# Patient Record
Sex: Female | Born: 1970
Health system: Southern US, Community
[De-identification: ages and names within clinical notes are randomized; demographics above are authoritative.]

## PROBLEM LIST (undated history)

## (undated) DIAGNOSIS — K8020 Calculus of gallbladder without cholecystitis without obstruction: Secondary | ICD-10-CM

## (undated) DIAGNOSIS — F419 Anxiety disorder, unspecified: Secondary | ICD-10-CM

## (undated) DIAGNOSIS — E785 Hyperlipidemia, unspecified: Secondary | ICD-10-CM

## (undated) DIAGNOSIS — F32A Depression, unspecified: Secondary | ICD-10-CM

## (undated) DIAGNOSIS — M1990 Unspecified osteoarthritis, unspecified site: Secondary | ICD-10-CM

## (undated) DIAGNOSIS — D649 Anemia, unspecified: Secondary | ICD-10-CM

## (undated) DIAGNOSIS — K219 Gastro-esophageal reflux disease without esophagitis: Secondary | ICD-10-CM

## (undated) DIAGNOSIS — I10 Essential (primary) hypertension: Secondary | ICD-10-CM

## (undated) DIAGNOSIS — T7840XA Allergy, unspecified, initial encounter: Secondary | ICD-10-CM

## (undated) DIAGNOSIS — R569 Unspecified convulsions: Secondary | ICD-10-CM

## (undated) HISTORY — DX: Unspecified osteoarthritis, unspecified site: M19.90

## (undated) HISTORY — DX: Gastro-esophageal reflux disease without esophagitis: K21.9

## (undated) HISTORY — DX: Hyperlipidemia, unspecified: E78.5

## (undated) HISTORY — DX: Anxiety disorder, unspecified: F41.9

## (undated) HISTORY — PX: WISDOM TOOTH EXTRACTION: SHX21

## (undated) HISTORY — PX: CHOLECYSTECTOMY: SHX55

## (undated) HISTORY — PX: TUBAL LIGATION: SHX77

## (undated) HISTORY — DX: Allergy, unspecified, initial encounter: T78.40XA

## (undated) HISTORY — DX: Depression, unspecified: F32.A

## (undated) HISTORY — DX: Unspecified convulsions: R56.9

---

## 1996-04-02 DIAGNOSIS — K8020 Calculus of gallbladder without cholecystitis without obstruction: Secondary | ICD-10-CM

## 1996-04-02 HISTORY — DX: Calculus of gallbladder without cholecystitis without obstruction: K80.20

## 2001-07-11 ENCOUNTER — Emergency Department (HOSPITAL_COMMUNITY): Admission: EM | Admit: 2001-07-11 | Discharge: 2001-07-11 | Payer: Self-pay | Admitting: Emergency Medicine

## 2001-11-02 ENCOUNTER — Emergency Department (HOSPITAL_COMMUNITY): Admission: EM | Admit: 2001-11-02 | Discharge: 2001-11-02 | Payer: Self-pay | Admitting: Emergency Medicine

## 2002-06-06 ENCOUNTER — Emergency Department (HOSPITAL_COMMUNITY): Admission: EM | Admit: 2002-06-06 | Discharge: 2002-06-06 | Payer: Self-pay | Admitting: Emergency Medicine

## 2002-08-22 ENCOUNTER — Emergency Department (HOSPITAL_COMMUNITY): Admission: EM | Admit: 2002-08-22 | Discharge: 2002-08-22 | Payer: Self-pay | Admitting: Emergency Medicine

## 2002-08-22 ENCOUNTER — Encounter: Payer: Self-pay | Admitting: Emergency Medicine

## 2003-04-28 ENCOUNTER — Emergency Department (HOSPITAL_COMMUNITY): Admission: EM | Admit: 2003-04-28 | Discharge: 2003-04-28 | Payer: Self-pay | Admitting: Emergency Medicine

## 2005-02-02 ENCOUNTER — Emergency Department (HOSPITAL_COMMUNITY): Admission: EM | Admit: 2005-02-02 | Discharge: 2005-02-02 | Payer: Self-pay | Admitting: Emergency Medicine

## 2005-09-05 ENCOUNTER — Emergency Department (HOSPITAL_COMMUNITY): Admission: EM | Admit: 2005-09-05 | Discharge: 2005-09-05 | Payer: Self-pay | Admitting: Emergency Medicine

## 2006-02-06 ENCOUNTER — Emergency Department (HOSPITAL_COMMUNITY): Admission: EM | Admit: 2006-02-06 | Discharge: 2006-02-06 | Payer: Self-pay | Admitting: Emergency Medicine

## 2006-08-13 ENCOUNTER — Emergency Department (HOSPITAL_COMMUNITY): Admission: EM | Admit: 2006-08-13 | Discharge: 2006-08-13 | Payer: Self-pay | Admitting: Family Medicine

## 2006-12-23 ENCOUNTER — Emergency Department (HOSPITAL_COMMUNITY): Admission: EM | Admit: 2006-12-23 | Discharge: 2006-12-23 | Payer: Self-pay | Admitting: Emergency Medicine

## 2006-12-24 ENCOUNTER — Emergency Department (HOSPITAL_COMMUNITY): Admission: EM | Admit: 2006-12-24 | Discharge: 2006-12-25 | Payer: Self-pay | Admitting: Emergency Medicine

## 2009-07-19 ENCOUNTER — Emergency Department (HOSPITAL_COMMUNITY): Admission: EM | Admit: 2009-07-19 | Discharge: 2009-07-19 | Payer: Self-pay | Admitting: Family Medicine

## 2009-07-20 ENCOUNTER — Encounter: Admission: RE | Admit: 2009-07-20 | Discharge: 2009-07-20 | Payer: Self-pay | Admitting: Internal Medicine

## 2011-01-11 LAB — I-STAT 8, (EC8 V) (CONVERTED LAB)
Acid-base deficit: 1
BUN: 12
Bicarbonate: 23.8
Chloride: 106
Glucose, Bld: 104 — ABNORMAL HIGH
HCT: 32 — ABNORMAL LOW
Hemoglobin: 10.9 — ABNORMAL LOW
Operator id: 277751
Potassium: 3.6
Sodium: 140
TCO2: 25
pCO2, Ven: 38.5 — ABNORMAL LOW
pH, Ven: 7.399 — ABNORMAL HIGH

## 2011-01-11 LAB — DIFFERENTIAL
Basophils Absolute: 0
Basophils Relative: 0
Eosinophils Absolute: 0.1
Eosinophils Relative: 2
Lymphocytes Relative: 24
Lymphs Abs: 1.7
Monocytes Absolute: 0.4
Monocytes Relative: 6
Neutro Abs: 4.9
Neutrophils Relative %: 68

## 2011-01-11 LAB — CBC
HCT: 29.6 — ABNORMAL LOW
Hemoglobin: 9.6 — ABNORMAL LOW
MCHC: 32.4
MCV: 81
Platelets: 241
RBC: 3.65 — ABNORMAL LOW
RDW: 14.9 — ABNORMAL HIGH
WBC: 7.2

## 2011-01-11 LAB — POCT I-STAT CREATININE
Creatinine, Ser: 0.9
Operator id: 277751

## 2011-03-08 ENCOUNTER — Emergency Department (HOSPITAL_COMMUNITY)
Admission: EM | Admit: 2011-03-08 | Discharge: 2011-03-09 | Disposition: A | Discharge status: Home / Self Care | Payer: Self-pay | Attending: Emergency Medicine | Admitting: Emergency Medicine

## 2011-03-08 DIAGNOSIS — R110 Nausea: Secondary | ICD-10-CM | POA: Insufficient documentation

## 2011-03-08 DIAGNOSIS — R059 Cough, unspecified: Secondary | ICD-10-CM | POA: Insufficient documentation

## 2011-03-08 DIAGNOSIS — IMO0001 Reserved for inherently not codable concepts without codable children: Secondary | ICD-10-CM | POA: Insufficient documentation

## 2011-03-08 DIAGNOSIS — F17200 Nicotine dependence, unspecified, uncomplicated: Secondary | ICD-10-CM | POA: Insufficient documentation

## 2011-03-08 DIAGNOSIS — R05 Cough: Secondary | ICD-10-CM | POA: Insufficient documentation

## 2011-03-08 DIAGNOSIS — J3489 Other specified disorders of nose and nasal sinuses: Secondary | ICD-10-CM | POA: Insufficient documentation

## 2011-03-08 DIAGNOSIS — R509 Fever, unspecified: Secondary | ICD-10-CM | POA: Insufficient documentation

## 2011-03-08 DIAGNOSIS — B9789 Other viral agents as the cause of diseases classified elsewhere: Secondary | ICD-10-CM | POA: Insufficient documentation

## 2011-03-08 DIAGNOSIS — H9209 Otalgia, unspecified ear: Secondary | ICD-10-CM | POA: Insufficient documentation

## 2011-03-08 DIAGNOSIS — R51 Headache: Secondary | ICD-10-CM | POA: Insufficient documentation

## 2011-03-08 DIAGNOSIS — B349 Viral infection, unspecified: Secondary | ICD-10-CM

## 2011-03-09 ENCOUNTER — Emergency Department (HOSPITAL_COMMUNITY): Payer: Self-pay

## 2011-03-09 ENCOUNTER — Encounter: Payer: Self-pay | Admitting: Emergency Medicine

## 2011-03-09 LAB — RAPID STREP SCREEN (MED CTR MEBANE ONLY): Streptococcus, Group A Screen (Direct): NEGATIVE

## 2011-03-09 LAB — D-DIMER, QUANTITATIVE: D-Dimer, Quant: 0.27 ug/mL-FEU (ref 0.00–0.48)

## 2011-03-09 MED ORDER — ACETAMINOPHEN 325 MG PO TABS
650.0000 mg | ORAL_TABLET | Freq: Once | ORAL | Status: AC
  Administered 2011-03-09: 650 mg via ORAL
  Filled 2011-03-09: qty 2

## 2011-03-09 MED ORDER — KETOROLAC TROMETHAMINE 60 MG/2ML IM SOLN
60.0000 mg | Freq: Once | INTRAMUSCULAR | Status: AC
  Administered 2011-03-09: 60 mg via INTRAMUSCULAR
  Filled 2011-03-09: qty 2

## 2011-03-09 NOTE — ED Provider Notes (Signed)
History     CSN: [id number] Arrival date & time: 03/08/2011 11:57 PM   First MD Initiated Contact with Patient 03/09/11 [phone]      Chief Complaint  Patient presents with  . Headache    (Consider location/radiation/quality/duration/timing/severity/associated sxs/prior treatment) HPI Comments: Patient presents with one day of body aches, headache, fever to 102, chills, bilateral ear pain, cough and congestion. The headache is gradual in onset and similar to previous headaches.  She denies any weakness, numbness or tingling. She did not get a flu shot.   She's had some mild nausea but no vomiting no abdominal pain, chest pain, shortness of breath. She denies any other medical problems.  The history is provided by the patient.    History reviewed. No pertinent past medical history.  History reviewed. No pertinent past surgical history.  No family history on file.  History  Substance Use Topics  . Smoking status: Current Everyday Smoker  . Smokeless tobacco: Not on file  . Alcohol Use: Yes    OB History    Grav Para Term Preterm Abortions TAB SAB Ect Mult Living                  Review of Systems  Constitutional: Positive for fever, activity change, appetite change and fatigue.  HENT: Positive for congestion, sore throat and rhinorrhea. Negative for neck pain and neck stiffness.   Eyes: Negative for visual disturbance.  Respiratory: Positive for cough. Negative for chest tightness and shortness of breath.   Cardiovascular: Negative for chest pain.  Gastrointestinal: Positive for nausea. Negative for abdominal pain.  Genitourinary: Negative for dysuria and hematuria.  Musculoskeletal: Positive for myalgias and arthralgias.  Neurological: Positive for headaches. Negative for dizziness, seizures and weakness.    Allergies  Review of patient's allergies indicates no known allergies.  Home Medications  No current outpatient prescriptions on file.  BP 113/75  Pulse 83   Temp(Src) 97.6 F (36.4 C) (Oral)  Resp 16  SpO2 98%  LMP 02/15/2011  Physical Exam  Constitutional: She is oriented to person, place, and time. She appears well-developed and well-nourished. No distress.  HENT:  Head: Normocephalic.  Right Ear: External ear normal.  Left Ear: External ear normal.  Mouth/Throat: Oropharynx is clear and moist. No oropharyngeal exudate.  Eyes: Conjunctivae are normal. Pupils are equal, round, and reactive to light.  Neck: Normal range of motion. Neck supple.       No meningismus  Cardiovascular: Normal rate, regular rhythm and normal heart sounds.   Pulmonary/Chest: Effort normal and breath sounds normal. No respiratory distress.  Abdominal: Soft. There is no tenderness. There is no rebound and no guarding.  Musculoskeletal: Normal range of motion. She exhibits no edema and no tenderness.  Neurological: She is alert and oriented to person, place, and time. No cranial nerve deficit.  Skin: Skin is warm.    ED Course  Procedures (including critical care time)   Labs Reviewed  RAPID STREP SCREEN  D-DIMER, QUANTITATIVE   Dg Chest 2 View  03/09/2011  *RADIOLOGY REPORT*  Clinical Data: Cough, headache, body aches, and chest pain and burning.  CHEST - 2 VIEW  Comparison: Chest radiograph performed 02/02/2005  Findings: The lungs are well-aerated and clear.  There is no evidence of focal opacification, pleural effusion or pneumothorax.  The heart is normal in size; the mediastinal contour is within normal limits.  No acute osseous abnormalities are seen.  Clips are noted within the right upper quadrant, reflecting prior cholecystectomy.  IMPRESSION: No acute cardiopulmonary process seen.  Original Report Authenticated By: Tonia Ghent, M.D.     1. Viral syndrome       MDM  Cough, congestion, body aches and headaches.  Nontoxic on exam with normal neurological exam and supple neck. Likely viral syndrome. We'll check d-dimer given tachycardia.  On  reassessment headaches and bodyaches improved. Normal neurological exam. We'll treat as viral syndrome with supportive care.  Heart rate and temperature improved with antipyretics. d-dimer negative  Glynn Octave, MD 03/09/11 [phone]

## 2011-03-09 NOTE — ED Notes (Signed)
PT. REPORTS HEADACHE , BILATERAL EAR ACHE , BODY ACHES AND PAIN ONSET TODAY WITH CHILLS AND FEVER.

## 2011-08-06 ENCOUNTER — Encounter (HOSPITAL_COMMUNITY): Payer: Self-pay | Admitting: Emergency Medicine

## 2011-08-06 ENCOUNTER — Emergency Department (HOSPITAL_COMMUNITY)
Admission: EM | Admit: 2011-08-06 | Discharge: 2011-08-07 | Disposition: A | Discharge status: Home / Self Care | Payer: BC Managed Care – PPO | Source: Ambulatory Visit | Attending: Emergency Medicine | Admitting: Emergency Medicine

## 2011-08-06 DIAGNOSIS — M79609 Pain in unspecified limb: Secondary | ICD-10-CM | POA: Insufficient documentation

## 2011-08-06 DIAGNOSIS — M79605 Pain in left leg: Secondary | ICD-10-CM

## 2011-08-06 LAB — CBC
HCT: 30.9 % — ABNORMAL LOW (ref 36.0–46.0)
Hemoglobin: 9.3 g/dL — ABNORMAL LOW (ref 12.0–15.0)
MCH: 21.6 pg — ABNORMAL LOW (ref 26.0–34.0)
MCHC: 30.1 g/dL (ref 30.0–36.0)
MCV: 71.7 fL — ABNORMAL LOW (ref 78.0–100.0)
Platelets: 267 10*3/uL (ref 150–400)
RBC: 4.31 MIL/uL (ref 3.87–5.11)
RDW: 18.2 % — ABNORMAL HIGH (ref 11.5–15.5)
WBC: 8.4 10*3/uL (ref 4.0–10.5)

## 2011-08-06 LAB — BASIC METABOLIC PANEL
CO2: 26 mEq/L (ref 19–32)
Calcium: 9.3 mg/dL (ref 8.4–10.5)
Chloride: 103 mEq/L (ref 96–112)
Creatinine, Ser: 0.93 mg/dL (ref 0.50–1.10)
Glucose, Bld: 93 mg/dL (ref 70–99)

## 2011-08-06 LAB — PROTIME-INR
INR: 0.99 (ref 0.00–1.49)
Prothrombin Time: 13.3 seconds (ref 11.6–15.2)

## 2011-08-06 MED ORDER — OXYCODONE-ACETAMINOPHEN 5-325 MG PO TABS: 2.0000 | ORAL_TABLET | Freq: Four times a day (QID) | ORAL | Status: DC | PRN

## 2011-08-06 MED ORDER — KETOROLAC TROMETHAMINE 30 MG/ML IJ SOLN
30.0000 mg | Freq: Once | INTRAMUSCULAR | Status: AC
  Administered 2011-08-06: 30 mg via INTRAVENOUS
  Filled 2011-08-06: qty 1

## 2011-08-06 MED ORDER — ACETAMINOPHEN 325 MG PO TABS: 650.0000 mg | ORAL_TABLET | ORAL | Status: DC | PRN

## 2011-08-06 MED ORDER — ZOLPIDEM TARTRATE 5 MG PO TABS: 5.0000 mg | ORAL_TABLET | Freq: Every evening | ORAL | Status: DC | PRN

## 2011-08-06 MED ORDER — DEXAMETHASONE SODIUM PHOSPHATE 10 MG/ML IJ SOLN
10.0000 mg | Freq: Once | INTRAMUSCULAR | Status: AC
  Administered 2011-08-06: 10 mg via INTRAVENOUS
  Filled 2011-08-06: qty 1

## 2011-08-06 MED ORDER — ONDANSETRON HCL 4 MG/2ML IJ SOLN
4.0000 mg | Freq: Four times a day (QID) | INTRAMUSCULAR | Status: DC | PRN
  Administered 2011-08-06: 4 mg via INTRAVENOUS
  Filled 2011-08-06: qty 2

## 2011-08-06 MED ORDER — ENOXAPARIN SODIUM 100 MG/ML ~~LOC~~ SOLN
1.0000 mg/kg | SUBCUTANEOUS | Status: AC
  Administered 2011-08-07: 95 mg via SUBCUTANEOUS
  Filled 2011-08-06: qty 1

## 2011-08-06 MED ORDER — MORPHINE SULFATE 4 MG/ML IJ SOLN: 4.0000 mg | INTRAMUSCULAR | Status: DC | PRN

## 2011-08-06 NOTE — ED Provider Notes (Signed)
History     CSN: [id number]  Arrival date & time 08/06/11  2013   First MD Initiated Contact with Patient 08/06/11 2305      Chief Complaint  Patient presents with  . Leg Pain    (Consider location/radiation/quality/duration/timing/severity/associated sxs/prior treatment) HPI Comments: Pt presents with 2 weeks of LLE pain that started as her lower back but has become solely leg pain.  The pain is constant, moderate, worse with standing and not associated with red flags for pathological back pain.  She has no numbness in the feet, no retention, no fever, no ca and pain is relieved some when she is off of her feet.  No hx of back pain, no inciting event.  Patient is a 41 y.o. female presenting with leg pain. The history is provided by the patient and the spouse.  Leg Pain  Pertinent negatives include no numbness.    History reviewed. No pertinent past medical history.  History reviewed. No pertinent past surgical history.  No family history on file.  History  Substance Use Topics  . Smoking status: Current Everyday Smoker  . Smokeless tobacco: Not on file  . Alcohol Use: Yes    OB History    Grav Para Term Preterm Abortions TAB SAB Ect Mult Living                  Review of Systems  Constitutional: Negative for fever and chills.  HENT: Negative for sore throat and neck pain.   Eyes: Negative for visual disturbance.  Respiratory: Negative for cough and shortness of breath.   Cardiovascular: Positive for leg swelling. Negative for chest pain.  Gastrointestinal: Negative for nausea, vomiting, abdominal pain and diarrhea.  Genitourinary: Negative for dysuria and frequency.  Musculoskeletal: Positive for back pain.  Skin: Negative for rash.  Neurological: Negative for weakness, numbness and headaches.  Hematological: Negative for adenopathy.  Psychiatric/Behavioral: Negative for behavioral problems.    Allergies  Shellfish allergy and Shellfish-derived  products  Home Medications   Current Outpatient Rx  Name Route Sig Dispense Refill  . IBUPROFEN 800 MG PO TABS Oral Take 1 tablet (800 mg total) by mouth 3 (three) times daily. 21 tablet 0  . PREDNISONE 10 MG PO TABS Oral Take 2 tablets (20 mg total) by mouth daily. 15 tablet 0    BP 115/68  Pulse 78  Temp(Src) 98.2 F (36.8 C) (Oral)  Resp 16  Ht 5\' 7"  (1.702 m)  Wt 206 lb (93.441 kg)  BMI 32.26 kg/m2  SpO2 99%  LMP 07/30/2011  Physical Exam  Nursing note and vitals reviewed. Constitutional: She appears well-developed and well-nourished. No distress.  HENT:  Head: Normocephalic and atraumatic.  Mouth/Throat: Oropharynx is clear and moist. No oropharyngeal exudate.  Eyes: Conjunctivae and EOM are normal. Pupils are equal, round, and reactive to light. Right eye exhibits no discharge. Left eye exhibits no discharge. No scleral icterus.  Neck: Normal range of motion. Neck supple. No JVD present. No thyromegaly present.  Cardiovascular: Normal rate, regular rhythm, normal heart sounds and intact distal pulses.  Exam reveals no gallop and no friction rub.   No murmur heard. Pulmonary/Chest: Effort normal and breath sounds normal. No respiratory distress. She has no wheezes. She has no rales.  Abdominal: Soft. Bowel sounds are normal. She exhibits no distension and no mass. There is no tenderness.  Musculoskeletal: Normal range of motion. She exhibits no edema and no tenderness.       Pain with extension of  the left lower extremity, pain with flexion at the hip with the leg in extension, relief with flexion of the knee  Lymphadenopathy:    She has no cervical adenopathy.  Neurological: She is alert. Coordination normal.       Neurologic exam:  Speech clear, pupils equal round reactive to light, extraocular movements intact  Normal peripheral visual fields Cranial nerves III through XII normal including no facial droop Follows commands, moves all extremities x4, normal strength  to bilateral upper and lower extremities at all major muscle groups including grip Sensation normal to light touch and pinprick Coordination intact, no limb ataxia, finger-nose-finger normal Rapid alternating movements normal No pronator drift Gait normal   Skin: Skin is warm and dry. No rash noted. No erythema.  Psychiatric: She has a normal mood and affect. Her behavior is normal.    ED Course  Procedures (including critical care time)  Labs Reviewed  CBC - Abnormal; Notable for the following:    Hemoglobin 9.3 (*)    HCT 30.9 (*)    MCV 71.7 (*)    MCH 21.6 (*)    RDW 18.2 (*)    All other components within normal limits  BASIC METABOLIC PANEL - Abnormal; Notable for the following:    GFR calc non Af Amer 76 (*)    GFR calc Af Amer 89 (*)    All other components within normal limits  PROTIME-INR  APTT   No results found.   1. Leg pain, left       MDM  Pt has some lower extremity pain but no signs of neurological deficit and no pathological red flags.  She has been given pain medication.  She has unilateral sx and no real back pain at  This time and no buttock pain, some concern exists for DVT given the 2 weeks of progressive pain with subjective swellign of the thigh (I don't see any and there is no edema or asymetry on my exam).  Doppler in the AM, CDU protocol initiated.   The pt was given opiate type medications while in the emergency dept.  The patient was instructed on the possible side effects and potential allergic reactions associated with said medications and agreed to their use.  I also instructed the patient not to perform certain activities after use of these medications such as driving a vehicle and performing child care.  They were instructed not to ingest alcohol or other medications that may cause excessive sleepiness, tranquilizers or CNS depressant medications.  They have expressed their understanding.  If the pt was given opiate medications for home by  prescription they were reminded of these precautions as well.  Change of shift - care signed out to Fayrene Helper PA-C and Dr. Maree Erie, MD 08/07/11 2337

## 2011-08-06 NOTE — ED Notes (Signed)
PT. REPORTS LEFT HIP , LEFT LEG , LOW BACK PAIN FOR 2 WEEKS , DENIES INJURY OR FALL , AMBULATORY.

## 2011-08-06 NOTE — ED Notes (Signed)
Message left at vascular lab for testing in a.m.

## 2011-08-07 DIAGNOSIS — M79609 Pain in unspecified limb: Secondary | ICD-10-CM

## 2011-08-07 DIAGNOSIS — M7989 Other specified soft tissue disorders: Secondary | ICD-10-CM

## 2011-08-07 MED ORDER — PREDNISONE 10 MG PO TABS: 20.0000 mg | ORAL_TABLET | Freq: Every day | ORAL | Status: DC

## 2011-08-07 MED ORDER — IBUPROFEN 800 MG PO TABS: 800.0000 mg | ORAL_TABLET | Freq: Three times a day (TID) | ORAL | Status: AC

## 2011-08-07 NOTE — Discharge Instructions (Signed)

## 2011-08-07 NOTE — ED Notes (Signed)
Pt. Alert and oriented, NAD noted 

## 2011-08-07 NOTE — ED Notes (Signed)
Patient transported to Venous Doppler.

## 2011-08-07 NOTE — ED Provider Notes (Signed)
Assumed patient care at 35:73 AM.  41 year old female presents with 2 weeks of left lower extremity pain. There were some concern exists for DVT.  DVT protocol initiated in the CDU. Patient currently awaits venous Doppler study this a.m.  9:46 AM Patient Information       Patient Name  Sex  DOB  SSN    Erika Larson, Erika Larson  Female  1970-06-16  [id number]             Progress Notes signed by Glendale Chard at 08/07/11 4098     Author:  Glendale Chard  Service:  (none)  Author Type:  Sonographer   Filed:  08/07/11 0824  Note Time:  08/07/11 0823          *PRELIMINARY RESULTS*  Vascular Ultrasound  Left lower extremity venous duplex has been completed. Preliminary findings: Left= No evidence of DVT or baker's cyst.  [name], RDMS  08/07/2011, 8:23 AM   Doppler study negative for DVT.  VSS.  Pt able to ambulate.  No signs of infection.  Plan to d/c with steroid taper course, and with NSAIDs.  Pt voice understanding.  Referral given.           Fayrene Helper, PA-C 08/07/11 [phone]

## 2011-08-07 NOTE — Progress Notes (Signed)
*  PRELIMINARY RESULTS* Vascular Ultrasound Left lower extremity venous duplex has been completed.  Preliminary findings: Left= No evidence of DVT or baker's cyst.  [name], RDMS 08/07/2011, 8:23 AM

## 2011-08-08 NOTE — Progress Notes (Signed)
Observation review is complete for 5/6 visit.

## 2012-10-13 ENCOUNTER — Other Ambulatory Visit: Payer: Self-pay | Admitting: *Deleted

## 2012-10-13 DIAGNOSIS — N6310 Unspecified lump in the right breast, unspecified quadrant: Secondary | ICD-10-CM

## 2012-10-14 ENCOUNTER — Ambulatory Visit (HOSPITAL_COMMUNITY): Payer: BC Managed Care – PPO

## 2012-10-15 ENCOUNTER — Telehealth (HOSPITAL_COMMUNITY): Payer: Self-pay | Admitting: *Deleted

## 2012-10-15 NOTE — Telephone Encounter (Signed)
Telephoned patient at home # and left message to return call to [facility] 

## 2012-10-22 ENCOUNTER — Other Ambulatory Visit: Payer: BC Managed Care – PPO

## 2012-12-19 ENCOUNTER — Encounter (HOSPITAL_COMMUNITY): Payer: Self-pay | Admitting: Emergency Medicine

## 2012-12-19 ENCOUNTER — Emergency Department (INDEPENDENT_AMBULATORY_CARE_PROVIDER_SITE_OTHER): Payer: Self-pay

## 2012-12-19 ENCOUNTER — Emergency Department (INDEPENDENT_AMBULATORY_CARE_PROVIDER_SITE_OTHER)
Admission: EM | Admit: 2012-12-19 | Discharge: 2012-12-19 | Disposition: A | Discharge status: Home / Self Care | Payer: Self-pay | Source: Home / Self Care | Attending: Family Medicine | Admitting: Family Medicine

## 2012-12-19 DIAGNOSIS — M47812 Spondylosis without myelopathy or radiculopathy, cervical region: Secondary | ICD-10-CM

## 2012-12-19 MED ORDER — CYCLOBENZAPRINE HCL 5 MG PO TABS: 5.0000 mg | ORAL_TABLET | Freq: Three times a day (TID) | ORAL | Status: DC | PRN

## 2012-12-19 MED ORDER — MELOXICAM 7.5 MG PO TABS: 7.5000 mg | ORAL_TABLET | Freq: Two times a day (BID) | ORAL | Status: DC

## 2012-12-19 NOTE — ED Provider Notes (Signed)
CSN: [id number]     Arrival date & time 12/19/12  1419 History   None    Chief Complaint  Patient presents with  . Shoulder Pain   (Consider location/radiation/quality/duration/timing/severity/associated sxs/prior Treatment) Patient is a 42 y.o. female presenting with shoulder pain. The history is provided by the patient.  Shoulder Pain This is a new problem. The current episode started more than 2 days ago. The problem has been gradually improving. Pertinent negatives include no chest pain and no abdominal pain. 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    History reviewed. No pertinent past medical history. History reviewed. No pertinent past surgical history. History reviewed. No pertinent family history. History  Substance Use Topics  . Smoking status: Current Every Day Smoker  . Smokeless tobacco: Not on file  . Alcohol Use: Yes   OB History   Grav Para Term Preterm Abortions TAB SAB Ect Mult Living                 Review of Systems  Constitutional: Negative.   HENT: Positive for neck pain.   Cardiovascular: Negative for chest pain.  Gastrointestinal: Negative for abdominal pain.  Musculoskeletal: Negative for back pain, joint swelling and gait problem.  Neurological: Negative for numbness.    Allergies  Shellfish allergy and Shellfish-derived products  Home Medications   Current Outpatient Rx  Name  Route  Sig  Dispense  Refill  . cyclobenzaprine (FLEXERIL) 5 MG tablet   Oral   Take 1 tablet (5 mg total) by mouth 3 (three) times daily as needed for muscle spasms.   30 tablet   0   . meloxicam (MOBIC) 7.5 MG tablet   Oral   Take 1 tablet (7.5 mg total) by mouth 2 (two) times daily after a meal.   60 tablet   0   . predniSONE (DELTASONE) 10 MG tablet   Oral   Take 2 tablets (20 mg total) by mouth daily.   15 tablet   0    BP 136/81  Pulse 73  Temp(Src) 97.9 F (36.6 C) (Oral)  Resp 20  SpO2 100%  LMP 11/21/2012 Physical Exam  Nursing  note and vitals reviewed. Constitutional: She is oriented to person, place, and time. She appears well-developed and well-nourished.  Neck: Muscular tenderness present. No spinous process tenderness present. No rigidity. Decreased range of motion present. No erythema present.    Neurological: She is alert and oriented to person, place, and time.  Skin: Skin is warm and dry.    ED Course  Procedures (including critical care time) Labs Review Labs Reviewed - No data to display Imaging Review Dg Cervical Spine Complete  12/19/2012   CLINICAL DATA:  Right shoulder pain  EXAM: CERVICAL SPINE  4+ VIEWS  COMPARISON:  None.  FINDINGS: Normal alignment of the cervical spine. The vertebral body heights are well preserved. Mild disc space narrowing and ventral endplate spurring is noted at C4-5 and C5-6. There is no fracture or subluxation identified. No radio-opaque foreign body or soft tissue calcifications.  IMPRESSION: 1. No acute findings.  2. Cervical spondylosis noted.   Electronically Signed   By: Signa Kell M.D.   On: 12/19/2012 16:28    MDM  X-rays reviewed and report per radiologist.     Linna Hoff, MD 12/19/12 1655

## 2012-12-19 NOTE — ED Notes (Signed)
C/o right shoulder pain since Wednesday.  Denies an injury.

## 2013-01-18 ENCOUNTER — Encounter (HOSPITAL_COMMUNITY): Payer: Self-pay | Admitting: Emergency Medicine

## 2013-01-18 ENCOUNTER — Emergency Department (INDEPENDENT_AMBULATORY_CARE_PROVIDER_SITE_OTHER)
Admission: EM | Admit: 2013-01-18 | Discharge: 2013-01-18 | Disposition: A | Discharge status: Home / Self Care | Payer: No Typology Code available for payment source | Source: Home / Self Care | Attending: Emergency Medicine | Admitting: Emergency Medicine

## 2013-01-18 DIAGNOSIS — K047 Periapical abscess without sinus: Secondary | ICD-10-CM

## 2013-01-18 MED ORDER — HYDROCODONE-ACETAMINOPHEN 5-325 MG PO TABS
2.0000 | ORAL_TABLET | Freq: Once | ORAL | Status: AC
  Administered 2013-01-18: 2 via ORAL

## 2013-01-18 MED ORDER — HYDROCODONE-ACETAMINOPHEN 5-325 MG PO TABS
ORAL_TABLET | ORAL | Status: AC
  Filled 2013-01-18: qty 2

## 2013-01-18 MED ORDER — PENICILLIN V POTASSIUM 500 MG PO TABS: 500.0000 mg | ORAL_TABLET | Freq: Three times a day (TID) | ORAL | Status: DC

## 2013-01-18 MED ORDER — METRONIDAZOLE 500 MG PO TABS: 500.0000 mg | ORAL_TABLET | Freq: Three times a day (TID) | ORAL | Status: DC

## 2013-01-18 MED ORDER — CEFTRIAXONE SODIUM 1 G IJ SOLR
INTRAMUSCULAR | Status: AC
  Filled 2013-01-18: qty 10

## 2013-01-18 MED ORDER — OXYCODONE-ACETAMINOPHEN 5-325 MG PO TABS: ORAL_TABLET | ORAL | Status: DC

## 2013-01-18 MED ORDER — LIDOCAINE HCL (PF) 1 % IJ SOLN
INTRAMUSCULAR | Status: AC
  Filled 2013-01-18: qty 5

## 2013-01-18 MED ORDER — CEFTRIAXONE SODIUM 1 G IJ SOLR
1.0000 g | Freq: Once | INTRAMUSCULAR | Status: AC
  Administered 2013-01-18: 1 g via INTRAMUSCULAR

## 2013-01-18 NOTE — ED Provider Notes (Signed)
Chief Complaint:  No chief complaint on file.   History of Present Illness:   Erika Larson is a 42 year old female who has had a two-day history of pain in her right, lower, first molar and swelling of her jaw externally. She has had some purulent drainage. She denies any difficulty swallowing or breathing. No swelling of the throat or the tongue. It hurts to chew on that side. The molar has been cracked and chipped in the past. It's been a while since she's been to see a dentist for her routine dental care. She denies any fever, chills, headache, ear, or eye pain. She has no neck pain or stiffness. She denies any chest pain or shortness of breath.  Review of Systems:  Other than noted above, the patient denies any of the following symptoms: Systemic:  No fever, chills,  Or sweats. ENT:  No headache, ear ache, sore throat, nasal congestion, facial pain, or swelling. Lymphatic:  No adenopathy. Lungs:  No coughing, wheezing or shortness of breath.  PMFSH:  Past medical history, family history, social history, meds, and allergies were reviewed. She is taking some meloxicam for right upper back pain and also blood pressure pill.  Physical Exam:   Vital signs:  LMP 11/21/2012 General:  Alert, oriented, in no distress. ENT:  TMs and canals normal.  Nasal mucosa normal. Mouth exam:  There is marketed swelling externally. She's not had any difficulty breathing. The right, lower, second molar is extremely decayed. There is swelling of the gingiva no collection of pus. The jaw is tender to palpation but not fluctuant. There is no swelling of the floor the mouth or throat. Airway is widely patent. She has other dental decay and a number of missing teeth. Neck:  No swelling or adenopathy. Lungs:  Breath sounds clear and equal bilaterally.  No wheezes, rales or rhonchi. Heart:  Regular rhythm.  No gallops or murmers. Skin:  Clear, warm and dry.   Course in Urgent Care Center:   Given Rocephin 1 g IM  and Norco 5/325 2 pills by mouth for pain.  Assessment:  The encounter diagnosis was Dental abscess.  There is no evidence of swelling of the floor the mouth at this point. She was cautioned to be on the lookout for any swelling of the tongue or the throat or difficulty breathing. She should get in to see a dentist as soon as possible.  Plan:   1.  Meds:  The following meds were prescribed:   New Prescriptions   METRONIDAZOLE (FLAGYL) 500 MG TABLET    Take 1 tablet (500 mg total) by mouth 3 (three) times daily.   OXYCODONE-ACETAMINOPHEN (PERCOCET) 5-325 MG PER TABLET    1 to 2 tablets every 6 hours as needed for pain.   PENICILLIN V POTASSIUM (VEETID) 500 MG TABLET    Take 1 tablet (500 mg total) by mouth 3 (three) times daily.    2.  Patient Education/Counseling:  The patient was given appropriate handouts, self care instructions, and instructed in symptomatic relief. Suggested sleeping with head of bed elevated and hot salt water mouthwash.   3.  Follow up:  The patient was told to follow up if no better in 3 to 4 days, if becoming worse in any way, and given some red flag symptoms such as difficulty swallowing or breathing which would prompt immediate return.  Follow up with a dentist as soon as posssible.     Reuben Likes, MD 01/18/13 1328

## 2013-01-18 NOTE — ED Notes (Signed)
Assessment per Dr. Keller. 

## 2013-01-19 ENCOUNTER — Emergency Department (INDEPENDENT_AMBULATORY_CARE_PROVIDER_SITE_OTHER)
Admission: EM | Admit: 2013-01-19 | Discharge: 2013-01-19 | Disposition: A | Discharge status: Home / Self Care | Payer: Self-pay | Source: Home / Self Care | Attending: Family Medicine | Admitting: Family Medicine

## 2013-01-19 ENCOUNTER — Encounter (HOSPITAL_COMMUNITY): Payer: Self-pay | Admitting: Emergency Medicine

## 2013-01-19 DIAGNOSIS — K089 Disorder of teeth and supporting structures, unspecified: Secondary | ICD-10-CM

## 2013-01-19 DIAGNOSIS — K0889 Other specified disorders of teeth and supporting structures: Secondary | ICD-10-CM

## 2013-01-19 MED ORDER — CEFTRIAXONE SODIUM 1 G IJ SOLR
1.0000 g | Freq: Once | INTRAMUSCULAR | Status: AC
  Administered 2013-01-19: 1 g via INTRAMUSCULAR

## 2013-01-19 MED ORDER — LIDOCAINE HCL (PF) 1 % IJ SOLN
INTRAMUSCULAR | Status: AC
  Filled 2013-01-19: qty 5

## 2013-01-19 MED ORDER — CEFTRIAXONE SODIUM 1 G IJ SOLR
INTRAMUSCULAR | Status: AC
  Filled 2013-01-19: qty 10

## 2013-01-19 NOTE — ED Notes (Signed)
Call from spouse earlier today to inquire if she should be rechecked , since was having a lot of swelling in face. Presents w significant swelling lower jaw, lower face

## 2013-01-19 NOTE — ED Provider Notes (Signed)
CSN: [id number]     Arrival date & time 01/19/13  1604 History   First MD Initiated Contact with Patient 01/19/13 1629     Chief Complaint  Patient presents with  . Oral Swelling   (Consider location/radiation/quality/duration/timing/severity/associated sxs/prior Treatment) Patient is a 42 y.o. female presenting with tooth pain. The history is provided by the patient. No language interpreter was used.  Dental Pain Location:  Lower Quality:  No pain Severity:  No pain Onset quality:  Sudden Timing:  Constant Progression:  Worsening Chronicity:  New Relieved by:  Nothing Worsened by:  Nothing tried Pt complains   History reviewed. No pertinent past medical history. History reviewed. No pertinent past surgical history. History reviewed. No pertinent family history. History  Substance Use Topics  . Smoking status: Current Every Day Smoker  . Smokeless tobacco: Not on file  . Alcohol Use: Yes   OB History   Grav Para Term Preterm Abortions TAB SAB Ect Mult Living                 Review of Systems  All other systems reviewed and are negative.    Allergies  Shellfish allergy and Shellfish-derived products  Home Medications   Current Outpatient Rx  Name  Route  Sig  Dispense  Refill  . cyclobenzaprine (FLEXERIL) 5 MG tablet   Oral   Take 1 tablet (5 mg total) by mouth 3 (three) times daily as needed for muscle spasms.   30 tablet   0   . meloxicam (MOBIC) 7.5 MG tablet   Oral   Take 1 tablet (7.5 mg total) by mouth 2 (two) times daily after a meal.   60 tablet   0   . metroNIDAZOLE (FLAGYL) 500 MG tablet   Oral   Take 1 tablet (500 mg total) by mouth 3 (three) times daily.   30 tablet   0   . oxyCODONE-acetaminophen (PERCOCET) 5-325 MG per tablet      1 to 2 tablets every 6 hours as needed for pain.   30 tablet   0   . penicillin v potassium (VEETID) 500 MG tablet   Oral   Take 1 tablet (500 mg total) by mouth 3 (three) times daily.   30 tablet    0   . predniSONE (DELTASONE) 10 MG tablet   Oral   Take 2 tablets (20 mg total) by mouth daily.   15 tablet   0    BP 138/95  Pulse 88  Temp(Src) 98 F (36.7 C) (Oral)  Resp 16  SpO2 100%  LMP 01/04/2013 Physical Exam  Nursing note and vitals reviewed. Constitutional: She is oriented to person, place, and time. She appears well-developed and well-nourished.  HENT:  Head: Normocephalic and atraumatic.  Mouth/Throat: Oropharynx is clear and moist.  Swollen gumline  No obvious abscess,  Swelling right face  Neurological: She is alert and oriented to person, place, and time. She has normal reflexes.  Skin: Skin is warm.  Psychiatric: She has a normal mood and affect.    ED Course  Procedures (including critical care time) Labs Review Labs Reviewed - No data to display Imaging Review No results found.  EKG Interpretation     Ventricular Rate:    PR Interval:    QRS Duration:   QT Interval:    QTC Calculation:   R Axis:     Text Interpretation:              MDM  1. Toothache    Pt given 2nd dosage of rocephin.   Pt advised to recheck here tomorrow if Dr. Mayford Knife can not see her.   Call Dr.  Mayford Knife in the am to be seen.    Lonia Skinner [place], PA-C 01/19/13 1746

## 2013-01-21 NOTE — ED Provider Notes (Signed)
Medical screening examination/treatment/procedure(s) were performed by resident physician or non-physician practitioner and as supervising physician I was immediately available for consultation/collaboration.   Trinh Sanjose DOUGLAS MD.   Jaquawn Saffran D Jeanine Caven, MD 01/21/13 2100 

## 2013-06-22 ENCOUNTER — Ambulatory Visit: Payer: Self-pay | Admitting: Gynecology

## 2013-07-10 ENCOUNTER — Ambulatory Visit: Payer: Self-pay | Admitting: Gynecology

## 2013-12-25 ENCOUNTER — Other Ambulatory Visit: Payer: Self-pay | Admitting: Physician Assistant

## 2013-12-25 ENCOUNTER — Other Ambulatory Visit (HOSPITAL_COMMUNITY)
Admission: RE | Admit: 2013-12-25 | Discharge: 2013-12-25 | Disposition: A | Discharge status: Home / Self Care | Payer: No Typology Code available for payment source | Source: Ambulatory Visit | Attending: Family Medicine | Admitting: Family Medicine

## 2013-12-25 DIAGNOSIS — Z124 Encounter for screening for malignant neoplasm of cervix: Secondary | ICD-10-CM | POA: Diagnosis not present

## 2013-12-28 LAB — CYTOLOGY - PAP

## 2014-03-02 ENCOUNTER — Encounter (HOSPITAL_COMMUNITY): Payer: Self-pay | Admitting: Emergency Medicine

## 2014-03-02 ENCOUNTER — Emergency Department (HOSPITAL_COMMUNITY): Payer: No Typology Code available for payment source

## 2014-03-02 ENCOUNTER — Emergency Department (HOSPITAL_COMMUNITY)
Admission: EM | Admit: 2014-03-02 | Discharge: 2014-03-03 | Disposition: A | Discharge status: Home / Self Care | Payer: No Typology Code available for payment source | Attending: Emergency Medicine | Admitting: Emergency Medicine

## 2014-03-02 DIAGNOSIS — Y9289 Other specified places as the place of occurrence of the external cause: Secondary | ICD-10-CM | POA: Diagnosis not present

## 2014-03-02 DIAGNOSIS — Y9389 Activity, other specified: Secondary | ICD-10-CM | POA: Insufficient documentation

## 2014-03-02 DIAGNOSIS — Y990 Civilian activity done for income or pay: Secondary | ICD-10-CM | POA: Insufficient documentation

## 2014-03-02 DIAGNOSIS — I10 Essential (primary) hypertension: Secondary | ICD-10-CM | POA: Insufficient documentation

## 2014-03-02 DIAGNOSIS — W228XXA Striking against or struck by other objects, initial encounter: Secondary | ICD-10-CM | POA: Insufficient documentation

## 2014-03-02 DIAGNOSIS — R52 Pain, unspecified: Secondary | ICD-10-CM

## 2014-03-02 DIAGNOSIS — S9032XA Contusion of left foot, initial encounter: Secondary | ICD-10-CM | POA: Insufficient documentation

## 2014-03-02 DIAGNOSIS — Z792 Long term (current) use of antibiotics: Secondary | ICD-10-CM | POA: Diagnosis not present

## 2014-03-02 DIAGNOSIS — Z720 Tobacco use: Secondary | ICD-10-CM | POA: Insufficient documentation

## 2014-03-02 DIAGNOSIS — Z791 Long term (current) use of non-steroidal anti-inflammatories (NSAID): Secondary | ICD-10-CM | POA: Diagnosis not present

## 2014-03-02 DIAGNOSIS — Z7952 Long term (current) use of systemic steroids: Secondary | ICD-10-CM | POA: Insufficient documentation

## 2014-03-02 DIAGNOSIS — S99922A Unspecified injury of left foot, initial encounter: Secondary | ICD-10-CM | POA: Diagnosis present

## 2014-03-02 HISTORY — DX: Essential (primary) hypertension: I10

## 2014-03-02 MED ORDER — HYDROCODONE-ACETAMINOPHEN 5-325 MG PO TABS
1.0000 | ORAL_TABLET | Freq: Once | ORAL | Status: AC
  Administered 2014-03-03: 1 via ORAL
  Filled 2014-03-02: qty 1

## 2014-03-02 NOTE — ED Notes (Signed)
Pt. injured her left foot at work [facility] ) yesterday , accidentally hit with a foot cart , no fall / ambulatory , pain worse when bearing weight on affected foot.

## 2014-03-02 NOTE — ED Provider Notes (Signed)
CSN: [id number]     Arrival date & time 03/02/14  2038 History  This chart was scribed for non-physician practitioner, Baron Sane, PA-C working with Carmin Muskrat, MD by Frederich Balding, ED scribe. This patient was seen in room TR11C/TR11C and the patient's care was started at 9:51 PM.   Chief Complaint  Patient presents with  . Foot Injury   The history is provided by the patient. No language interpreter was used.    HPI Comments: Erika Larson is a 43 y.o. female who presents to the Emergency Department complaining of left foot injury that occurred yesterday. Pt states she accidentally hit her foot with a food cart at work. Reports sudden onset pain that is worse with bearing weight. States she has mild swelling in her big toe. She has not done anything for her symptoms. Denies past injury or surgery to her foot.   Past Medical History  Diagnosis Date  . Hypertension    History reviewed. No pertinent past surgical history. No family history on file. History  Substance Use Topics  . Smoking status: Current Every Day Smoker  . Smokeless tobacco: Not on file  . Alcohol Use: Yes   OB History    No data available     Review of Systems  Musculoskeletal: Positive for joint swelling and arthralgias.  All other systems reviewed and are negative.  Allergies  Shellfish allergy and Shellfish-derived products  Home Medications   Prior to Admission medications   Medication Sig Start Date End Date Taking? Authorizing Provider  cyclobenzaprine (FLEXERIL) 5 MG tablet Take 1 tablet (5 mg total) by mouth 3 (three) times daily as needed for muscle spasms. 12/19/12   Billy Fischer, MD  HYDROcodone-acetaminophen (NORCO/VICODIN) 5-325 MG per tablet Take 1-2 tablets by mouth every 6 (six) hours as needed for severe pain. 03/03/14   Joury Allcorn L Jeremiah Tarpley, PA-C  ibuprofen (ADVIL,MOTRIN) 800 MG tablet Take 1 tablet (800 mg total) by mouth 3 (three) times daily. 03/03/14   Tianne Plott L  Malyna Budney, PA-C  meloxicam (MOBIC) 7.5 MG tablet Take 1 tablet (7.5 mg total) by mouth 2 (two) times daily after a meal. 12/19/12   Billy Fischer, MD  metroNIDAZOLE (FLAGYL) 500 MG tablet Take 1 tablet (500 mg total) by mouth 3 (three) times daily. 01/18/13   Harden Mo, MD  oxyCODONE-acetaminophen (PERCOCET) 5-325 MG per tablet 1 to 2 tablets every 6 hours as needed for pain. 01/18/13   Harden Mo, MD  penicillin v potassium (VEETID) 500 MG tablet Take 1 tablet (500 mg total) by mouth 3 (three) times daily. 01/18/13   Harden Mo, MD  predniSONE (DELTASONE) 10 MG tablet Take 2 tablets (20 mg total) by mouth daily. 08/07/11   Domenic Moras, PA-C   BP 151/94 mmHg  Pulse 65  Temp(Src) 99 F (37.2 C) (Oral)  Resp 20  Ht 5\' 6"  (1.676 m)  Wt 196 lb (88.905 kg)  BMI 31.65 kg/m2  SpO2 100%  LMP 02/09/2014   Physical Exam  Constitutional: She is oriented to person, place, and time. She appears well-developed and well-nourished. No distress.  HENT:  Head: Normocephalic and atraumatic.  Right Ear: External ear normal.  Left Ear: External ear normal.  Nose: Nose normal.  Mouth/Throat: Oropharynx is clear and moist.  Eyes: Conjunctivae are normal.  Neck: Normal range of motion. Neck supple.  Cardiovascular: Normal rate, regular rhythm, normal heart sounds and intact distal pulses.   Pulmonary/Chest: Effort normal and breath sounds normal.  No respiratory distress.  Abdominal: Soft.  Musculoskeletal: Normal range of motion.       Left ankle: Normal.       Left foot: There is tenderness and swelling. There is normal range of motion and no bony tenderness.  Neurological: She is alert and oriented to person, place, and time.  Skin: Skin is warm and dry. She is not diaphoretic.  Psychiatric: She has a normal mood and affect.  Nursing note and vitals reviewed.   ED Course  Procedures (including critical care time)  DIAGNOSTIC STUDIES: Oxygen Saturation is 99% on RA, normal by my  interpretation.    COORDINATION OF CARE: 9:52 PM-Discussed treatment plan which includes foot xray, ice and pain medication with pt at bedside and pt agreed to plan.   Labs Review Labs Reviewed - No data to display  Imaging Review No results found.   EKG Interpretation None      MDM   Final diagnoses:  Foot contusion, left, initial encounter    Filed Vitals:   03/03/14 0117  BP: 151/94  Pulse: 65  Temp: 99 F (37.2 C)  Resp: 20   Afebrile, NAD, non-toxic appearing, AAOx4.  Neurovascularly intact. Normal sensation. No evidence of compartment syndrome. Imaging shows no fracture. Directed pt to ice injury, take acetaminophen or ibuprofen for pain, and to elevate and rest the injury when possible. Ace wrap and crutches provided. Provided back to work note for Sunday. Return precautions discussed. Patient is agreeable to plan.  Patient is stable at time of discharge    I personally performed the services described in this documentation, which was scribed in my presence. The recorded information has been reviewed and is accurate.  Harlow Mares, PA-C 03/03/14 0135  Carmin Muskrat, MD 03/03/14 [phone]

## 2014-03-03 MED ORDER — IBUPROFEN 800 MG PO TABS: 800.0000 mg | ORAL_TABLET | Freq: Three times a day (TID) | ORAL | Status: DC

## 2014-03-03 MED ORDER — HYDROCODONE-ACETAMINOPHEN 5-325 MG PO TABS
1.0000 | ORAL_TABLET | Freq: Four times a day (QID) | ORAL | Status: DC | PRN
Start: 2014-03-03 — End: 2014-08-06

## 2014-03-03 NOTE — ED Notes (Signed)
Pt discharged by Zandra Abts

## 2014-03-03 NOTE — Discharge Instructions (Signed)
Please follow up with your primary care physician in 1-2 days. If you do not have one please call the [facility] number listed above. Your x-ray was negative for any broken bones, your pain is likely due to a contusion or sprain. Please take pain medication and/or muscle relaxants as prescribed and as needed for pain. Please do not drive on narcotic pain medication or on muscle relaxants. Please read all discharge instructions and return precautions.   Foot Contusion A foot contusion is a deep bruise to the foot. Contusions are the result of an injury that caused bleeding under the skin. The contusion may turn blue, purple, or yellow. Minor injuries will give you a painless contusion, but more severe contusions may stay painful and swollen for a few weeks. CAUSES  A foot contusion comes from a direct blow to that area, such as a heavy object falling on the foot. SYMPTOMS   Swelling of the foot.  Discoloration of the foot.  Tenderness or soreness of the foot. DIAGNOSIS  You will have a physical exam and will be asked about your history. You may need an X-ray of your foot to look for a broken bone (fracture).  TREATMENT  An elastic wrap may be recommended to support your foot. Resting, elevating, and applying cold compresses to your foot are often the best treatments for a foot contusion. Over-the-counter medicines may also be recommended for pain control. HOME CARE INSTRUCTIONS   Put ice on the injured area.  Put ice in a plastic bag.  Place a towel between your skin and the bag.  Leave the ice on for 15-20 minutes, 03-04 times a day.  Only take over-the-counter or prescription medicines for pain, discomfort, or fever as directed by your caregiver.  If told, use an elastic wrap as directed. This can help reduce swelling. You may remove the wrap for sleeping, showering, and bathing. If your toes become numb, cold, or blue, take the wrap off and reapply it more  loosely.  Elevate your foot with pillows to reduce swelling.  Try to avoid standing or walking while the foot is painful. Do not resume use until instructed by your caregiver. Then, begin use gradually. If pain develops, decrease use. Gradually increase activities that do not cause discomfort until you have normal use of your foot.  See your caregiver as directed. It is very important to keep all follow-up appointments in order to avoid any lasting problems with your foot, including long-term (chronic) pain. SEEK IMMEDIATE MEDICAL CARE IF:   You have increased redness, swelling, or pain in your foot.  Your swelling or pain is not relieved with medicines.  You have loss of feeling in your foot or are unable to move your toes.  Your foot turns cold or blue.  You have pain when you move your toes.  Your foot becomes warm to the touch.  Your contusion does not improve in 2 days. MAKE SURE YOU:   Understand these instructions.  Will watch your condition.  Will get help right away if you are not doing well or get worse. Document Released: 01/08/2006 Document Revised: 09/18/2011 Document Reviewed: 02/20/2011 [facility] Patient Information 2015 [place], [place]. This information is not intended to replace advice given to you by your health care provider. Make sure you discuss any questions you have with your health care provider.  Elastic Bandage and RICE Elastic bandages come in different shapes and sizes. They perform different functions. Your caregiver will help you  to decide what is best for your protection, recovery, or rehabilitation following an injury. The following are some general tips to help you use an elastic bandage.  Use the bandage as directed by the maker of the bandage you are using.  Do not wrap it too tight. This may cut off the circulation of the arm or leg below the bandage.  If part of your body beyond the bandage becomes blue, numb, or swollen, it is too tight.  Loosen the bandage as needed to prevent these problems.  See your caregiver or trainer if the bandage seems to be making your problems worse rather than better. Bandages may be a reminder to you that you have an injury. However, they provide very little support. The few pounds of support they provide are minor considering the pressure it takes to injure a joint or tear ligaments. Therefore, the joint will not be able to handle all of the wear and tear it could before the injury. The routine care of many injuries includes Rest, Ice, Compression, and Elevation (RICE).  Rest is required to allow your body to heal. Generally, routine activities can be resumed when comfortable. Injured tendons and bones take about 6 weeks to heal.  Icing the injury helps keep the swelling down and reduces pain. Do not apply ice directly to the skin. Put ice in a plastic bag. Place a towel between the skin and the bag. This will prevent frostbite to the skin. Apply ice bags to the injured area for 15-20 minutes, every 2 hours while awake. Do this for the first 24 to 48 hours, then as directed by your caregiver.  Compression helps keep swelling down, gives support, and helps with discomfort. If an elastic bandage has been applied today, it should be removed and reapplied every 3 to 4 hours. It should not be applied tightly, but firmly enough to keep swelling down. Watch fingers or toes for swelling, bluish discoloration, coldness, numbness, or increased pain. If any of these problems occur, remove the bandage and reapply it more loosely. If these problems persist, contact your caregiver.  Elevation helps reduce swelling and decreases pain. The injured area (arms, hands, legs, or feet) should be placed near to or above the heart (center of the chest) if able. Persistent pain and inability to use the injured area for more than 2 to 3 days are warning signs. You should see a caregiver for a follow-up visit as soon as possible.  Initially, a minor broken bone (hairline fracture) may not be seen on X-rays. It may take 7 to 10 days to finally show up. Continued pain and swelling show that further evaluation and/or X-rays are needed. Make a follow-up visit with your caregiver. A specialist in reading X-rays (radiologist) will read your X-rays again. Finding out the results of your test Not all test results are available during your visit. If your test results are not back during the visit, make an appointment with your caregiver to find out the results. Do not assume everything is normal if you have not heard from your caregiver or the medical facility. It is important for you to follow up on all of your test results. Document Released: 09/08/2001 Document Revised: 06/11/2011 Document Reviewed: 07/21/2007 [facility] Patient Information 2015 [place], [place]. This information is not intended to replace advice given to you by your health care provider. Make sure you discuss any questions you have with your health care provider.

## 2014-03-03 NOTE — Progress Notes (Signed)
Orthopedic Tech Progress Note Patient Details:  Erika Larson 1970/12/18 [id number]  Ortho Devices Type of Ortho Device: Crutches Ortho Device/Splint Interventions: Application   Katheren Shams 03/03/2014, 12:48 AM

## 2014-07-07 ENCOUNTER — Emergency Department (HOSPITAL_COMMUNITY): Payer: No Typology Code available for payment source

## 2014-07-07 ENCOUNTER — Encounter (HOSPITAL_COMMUNITY): Payer: Self-pay | Admitting: *Deleted

## 2014-07-07 ENCOUNTER — Other Ambulatory Visit: Payer: Self-pay

## 2014-07-07 ENCOUNTER — Emergency Department (HOSPITAL_COMMUNITY)
Admission: EM | Admit: 2014-07-07 | Discharge: 2014-07-08 | Disposition: A | Discharge status: Home / Self Care | Payer: No Typology Code available for payment source | Attending: Emergency Medicine | Admitting: Emergency Medicine

## 2014-07-07 DIAGNOSIS — Z791 Long term (current) use of non-steroidal anti-inflammatories (NSAID): Secondary | ICD-10-CM | POA: Insufficient documentation

## 2014-07-07 DIAGNOSIS — M62838 Other muscle spasm: Secondary | ICD-10-CM | POA: Insufficient documentation

## 2014-07-07 DIAGNOSIS — R079 Chest pain, unspecified: Secondary | ICD-10-CM | POA: Diagnosis present

## 2014-07-07 DIAGNOSIS — R0789 Other chest pain: Secondary | ICD-10-CM | POA: Insufficient documentation

## 2014-07-07 DIAGNOSIS — R0602 Shortness of breath: Secondary | ICD-10-CM | POA: Insufficient documentation

## 2014-07-07 DIAGNOSIS — I10 Essential (primary) hypertension: Secondary | ICD-10-CM | POA: Insufficient documentation

## 2014-07-07 DIAGNOSIS — Z79899 Other long term (current) drug therapy: Secondary | ICD-10-CM | POA: Diagnosis not present

## 2014-07-07 DIAGNOSIS — Z720 Tobacco use: Secondary | ICD-10-CM | POA: Insufficient documentation

## 2014-07-07 DIAGNOSIS — Z7952 Long term (current) use of systemic steroids: Secondary | ICD-10-CM | POA: Diagnosis not present

## 2014-07-07 DIAGNOSIS — Z792 Long term (current) use of antibiotics: Secondary | ICD-10-CM | POA: Diagnosis not present

## 2014-07-07 DIAGNOSIS — M79601 Pain in right arm: Secondary | ICD-10-CM | POA: Diagnosis not present

## 2014-07-07 LAB — COMPREHENSIVE METABOLIC PANEL
ALBUMIN: 3.8 g/dL (ref 3.5–5.2)
ALK PHOS: 74 U/L (ref 39–117)
ALT: 12 U/L (ref 0–35)
ANION GAP: 6 (ref 5–15)
AST: 18 U/L (ref 0–37)
BILIRUBIN TOTAL: 0.4 mg/dL (ref 0.3–1.2)
BUN: 11 mg/dL (ref 6–23)
CHLORIDE: 106 mmol/L (ref 96–112)
CO2: 25 mmol/L (ref 19–32)
Calcium: 8.8 mg/dL (ref 8.4–10.5)
Creatinine, Ser: 0.9 mg/dL (ref 0.50–1.10)
GFR calc Af Amer: >90 mL/min (ref >90)
GFR calc non Af Amer: 78 mL/min — ABNORMAL LOW (ref >90)
GLUCOSE: 116 mg/dL — AB (ref 70–99)
POTASSIUM: 3.2 mmol/L — AB (ref 3.5–5.1)
SODIUM: 137 mmol/L (ref 135–145)
Total Protein: 7.6 g/dL (ref 6.0–8.3)

## 2014-07-07 LAB — D-DIMER, QUANTITATIVE (NOT AT ARMC): D DIMER QUANT: 0.36 ug{FEU}/mL (ref 0.00–0.48)

## 2014-07-07 LAB — TROPONIN I

## 2014-07-07 MED ORDER — DIAZEPAM 5 MG PO TABS
5.0000 mg | ORAL_TABLET | Freq: Once | ORAL | Status: AC
  Administered 2014-07-07: 5 mg via ORAL
  Filled 2014-07-07: qty 1

## 2014-07-07 MED ORDER — HYDROCODONE-ACETAMINOPHEN 5-325 MG PO TABS
2.0000 | ORAL_TABLET | Freq: Once | ORAL | Status: AC
  Administered 2014-07-07: 2 via ORAL
  Filled 2014-07-07: qty 2

## 2014-07-07 NOTE — ED Provider Notes (Signed)
CSN: [id number]     Arrival date & time 07/07/14  2128 History   First MD Initiated Contact with Patient 07/07/14 2159     Chief Complaint  Patient presents with  . Numbness    (Consider location/radiation/quality/duration/timing/severity/associated sxs/prior Treatment) HPI Comments: Patient is a 44 year old female who presents to the emergency department for further evaluation of right upper extremity paresthesias with R sided chest pain x 6 days. Patient reports that her chest pain follows and is associated with onset of her R arm paresthesias. She states that she feels as though her R harm and hand are swelling shortly after her symptoms start. She reports them to be intermittent and without modifying factors. Patient has tried [place] for symptoms with little effect. She states that she mentioned these symptoms to her PCP 5 days ago who only changed her BP medications. She reports repetitive movements at work with a fair amount of heavy lifting; mostly on Wednesdays. She works in [employer] and has been on break for the past week. Patient endorses some associated shortness of breath. No associated fever, syncope, hemoptysis, neck trauma/injury, numbness, nausea, vomiting, or extremity weakness. No hx of DM, HLD, DVT/PE. No recent surgeries or hospitalizations.  The history is provided by the patient and the spouse. No language interpreter was used.    Past Medical History  Diagnosis Date  . Hypertension    History reviewed. No pertinent past surgical history. No family history on file. History  Substance Use Topics  . Smoking status: Current Every Day Smoker  . Smokeless tobacco: Not on file  . Alcohol Use: Yes   OB History    No data available      Review of Systems  Constitutional: Negative for fever.  Respiratory: Positive for shortness of breath.   Cardiovascular: Positive for chest pain.  Gastrointestinal: Negative for nausea and vomiting.  Musculoskeletal: Positive for  myalgias. Negative for neck pain and neck stiffness.  Neurological: Negative for syncope and numbness.       +RUE paresthesias  All other systems reviewed and are negative.   Allergies  Shellfish allergy and Shellfish-derived products  Home Medications   Prior to Admission medications   Medication Sig Start Date End Date Taking? Authorizing Provider  cyclobenzaprine (FLEXERIL) 5 MG tablet Take 1 tablet (5 mg total) by mouth 3 (three) times daily as needed for muscle spasms. 12/19/12   Billy Fischer, MD  HYDROcodone-acetaminophen (NORCO/VICODIN) 5-325 MG per tablet Take 1-2 tablets by mouth every 6 (six) hours as needed for severe pain. 03/03/14   Jennifer Piepenbrink, PA-C  ibuprofen (ADVIL,MOTRIN) 800 MG tablet Take 1 tablet (800 mg total) by mouth 3 (three) times daily. 03/03/14   Jennifer Piepenbrink, PA-C  meloxicam (MOBIC) 7.5 MG tablet Take 1 tablet (7.5 mg total) by mouth 2 (two) times daily after a meal. 12/19/12   Billy Fischer, MD  metroNIDAZOLE (FLAGYL) 500 MG tablet Take 1 tablet (500 mg total) by mouth 3 (three) times daily. 01/18/13   Harden Mo, MD  oxyCODONE-acetaminophen (PERCOCET) 5-325 MG per tablet 1 to 2 tablets every 6 hours as needed for pain. 01/18/13   Harden Mo, MD  penicillin v potassium (VEETID) 500 MG tablet Take 1 tablet (500 mg total) by mouth 3 (three) times daily. 01/18/13   Harden Mo, MD  predniSONE (DELTASONE) 10 MG tablet Take 2 tablets (20 mg total) by mouth daily. 08/07/11   Domenic Moras, PA-C   BP 137/96 mmHg  Pulse 90  Temp(Src) 97.8 F (36.6 C) (Oral)  Resp 16  Ht 5\' 6"  (1.676 m)  Wt 202 lb (91.627 kg)  BMI 32.62 kg/m2  SpO2 99%  LMP 07/07/2014   Physical Exam  Constitutional: She is oriented to person, place, and time. She appears well-developed and well-nourished. No distress.  Nontoxic/nonseptic appearing  HENT:  Head: Normocephalic and atraumatic.  Eyes: Conjunctivae and EOM are normal. No scleral icterus.  Neck: Normal range  of motion.  No bony deformities, step offs, or crepitus to the cervical midline. No JVD.  Cardiovascular: Normal rate, regular rhythm and normal heart sounds.   Pulmonary/Chest: Effort normal and breath sounds normal. No respiratory distress. She has no wheezes. She has no rales.  Respirations even and unlabored. Lungs CTAB.  Musculoskeletal: Normal range of motion. She exhibits tenderness.  TTP to R thoracic paraspinal muscles. No TTP to the thoracic midline. No bony deformities, step offs or crepitus. TTP also appreciated to the R forearm. No RUE or pitting edema noted.  Neurological: She is alert and oriented to person, place, and time. She exhibits normal muscle tone. Coordination normal.  Sensation to light touch intact in all extremities; patient reports subjective decreased sensation in her R hand. Finger to thumb opposition intact.  Skin: Skin is warm and dry. No rash noted. She is not diaphoretic. No erythema. No pallor.  Psychiatric: She has a normal mood and affect. Her behavior is normal.  Nursing note and vitals reviewed.   ED Course  Procedures (including critical care time) Labs Review Labs Reviewed  CBC WITH DIFFERENTIAL/PLATELET - Abnormal; Notable for the following:    Hemoglobin 8.3 (*)    HCT 28.9 (*)    MCV 65.8 (*)    MCH 18.9 (*)    MCHC 28.7 (*)    RDW 20.3 (*)    All other components within normal limits  COMPREHENSIVE METABOLIC PANEL - Abnormal; Notable for the following:    Potassium 3.2 (*)    Glucose, Bld 116 (*)    GFR calc non Af Amer 78 (*)    All other components within normal limits  TROPONIN I  D-DIMER, QUANTITATIVE    Imaging Review Dg Chest 2 View  07/07/2014   CLINICAL DATA:  Right-sided chest pain and swelling. Numbness and tightness in the right hand.  EXAM: CHEST  2 VIEW  COMPARISON:  03/09/2011  FINDINGS: The heart size and mediastinal contours are within normal limits. Both lungs are clear. The visualized skeletal structures are  unremarkable.  IMPRESSION: No active cardiopulmonary disease.   Electronically Signed   By: Lucienne Capers M.D.   On: 07/07/2014 22:23     EKG Interpretation None      MDM   Final diagnoses:  Muscle spasm  Right arm pain  Chest wall pain    44 year old female presents to the emergency department for further evaluation of right upper extremity paresthesias and swelling with associated right-sided chest wall discomfort. Tenderness is reproducible on palpation. Patient has no visible swelling or pitting edema of her right upper extremity. Her lungs are clear bilaterally. She is neurovascularly intact. 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   Symptoms atypical for DVT. Patient has a negative d-dimer making this unlikely. Doubt infectious etiology and patient has no leukocytosis. She does have an anemia which appears fairly stable compared to 2012. Patient reports being told by her primary care doctor that she is anemic. No signs of acute  blood loss; no tachycardia, hypotension, shortness of breath or hypoxia, or elevated BUN. Doubt cardiac etiology. EKG not in [facility]; did not cross over, but was visualized by myself to be nonischemic. No STEMI. Troponin is negative and chest x-ray is also unremarkable. No evidence of acute cardiopulmonary process. Doubt dissection and mediastinal contours are WNL. No electrolyte abnormalities. CT cervical spine obtained for further evaluation of symptoms. This shows chronic changes to C1 with no significant encroachment upon the foramen magnum. There are degenerative changes throughout the spine, but no evidence of acute fracture or dislocation.  Suspect that symptoms are musculoskeletal in origin, secondary to occupational overuse. Will manage symptoms as outpatient with pain medication, NSAIDs, and muscle relaxer. Patient advised to follow-up with her primary care doctor within 1 week for a recheck  of symptoms. Work note given so that patient may rest her right upper extremity. Return precautions discussed and provided. Patient agreeable to plan with no unaddressed concerns. Patient discharged in good condition; VSS.   Filed Vitals:   07/07/14 2311 07/07/14 2330 07/08/14 0044 07/08/14 0045  BP:  140/90 130/83 130/83  Pulse:  74 73 72  Temp: 98 F (36.7 C)     TempSrc:      Resp:  19 18   Height:      Weight:      SpO2:  100% 100% 100%     [name], PA-C 07/08/14 0422  Quintella Reichert, MD 07/12/14 [phone]

## 2014-07-07 NOTE — ED Notes (Signed)
Patient is currently in xray

## 2014-07-07 NOTE — ED Notes (Signed)
Claiborne Billings, pa-c, at the bedside

## 2014-07-07 NOTE — ED Notes (Signed)
Patient hasn't arrived to room yet.

## 2014-07-07 NOTE — ED Notes (Signed)
The phas had rt arm and shoulder numbness with some chest pain since last Thursday.  She has seen her doctor for the same .  She was given high bp meds

## 2014-07-07 NOTE — ED Notes (Signed)
Claiborne Billings, PA-C, still at the bedside.

## 2014-07-08 LAB — CBC WITH DIFFERENTIAL/PLATELET
Basophils Absolute: 0 10*3/uL (ref 0.0–0.1)
Basophils Relative: 0 % (ref 0–1)
EOS PCT: 4 % (ref 0–5)
Eosinophils Absolute: 0.3 10*3/uL (ref 0.0–0.7)
HCT: 28.9 % — ABNORMAL LOW (ref 36.0–46.0)
Hemoglobin: 8.3 g/dL — ABNORMAL LOW (ref 12.0–15.0)
Lymphocytes Relative: 26 % (ref 12–46)
Lymphs Abs: 2.1 10*3/uL (ref 0.7–4.0)
MCH: 18.9 pg — AB (ref 26.0–34.0)
MCHC: 28.7 g/dL — ABNORMAL LOW (ref 30.0–36.0)
MCV: 65.8 fL — ABNORMAL LOW (ref 78.0–100.0)
Monocytes Absolute: 0.4 10*3/uL (ref 0.1–1.0)
Monocytes Relative: 5 % (ref 3–12)
NEUTROS PCT: 65 % (ref 43–77)
Neutro Abs: 5.2 10*3/uL (ref 1.7–7.7)
PLATELETS: 180 10*3/uL (ref 150–400)
RBC: 4.39 MIL/uL (ref 3.87–5.11)
RDW: 20.3 % — ABNORMAL HIGH (ref 11.5–15.5)
WBC: 8 10*3/uL (ref 4.0–10.5)

## 2014-07-08 MED ORDER — FERROUS SULFATE 325 (65 FE) MG PO TABS: 325.0000 mg | ORAL_TABLET | Freq: Every day | ORAL | Status: DC

## 2014-07-08 MED ORDER — POTASSIUM CHLORIDE CRYS ER 20 MEQ PO TBCR
40.0000 meq | EXTENDED_RELEASE_TABLET | Freq: Once | ORAL | Status: AC
  Administered 2014-07-08: 40 meq via ORAL
  Filled 2014-07-08: qty 2

## 2014-07-08 MED ORDER — DIAZEPAM 5 MG PO TABS: 5.0000 mg | ORAL_TABLET | Freq: Two times a day (BID) | ORAL | Status: DC

## 2014-07-08 MED ORDER — MELOXICAM 7.5 MG PO TABS: 15.0000 mg | ORAL_TABLET | Freq: Every day | ORAL | Status: DC

## 2014-07-08 MED ORDER — OXYCODONE-ACETAMINOPHEN 5-325 MG PO TABS: 1.0000 | ORAL_TABLET | Freq: Four times a day (QID) | ORAL | Status: DC | PRN

## 2014-07-08 NOTE — Discharge Instructions (Signed)
Recommend Mobitz and Valium as prescribed for symptoms. You may take Percocet as prescribed for severe pain. Alternate ice and heat to areas of injury 3-4 times per day. Follow-up with your primary care doctor in 1 week.  Muscle Cramps and Spasms Muscle cramps and spasms occur when a muscle or muscles tighten and you have no control over this tightening (involuntary muscle contraction). They are a common problem and can develop in any muscle. The most common place is in the calf muscles of the leg. Both muscle cramps and muscle spasms are involuntary muscle contractions, but they also have differences:   Muscle cramps are sporadic and painful. They may last a few seconds to a quarter of an hour. Muscle cramps are often more forceful and last longer than muscle spasms.  Muscle spasms may or may not be painful. They may also last just a few seconds or much longer. CAUSES  It is uncommon for cramps or spasms to be due to a serious underlying problem. In many cases, the cause of cramps or spasms is unknown. Some common causes are:   Overexertion.   Overuse from repetitive motions (doing the same thing over and over).   Remaining in a certain position for a long period of time.   Improper preparation, form, or technique while performing a sport or activity.   Dehydration.   Injury.   Side effects of some medicines.   Abnormally low levels of the salts and ions in your blood (electrolytes), especially potassium and calcium. This could happen if you are taking water pills (diuretics) or you are pregnant.  Some underlying medical problems can make it more likely to develop cramps or spasms. These include, but are not limited to:   Diabetes.   Parkinson disease.   Hormone disorders, such as thyroid problems.   Alcohol abuse.   Diseases specific to muscles, joints, and bones.   Blood vessel disease where not enough blood is getting to the muscles.  HOME CARE INSTRUCTIONS     Stay well hydrated. Drink enough water and fluids to keep your urine clear or pale yellow.  It may be helpful to massage, stretch, and relax the affected muscle.  For tight or tense muscles, use a warm towel, heating pad, or hot shower water directed to the affected area.  If you are sore or have pain after a cramp or spasm, applying ice to the affected area may relieve discomfort.  Put ice in a plastic bag.  Place a towel between your skin and the bag.  Leave the ice on for 15-20 minutes, 03-04 times a day.  Medicines used to treat a known cause of cramps or spasms may help reduce their frequency or severity. Only take over-the-counter or prescription medicines as directed by your caregiver. SEEK MEDICAL CARE IF:  Your cramps or spasms get more severe, more frequent, or do not improve over time.  MAKE SURE YOU:   Understand these instructions.  Will watch your condition.  Will get help right away if you are not doing well or get worse. Document Released: 09/08/2001 Document Revised: 07/14/2012 Document Reviewed: 03/05/2012 [facility] Patient Information 2015 [place], [place]. This information is not intended to replace advice given to you by your health care provider. Make sure you discuss any questions you have with your health care provider. Chest Wall Pain Chest wall pain is pain in or around the bones and muscles of your chest. It may take up to 6 weeks to get better. It may  take longer if you must stay physically active in your work and activities.  CAUSES  Chest wall pain may happen on its own. However, it may be caused by:  A viral illness like the flu.  Injury.  Coughing.  Exercise.  Arthritis.  Fibromyalgia.  Shingles. HOME CARE INSTRUCTIONS   Avoid overtiring physical activity. Try not to strain or perform activities that cause pain. This includes any activities using your chest or your abdominal and side muscles, especially if heavy weights are used.  Put  ice on the sore area.  Put ice in a plastic bag.  Place a towel between your skin and the bag.  Leave the ice on for 15-20 minutes per hour while awake for the first 2 days.  Only take over-the-counter or prescription medicines for pain, discomfort, or fever as directed by your caregiver. SEEK IMMEDIATE MEDICAL CARE IF:   Your pain increases, or you are very uncomfortable.  You have a fever.  Your chest pain becomes worse.  You have new, unexplained symptoms.  You have nausea or vomiting.  You feel sweaty or lightheaded.  You have a cough with phlegm (sputum), or you cough up blood. MAKE SURE YOU:   Understand these instructions.  Will watch your condition.  Will get help right away if you are not doing well or get worse. Document Released: 03/19/2005 Document Revised: 06/11/2011 Document Reviewed: 11/13/2010 [facility] Patient Information 2015 [place], [place]. This information is not intended to replace advice given to you by your health care provider. Make sure you discuss any questions you have with your health care provider.

## 2014-07-08 NOTE — ED Notes (Signed)
Called [place], PA-C. Awaiting prescription for iron prior to discharge.

## 2014-07-16 ENCOUNTER — Other Ambulatory Visit: Payer: Self-pay | Admitting: Obstetrics & Gynecology

## 2014-08-06 ENCOUNTER — Inpatient Hospital Stay (HOSPITAL_COMMUNITY)
Admission: AD | Admit: 2014-08-06 | Discharge: 2014-08-06 | Disposition: A | Discharge status: Home / Self Care | Payer: No Typology Code available for payment source | Source: Ambulatory Visit | Attending: Obstetrics & Gynecology | Admitting: Obstetrics & Gynecology

## 2014-08-06 ENCOUNTER — Encounter (HOSPITAL_COMMUNITY): Payer: Self-pay | Admitting: *Deleted

## 2014-08-06 DIAGNOSIS — N921 Excessive and frequent menstruation with irregular cycle: Secondary | ICD-10-CM | POA: Diagnosis not present

## 2014-08-06 DIAGNOSIS — F17210 Nicotine dependence, cigarettes, uncomplicated: Secondary | ICD-10-CM | POA: Insufficient documentation

## 2014-08-06 DIAGNOSIS — I10 Essential (primary) hypertension: Secondary | ICD-10-CM | POA: Insufficient documentation

## 2014-08-06 DIAGNOSIS — N939 Abnormal uterine and vaginal bleeding, unspecified: Secondary | ICD-10-CM | POA: Diagnosis present

## 2014-08-06 LAB — CBC
HEMATOCRIT: 31.8 % — AB (ref 36.0–46.0)
HEMOGLOBIN: 10 g/dL — AB (ref 12.0–15.0)
MCH: 23.8 pg — ABNORMAL LOW (ref 26.0–34.0)
MCHC: 31.4 g/dL (ref 30.0–36.0)
MCV: 75.7 fL — AB (ref 78.0–100.0)
PLATELETS: 174 10*3/uL (ref 150–400)
RBC: 4.2 MIL/uL (ref 3.87–5.11)
WBC: 6.4 10*3/uL (ref 4.0–10.5)

## 2014-08-06 MED ORDER — MEGESTROL ACETATE 40 MG PO TABS: ORAL_TABLET | ORAL | Status: DC

## 2014-08-06 NOTE — MAU Provider Note (Signed)
Chief Complaint: Vaginal Bleeding   First Provider Initiated Contact with Patient 08/06/14 1658      SUBJECTIVE HPI: Erika Larson is a 44 y.o. G4P3 who presents to maternity admissions reporting onset of heavy vaginal bleeding in last 2 days.  She has hx of irregular heavy bleeding with hgb of 8.8 earlier this month in the office. She has plan for IUD with Dr Nelda Marseille to reduce bleeding but when her bleeding became heavy, soaking 1 pad/ hour for a few hours, she called the office and was told to come in for evaluation.  She denies abdominal pain, vaginal itching/burning, urinary symptoms, h/a, dizziness, n/v, or fever/chills.     Vaginal Bleeding The patient's primary symptoms include vaginal bleeding. This is a recurrent problem. The current episode started yesterday. The problem occurs constantly. The problem has been gradually worsening. The patient is experiencing no pain. She is not pregnant. Pertinent negatives include no abdominal pain, back pain, chills, constipation, diarrhea, dysuria, fever, frequency, headaches, nausea, urgency or vomiting. The vaginal bleeding is heavier than menses. She has been passing clots. She has not been passing tissue. Nothing aggravates the symptoms. She has tried NSAIDs for the symptoms. The treatment provided no relief. No, her partner does not have an STD. Her menstrual history has been irregular. Her past medical history is significant for menorrhagia and metrorrhagia. (Fibroid uterus)    Past Medical History  Diagnosis Date  . Hypertension    Past Surgical History  Procedure Laterality Date  . Tubal ligation     History   Social History  . Marital Status: Married    Spouse Name: N/A  . Number of Children: N/A  . Years of Education: N/A   Occupational History  . Not on file.   Social History Main Topics  . Smoking status: Current Every Day Smoker  . Smokeless tobacco: Not on file  . Alcohol Use: Yes  . Drug Use: No  . Sexual Activity:  Yes    Birth Control/ Protection: None   Other Topics Concern  . Not on file   Social History Narrative   No current facility-administered medications on file prior to encounter.   Current Outpatient Prescriptions on File Prior to Encounter  Medication Sig Dispense Refill  . meloxicam (MOBIC) 7.5 MG tablet Take 2 tablets (15 mg total) by mouth daily. 30 tablet 0  . oxyCODONE-acetaminophen (PERCOCET/ROXICET) 5-325 MG per tablet Take 1-2 tablets by mouth every 6 (six) hours as needed for severe pain. 20 tablet 0  . cyclobenzaprine (FLEXERIL) 5 MG tablet Take 1 tablet (5 mg total) by mouth 3 (three) times daily as needed for muscle spasms. (Patient not taking: Reported on 08/06/2014) 30 tablet 0  . diazepam (VALIUM) 5 MG tablet Take 1 tablet (5 mg total) by mouth 2 (two) times daily. (Patient not taking: Reported on 08/06/2014) 10 tablet 0  . ferrous sulfate 325 (65 FE) MG tablet Take 1 tablet (325 mg total) by mouth daily. (Patient not taking: Reported on 08/06/2014) 14 tablet 0  . HYDROcodone-acetaminophen (NORCO/VICODIN) 5-325 MG per tablet Take 1-2 tablets by mouth every 6 (six) hours as needed for severe pain. (Patient not taking: Reported on 08/06/2014) 10 tablet 0  . ibuprofen (ADVIL,MOTRIN) 800 MG tablet Take 1 tablet (800 mg total) by mouth 3 (three) times daily. (Patient not taking: Reported on 08/06/2014) 21 tablet 0  . metroNIDAZOLE (FLAGYL) 500 MG tablet Take 1 tablet (500 mg total) by mouth 3 (three) times daily. (Patient not taking:  Reported on 08/06/2014) 30 tablet 0  . penicillin v potassium (VEETID) 500 MG tablet Take 1 tablet (500 mg total) by mouth 3 (three) times daily. (Patient not taking: Reported on 08/06/2014) 30 tablet 0  . predniSONE (DELTASONE) 10 MG tablet Take 2 tablets (20 mg total) by mouth daily. (Patient not taking: Reported on 08/06/2014) 15 tablet 0  . [DISCONTINUED] Diphenhydramine-APAP, sleep, (GOODY PM PO) Take 1 packet by mouth every evening.     Allergies  Allergen  Reactions  . Shellfish Allergy Anaphylaxis and Swelling  . Shellfish-Derived Products Anaphylaxis and Swelling    Review of Systems  Constitutional: Negative for fever, chills and malaise/fatigue.  Eyes: Negative for blurred vision.  Respiratory: Negative for cough and shortness of breath.   Cardiovascular: Negative for chest pain.  Gastrointestinal: Negative for heartburn, nausea, vomiting, abdominal pain, diarrhea and constipation.  Genitourinary: Positive for vaginal bleeding and menorrhagia. Negative for dysuria, urgency and frequency.  Musculoskeletal: Negative.  Negative for back pain.  Neurological: Negative for dizziness and headaches.  Psychiatric/Behavioral: Negative for depression.    OBJECTIVE Blood pressure 128/85, pulse 90, resp. rate 20, height 5\' 5"  (1.651 m), weight 92.761 kg (204 lb 8 oz), last menstrual period 07/07/2014. GENERAL: Well-developed, well-nourished female in no acute distress.  EYES: normal sclera/conjunctiva; no lid-lag HENT: Atraumatic, normocephalic HEART: normal rate RESP: normal effort GI: Soft, non-tender MUSCULOSKELETAL: Normal ROM EXTREMITIES: Nontender, no edema NEURO/PSYCH: Alert and oriented, appropriate affect  GU: Cervix pink, visually closed, without lesion, moderate amount dark red bleeding, no clots noted, vaginal walls and external genitalia normal    LAB RESULTS Results for orders placed or performed during the hospital encounter of 08/06/14 (from the past 24 hour(s))  CBC     Status: Abnormal   Collection Time: 08/06/14  4:05 PM  Result Value Ref Range   WBC 6.4 4.0 - 10.5 K/uL   RBC 4.20 3.87 - 5.11 MIL/uL   Hemoglobin 10.0 (L) 12.0 - 15.0 g/dL   HCT 31.8 (L) 36.0 - 46.0 %   MCV 75.7 (L) 78.0 - 100.0 fL   MCH 23.8 (L) 26.0 - 34.0 pg   MCHC 31.4 30.0 - 36.0 g/dL   Platelets 174 150 - 400 K/uL   MAU MDM Ordered and reviewed results for CBC.  Pelvic exam done.  Discussed improved hgb result with pt, but need to slow  current heavy bleeding before hgb drops again. Rx for Megace to stop bleeding.  Pt to f/u with [facility] for further treatment.   ASSESSMENT 1. Menometrorrhagia     PLAN Consult Dr Nelda Marseille, reviewed history, assessment, labs. Discharge home with bleeding precautions Megace taper, 120 mg x 3 days, 80 mg x 3 days, then 40 mg daily F/U in office with Dr Aileen Fass to MAU as needed for emergencies    Medication List    STOP taking these medications        cyclobenzaprine 5 MG tablet  Commonly known as:  FLEXERIL     diazepam 5 MG tablet  Commonly known as:  VALIUM     ferrous sulfate 325 (65 FE) MG tablet     HYDROcodone-acetaminophen 5-325 MG per tablet  Commonly known as:  NORCO/VICODIN     ibuprofen 800 MG tablet  Commonly known as:  ADVIL,MOTRIN     metroNIDAZOLE 500 MG tablet  Commonly known as:  FLAGYL     penicillin v potassium 500 MG tablet  Commonly known as:  VEETID     predniSONE 10 MG  tablet  Commonly known as:  DELTASONE      TAKE these medications        Chlorpheniramine-PSE-Ibuprofen 2-30-200 MG Tabs  Take 1 tablet by mouth daily.     ferrous gluconate 324 MG tablet  Commonly known as:  FERGON  Take 324 mg by mouth 2 (two) times daily with a meal.     lisinopril-hydrochlorothiazide 10-12.5 MG per tablet  Commonly known as:  PRINZIDE,ZESTORETIC  Take 1 tablet by mouth daily.     megestrol 40 MG tablet  Commonly known as:  MEGACE  Take 3 tabs/day for 3 days, 2 tabs/day for 3 days, then 1 tab/day     meloxicam 7.5 MG tablet  Commonly known as:  MOBIC  Take 2 tablets (15 mg total) by mouth daily.     oxyCODONE-acetaminophen 5-325 MG per tablet  Commonly known as:  PERCOCET/ROXICET  Take 1-2 tablets by mouth every 6 (six) hours as needed for severe pain.           Follow-up Information    Schedule an appointment as soon as possible for a visit with Janyth Pupa, M, DO.   Specialty:  Obstetrics and Gynecology   Contact information:   [address] [place] [address] [place] Certified Nurse-Midwife 08/06/2014  5:11 PM

## 2014-08-06 NOTE — MAU Note (Signed)
Pt. Started bleeding yesterday, this morning she started having heavy bleeding and has been changing 1-2 pads every hour all day.

## 2014-08-06 NOTE — Discharge Instructions (Signed)
Menorrhagia  Menorrhagia is the medical term for when your menstrual periods are heavy or last longer than usual. With menorrhagia, every period you have may cause enough blood loss and cramping that you are unable to maintain your usual activities.  CAUSES   In some cases, the cause of heavy periods is unknown, but a number of conditions may cause menorrhagia. Common causes include:   A problem with the hormone-producing thyroid gland (hypothyroid).   Noncancerous growths in the uterus (polyps or fibroids).   An imbalance of the estrogen and progesterone hormones.   One of your ovaries not releasing an egg during one or more months.   Side effects of having an intrauterine device (IUD).   Side effects of some medicines, such as anti-inflammatory medicines or blood thinners.   A bleeding disorder that stops your blood from clotting normally.  SIGNS AND SYMPTOMS   During a normal period, bleeding lasts between 4 and 8 days. Signs that your periods are too heavy include:   You routinely have to change your pad or tampon every 1 or 2 hours because it is completely soaked.   You pass blood clots larger than 1 inch (2.5 cm) in size.   You have bleeding for more than 7 days.   You need to use pads and tampons at the same time because of heavy bleeding.   You need to wake up to change your pads or tampons during the night.   You have symptoms of anemia, such as tiredness, fatigue, or shortness of breath.  DIAGNOSIS   Your health care provider will perform a physical exam and ask you questions about your symptoms and menstrual history. Other tests may be ordered based on what the health care provider finds during the exam. These tests can include:   Blood tests. Blood tests are used to check if you are pregnant or have hormonal changes, a bleeding or thyroid disorder, low iron levels (anemia), or other problems.   Endometrial biopsy. Your health care provider takes a sample of tissue from the inside of your  uterus to be examined under a microscope.   Pelvic ultrasound. This test uses sound waves to make a picture of your uterus, ovaries, and vagina. The pictures can show if you have fibroids or other growths.   Hysteroscopy. For this test, your health care provider will use a small telescope to look inside your uterus.  Based on the results of your initial tests, your health care provider may recommend further testing.  TREATMENT   Treatment may not be needed. If it is needed, your health care provider may recommend treatment with one or more medicines first. If these do not reduce bleeding enough, a surgical treatment might be an option. The best treatment for you will depend on:    Whether you need to prevent pregnancy.   Your desire to have children in the future.   The cause and severity of your bleeding.   Your opinion and personal preference.   Medicines for menorrhagia may include:   Birth control methods that use hormones. These include the pill, skin patch, vaginal ring, shots that you get every 3 months, hormonal IUD, and implant. These treatments reduce bleeding during your menstrual period.   Medicines that thicken blood and slow bleeding.   Medicines that reduce swelling, such as ibuprofen.   Medicines that contain a synthetic hormone called progestin.    Medicines that make the ovaries stop working for a short time.     the lining of your uterus to reduce menstrual bleeding.  Operative hysteroscopy. This procedure uses a tiny tube with a light (hysteroscope) to view your uterine cavity and can help in the surgical removal of a polyp that may be causing heavy periods.  Endometrial ablation. Through various techniques, your health care  provider permanently destroys the entire lining of your uterus (endometrium). After endometrial ablation, most women have little or no menstrual flow. Endometrial ablation reduces your ability to become pregnant.  Endometrial resection. This surgical procedure uses an electrosurgical wire loop to remove the lining of the uterus. This procedure also reduces your ability to become pregnant.  Hysterectomy. Surgical removal of the uterus and cervix is a permanent procedure that stops menstrual periods. Pregnancy is not possible after a hysterectomy. This procedure requires anesthesia and hospitalization. HOME CARE INSTRUCTIONS   Only take over-the-counter or prescription medicines as directed by your health care provider. Take prescribed medicines exactly as directed. Do not change or switch medicines without consulting your health care provider.  Take any prescribed iron pills exactly as directed by your health care provider. Long-term heavy bleeding may result in low iron levels. Iron pills help replace the iron your body lost from heavy bleeding. Iron may cause constipation. If this becomes a problem, increase the bran, fruits, and roughage in your diet.  Do not take aspirin or medicines that contain aspirin 1 week before or during your menstrual period. Aspirin may make the bleeding worse.  If you need to change your sanitary pad or tampon more than once every 2 hours, stay in bed and rest as much as possible until the bleeding stops.  Eat well-balanced meals. Eat foods high in iron. Examples are leafy green vegetables, meat, liver, eggs, and whole grain breads and cereals. Do not try to lose weight until the abnormal bleeding has stopped and your blood iron level is back to normal. SEEK MEDICAL CARE IF:   You soak through a pad or tampon every 1 or 2 hours, and this happens every time you have a period.  You need to use pads and tampons at the same time because you are bleeding so much.  You  need to change your pad or tampon during the night.  You have a period that lasts for more than 8 days.  You pass clots bigger than 1 inch wide.  You have irregular periods that happen more or less often than once a month.  You feel dizzy or faint.  You feel very weak or tired.  You feel short of breath or feel your heart is beating too fast when you exercise.  You have nausea and vomiting or diarrhea while you are taking your medicine.  You have any problems that may be related to the medicine you are taking. SEEK IMMEDIATE MEDICAL CARE IF:   You soak through 4 or more pads or tampons in 2 hours.  You have any bleeding while you are pregnant. MAKE SURE YOU:   Understand these instructions.  Will watch your condition.  Will get help right away if you are not doing well or get worse. Document Released: 03/19/2005 Document Revised: 03/24/2013 Document Reviewed: 09/07/2012 [facility] Patient Information 2015 [place], [place]. This information is not intended to replace advice given to you by your health care provider. Make sure you discuss any questions you have with your health care provider. Metrorrhagia  Metrorrhagia is uterine bleeding at irregular intervals, especially between menstrual periods.  CAUSES   Dysfunctional uterine bleeding.  Uterine lining growing outside the uterus (endometriosis).  Embryo adhering to uterine wall (implantation).  Pregnancy growing in the fallopian tubes (ectopic pregnancy).  Miscarriage.  Menopause.  Cancer of the reproduction organs.  Certain drugs such as hormonal contraceptives.  Inherited bleeding disorders.  Trauma.  Uterine fibroids.  Sexually transmitted diseases (STDs).  Polycystic ovarian disease. DIAGNOSIS  A history will be taken.  A physical exam will be performed.  Other tests may include:  Blood tests.  A pregnancy test.  An ultrasound of the abdomen and pelvis.  A biopsy of the uterine  lining.  AMRI or CT scan of the abdomen and pelvis. TREATMENT Treatment will depend on the cause. HOME CARE INSTRUCTIONS   Take all medicines as directed by your caregiver. Do not change or switch medicines without talking to your caregiver.  Take all iron supplements exactly as directed by your caregiver. Iron supplements help to replace the iron your body loses from irregular bleeding.If you become constipated, increase the amount of fiber, fruits, and vegetables in your diet.  Do not take aspirin or medicines that contain aspirin for 1 week before your menstrual period or during your menstrual period. Aspirin may increase the bleeding.  Rest as much as possible if you change your sanitary pad or tampon more than once every 2 hours.  Eat well-balanced meals including foods high in iron, such as green leafy vegetables, red meat, liver, eggs, and whole-grain breads and cereals.  Do not try to lose weight until the abnormal bleeding is controlled and your blood iron level is back to normal. SEEK MEDICAL CARE IF:   You have nausea and vomiting, or you cannot keep foods down.  You feel dizzy or have diarrhea while taking medicine.  You have any problems that may be related to the medicine you are taking. SEEK IMMEDIATE MEDICAL CARE IF:   You have a fever.  You develop chills.  You become lightheaded or faint.  You need to change your sanitary pad or tampon more than once an hour.  Your bleeding becomesheavy.  You begin to pass clots or tissue. MAKE SURE YOU:   Understand these instructions.  Will watch your condition.  Will get help right away if you are not doing well or get worse. Document Released: 03/19/2005 Document Revised: 06/11/2011 Document Reviewed: 10/16/2010 [facility] Patient Information 2015 [place], [place]. This information is not intended to replace advice given to you by your health care provider. Make sure you discuss any questions you have with your  health care provider.

## 2014-08-25 ENCOUNTER — Inpatient Hospital Stay (HOSPITAL_COMMUNITY)
Admission: AD | Admit: 2014-08-25 | Discharge: 2014-08-26 | Discharge status: Against Medical Advice | Payer: No Typology Code available for payment source | Source: Ambulatory Visit | Attending: Obstetrics & Gynecology | Admitting: Obstetrics & Gynecology

## 2014-08-25 DIAGNOSIS — N921 Excessive and frequent menstruation with irregular cycle: Secondary | ICD-10-CM | POA: Insufficient documentation

## 2014-08-25 NOTE — MAU Note (Signed)
Pt reports she has been bleeding x 5 days off/on. States she has had abnormal bleeding and has been on meds for "a while". Had IUD placed on 05/10 to help with the bleeding also.

## 2014-08-26 ENCOUNTER — Encounter (HOSPITAL_COMMUNITY): Payer: Self-pay | Admitting: *Deleted

## 2014-08-26 DIAGNOSIS — N921 Excessive and frequent menstruation with irregular cycle: Secondary | ICD-10-CM | POA: Diagnosis not present

## 2014-08-26 LAB — CBC
HEMATOCRIT: 34.8 % — AB (ref 36.0–46.0)
Hemoglobin: 11.5 g/dL — ABNORMAL LOW (ref 12.0–15.0)
MCH: 26 pg (ref 26.0–34.0)
MCHC: 33 g/dL (ref 30.0–36.0)
MCV: 78.7 fL (ref 78.0–100.0)
Platelets: 247 10*3/uL (ref 150–400)
RBC: 4.42 MIL/uL (ref 3.87–5.11)
RDW: 24.5 % — ABNORMAL HIGH (ref 11.5–15.5)
WBC: 9.9 10*3/uL (ref 4.0–10.5)

## 2014-09-06 ENCOUNTER — Encounter (HOSPITAL_COMMUNITY): Payer: Self-pay

## 2014-09-06 ENCOUNTER — Inpatient Hospital Stay (HOSPITAL_COMMUNITY)
Admission: AD | Admit: 2014-09-06 | Discharge: 2014-09-07 | Disposition: A | Discharge status: Home / Self Care | Payer: No Typology Code available for payment source | Source: Ambulatory Visit | Attending: Family Medicine | Admitting: Family Medicine

## 2014-09-06 DIAGNOSIS — T8389XA Other specified complication of genitourinary prosthetic devices, implants and grafts, initial encounter: Secondary | ICD-10-CM | POA: Insufficient documentation

## 2014-09-06 DIAGNOSIS — I10 Essential (primary) hypertension: Secondary | ICD-10-CM | POA: Insufficient documentation

## 2014-09-06 DIAGNOSIS — N920 Excessive and frequent menstruation with regular cycle: Secondary | ICD-10-CM | POA: Diagnosis not present

## 2014-09-06 DIAGNOSIS — N939 Abnormal uterine and vaginal bleeding, unspecified: Secondary | ICD-10-CM | POA: Insufficient documentation

## 2014-09-06 DIAGNOSIS — F17210 Nicotine dependence, cigarettes, uncomplicated: Secondary | ICD-10-CM | POA: Insufficient documentation

## 2014-09-06 HISTORY — DX: Calculus of gallbladder without cholecystitis without obstruction: K80.20

## 2014-09-06 HISTORY — DX: Anemia, unspecified: D64.9

## 2014-09-06 NOTE — Discharge Instructions (Signed)
Levonorgestrel intrauterine device (IUD) What is this medicine? LEVONORGESTREL IUD (LEE voe nor jes trel) is a contraceptive (birth control) device. The device is placed inside the uterus by a healthcare professional. It is used to prevent pregnancy and can also be used to treat heavy bleeding that occurs during your period. Depending on the device, it can be used for 3 to 5 years. This medicine may be used for other purposes; ask your health care provider or pharmacist if you have questions. COMMON BRAND NAME(S): LILETTA, Mirena, Skyla What should I tell my health care provider before I take this medicine? They need to know if you have any of these conditions: -abnormal Pap smear -cancer of the breast, uterus, or cervix -diabetes -endometritis -genital or pelvic infection now or in the past -have more than one sexual partner or your partner has more than one partner -heart disease -history of an ectopic or tubal pregnancy -immune system problems -IUD in place -liver disease or tumor -problems with blood clots or take blood-thinners -use intravenous drugs -uterus of unusual shape -vaginal bleeding that has not been explained -an unusual or allergic reaction to levonorgestrel, other hormones, silicone, or polyethylene, medicines, foods, dyes, or preservatives -pregnant or trying to get pregnant -breast-feeding How should I use this medicine? This device is placed inside the uterus by a health care professional. Talk to your pediatrician regarding the use of this medicine in children. Special care may be needed. Overdosage: If you think you have taken too much of this medicine contact a poison control center or emergency room at once. NOTE: This medicine is only for you. Do not share this medicine with others. What if I miss a dose? This does not apply. What may interact with this medicine? Do not take this medicine with any of the following  medications: -amprenavir -bosentan -fosamprenavir This medicine may also interact with the following medications: -aprepitant -barbiturate medicines for inducing sleep or treating seizures -bexarotene -griseofulvin -medicines to treat seizures like carbamazepine, ethotoin, felbamate, oxcarbazepine, phenytoin, topiramate -modafinil -pioglitazone -rifabutin -rifampin -rifapentine -some medicines to treat HIV infection like atazanavir, indinavir, lopinavir, nelfinavir, tipranavir, ritonavir -St. John's wort -warfarin This list may not describe all possible interactions. Give your health care provider a list of all the medicines, herbs, non-prescription drugs, or dietary supplements you use. Also tell them if you smoke, drink alcohol, or use illegal drugs. Some items may interact with your medicine. What should I watch for while using this medicine? Visit your doctor or health care professional for regular check ups. See your doctor if you or your partner has sexual contact with others, becomes HIV positive, or gets a sexual transmitted disease. This product does not protect you against HIV infection (AIDS) or other sexually transmitted diseases. You can check the placement of the IUD yourself by reaching up to the top of your vagina with clean fingers to feel the threads. Do not pull on the threads. It is a good habit to check placement after each menstrual period. Call your doctor right away if you feel more of the IUD than just the threads or if you cannot feel the threads at all. The IUD may come out by itself. You may become pregnant if the device comes out. If you notice that the IUD has come out use a backup birth control method like condoms and call your health care provider. Using tampons will not change the position of the IUD and are okay to use during your period. What side effects may   I notice from receiving this medicine? Side effects that you should report to your doctor or  health care professional as soon as possible: -allergic reactions like skin rash, itching or hives, swelling of the face, lips, or tongue -fever, flu-like symptoms -genital sores -high blood pressure -no menstrual period for 6 weeks during use -pain, swelling, warmth in the leg -pelvic pain or tenderness -severe or sudden headache -signs of pregnancy -stomach cramping -sudden shortness of breath -trouble with balance, talking, or walking -unusual vaginal bleeding, discharge -yellowing of the eyes or skin Side effects that usually do not require medical attention (report to your doctor or health care professional if they continue or are bothersome): -acne -breast pain -change in sex drive or performance -changes in weight -cramping, dizziness, or faintness while the device is being inserted -headache -irregular menstrual bleeding within first 3 to 6 months of use -nausea This list may not describe all possible side effects. Call your doctor for medical advice about side effects. You may report side effects to FDA at 1-800-FDA-1088. Where should I keep my medicine? This does not apply. NOTE: This sheet is a summary. It may not cover all possible information. If you have questions about this medicine, talk to your doctor, pharmacist, or health care provider.  2015, Elsevier/Gold Standard. (2011-04-19 13:54:04)  

## 2014-09-06 NOTE — MAU Provider Note (Signed)
History     CSN: [id number]  Arrival date and time: 09/06/14 2214   First Provider Initiated Contact with Patient 09/06/14 2344      Chief Complaint  Patient presents with  . Vaginal Bleeding   HPI Comments: Erika Larson is a 44 y.o. G4P3 who presents today with presents today with vagnial bleeding. She had an IUD placed on 5/10 for heavy periods. She states that she has been bleeding since. She called her doctors office, but she could not pay the copay to return.   Vaginal Bleeding The patient's primary symptoms include vaginal bleeding. This is a new problem. The current episode started more than 1 month ago. The problem occurs constantly. The problem has been waxing and waning. The patient is experiencing no pain. Pertinent negatives include no abdominal pain, dysuria, fever, frequency, nausea, urgency or vomiting. The vaginal discharge was bloody. The vaginal bleeding is typical of menses. She has been passing clots (about the size of quarter ). Nothing aggravates the symptoms. She has tried nothing for the symptoms. She uses an IUD for contraception.    Past Medical History  Diagnosis Date  . Hypertension   . Anemia   . Gall bladder stones 1998    Past Surgical History  Procedure Laterality Date  . Tubal ligation    . Wisdom tooth extraction      No family history on file.  History  Substance Use Topics  . Smoking status: Current Every Day Smoker  . Smokeless tobacco: Not on file  . Alcohol Use: Yes    Allergies:  Allergies  Allergen Reactions  . Shellfish Allergy Anaphylaxis and Swelling  . Shellfish-Derived Products Anaphylaxis and Swelling    Prescriptions prior to admission  Medication Sig Dispense Refill Last Dose  . Chlorpheniramine-PSE-Ibuprofen 2-30-200 MG TABS Take 1 tablet by mouth daily.   08/06/2014 at Unknown time  . ferrous gluconate (FERGON) 324 MG tablet Take 324 mg by mouth 2 (two) times daily with a meal.   08/25/2014 at Unknown time  .  lisinopril-hydrochlorothiazide (PRINZIDE,ZESTORETIC) 10-12.5 MG per tablet Take 1 tablet by mouth daily.   08/25/2014 at Unknown time  . megestrol (MEGACE) 40 MG tablet Take 3 tabs/day for 3 days, 2 tabs/day for 3 days, then 1 tab/day 30 tablet 0 08/25/2014 at Unknown time  . meloxicam (MOBIC) 7.5 MG tablet Take 2 tablets (15 mg total) by mouth daily. 30 tablet 0 Past Month at Unknown time  . oxyCODONE-acetaminophen (PERCOCET/ROXICET) 5-325 MG per tablet Take 1-2 tablets by mouth every 6 (six) hours as needed for severe pain. 20 tablet 0 Past Month at Unknown time    Review of Systems  Constitutional: Negative for fever.  Gastrointestinal: Negative for nausea, vomiting and abdominal pain.  Genitourinary: Positive for vaginal bleeding. Negative for dysuria, urgency and frequency.   Physical Exam   Blood pressure 126/74, pulse 88, temperature 98.2 F (36.8 C), temperature source Oral, resp. rate 15, height 5\' 3"  (1.6 m), weight 89.812 kg (198 lb), last menstrual period 08/10/2014.  Physical Exam  Nursing note and vitals reviewed. Constitutional: She is oriented to person, place, and time. She appears well-developed and well-nourished. No distress.  Cardiovascular: Normal rate.   Respiratory: Effort normal.  GI: Soft. There is no tenderness. There is no rebound.  Genitourinary:   External: no lesion Vagina: small amount of blood seen  Cervix: pink, smooth, no CMT, IUD tail at the os  Uterus: NSSC Adnexa: NT   Neurological: She is alert and  oriented to person, place, and time.  Skin: Skin is warm and dry.  Psychiatric: She has a normal mood and affect.     MAU Course  Procedures  MDM   Assessment and Plan   1. Heavy menses due to IUD    DC home Comfort measures reviewed  Bleeding patterns with IUD discussed with the patient, and questions answered Bleeding precautions RX: none  Return to MAU as needed FU with OB as planned  Follow-up Information    Follow up with  [facility] [place].   Why:  If symptoms worsen   Contact information:   [address] [place] [phone]        Mathis Bud 09/06/2014, 11:44 PM

## 2014-09-06 NOTE — MAU Note (Signed)
Pt reports she has continued to have vaginal bleeding. Is changing a pad q 2 hours. States she has been bleeding since 05/10, the day after she had an IUD placed. Denies abd pain.

## 2014-09-07 DIAGNOSIS — N920 Excessive and frequent menstruation with regular cycle: Secondary | ICD-10-CM | POA: Diagnosis not present

## 2014-09-07 DIAGNOSIS — I10 Essential (primary) hypertension: Secondary | ICD-10-CM | POA: Diagnosis not present

## 2014-09-07 DIAGNOSIS — F17210 Nicotine dependence, cigarettes, uncomplicated: Secondary | ICD-10-CM | POA: Diagnosis not present

## 2014-09-07 DIAGNOSIS — T8389XA Other specified complication of genitourinary prosthetic devices, implants and grafts, initial encounter: Secondary | ICD-10-CM | POA: Diagnosis not present

## 2014-09-07 DIAGNOSIS — N939 Abnormal uterine and vaginal bleeding, unspecified: Secondary | ICD-10-CM | POA: Diagnosis present

## 2016-02-07 ENCOUNTER — Emergency Department (HOSPITAL_COMMUNITY)
Admission: EM | Admit: 2016-02-07 | Discharge: 2016-02-07 | Disposition: A | Discharge status: Home / Self Care | Payer: No Typology Code available for payment source | Attending: Emergency Medicine | Admitting: Emergency Medicine

## 2016-02-07 ENCOUNTER — Emergency Department (HOSPITAL_COMMUNITY): Payer: No Typology Code available for payment source

## 2016-02-07 ENCOUNTER — Encounter (HOSPITAL_COMMUNITY): Payer: Self-pay | Admitting: *Deleted

## 2016-02-07 DIAGNOSIS — S3992XA Unspecified injury of lower back, initial encounter: Secondary | ICD-10-CM | POA: Diagnosis present

## 2016-02-07 DIAGNOSIS — I10 Essential (primary) hypertension: Secondary | ICD-10-CM | POA: Diagnosis not present

## 2016-02-07 DIAGNOSIS — S39012A Strain of muscle, fascia and tendon of lower back, initial encounter: Secondary | ICD-10-CM

## 2016-02-07 DIAGNOSIS — X501XXA Overexertion from prolonged static or awkward postures, initial encounter: Secondary | ICD-10-CM | POA: Diagnosis not present

## 2016-02-07 DIAGNOSIS — Y990 Civilian activity done for income or pay: Secondary | ICD-10-CM | POA: Insufficient documentation

## 2016-02-07 DIAGNOSIS — F17200 Nicotine dependence, unspecified, uncomplicated: Secondary | ICD-10-CM | POA: Insufficient documentation

## 2016-02-07 DIAGNOSIS — Y939 Activity, unspecified: Secondary | ICD-10-CM | POA: Insufficient documentation

## 2016-02-07 DIAGNOSIS — Y929 Unspecified place or not applicable: Secondary | ICD-10-CM | POA: Insufficient documentation

## 2016-02-07 DIAGNOSIS — M62838 Other muscle spasm: Secondary | ICD-10-CM | POA: Diagnosis not present

## 2016-02-07 DIAGNOSIS — M62830 Muscle spasm of back: Secondary | ICD-10-CM

## 2016-02-07 MED ORDER — PREDNISONE 10 MG PO TABS: 20.0000 mg | ORAL_TABLET | Freq: Two times a day (BID) | ORAL | 0 refills | Status: DC

## 2016-02-07 MED ORDER — OXYCODONE-ACETAMINOPHEN 5-325 MG PO TABS
1.0000 | ORAL_TABLET | Freq: Once | ORAL | Status: AC
  Administered 2016-02-07: 1 via ORAL
  Filled 2016-02-07: qty 1

## 2016-02-07 MED ORDER — CYCLOBENZAPRINE HCL 10 MG PO TABS
10.0000 mg | ORAL_TABLET | Freq: Once | ORAL | Status: AC
  Administered 2016-02-07: 10 mg via ORAL
  Filled 2016-02-07: qty 1

## 2016-02-07 MED ORDER — CYCLOBENZAPRINE HCL 10 MG PO TABS: 10.0000 mg | ORAL_TABLET | Freq: Two times a day (BID) | ORAL | 0 refills | Status: DC | PRN

## 2016-02-07 NOTE — ED Triage Notes (Signed)
Pt reports lower back pain that is worse with walking. Pt states that this started last Tuesday. Denies any injuries. Pt reports arthritis in her spine.

## 2016-02-07 NOTE — ED Provider Notes (Signed)
[place] DEPT Provider Note   CSN: [id number] Arrival date & time: 02/07/16  1806  By signing my name below, I, Erika Larson, attest that this documentation has been prepared under the direction and in the presence of [facility] [place].  Electronically Signed: Reola Larson, ED Scribe. 02/07/16. 7:28 PM.  History   Chief Complaint Chief Complaint  Patient presents with  . Back Pain   The history is provided by the patient. No language interpreter was used.  Back Pain   This is a chronic problem. The current episode started more than 1 week ago. The problem occurs constantly. The problem has been gradually worsening. The pain is associated with no known injury. The pain is present in the lumbar spine. The quality of the pain is described as burning (and spasmotic). The pain is at a severity of 8/10. The pain is moderate. Exacerbated by: standing. Pertinent negatives include no chest pain, no abdominal pain, no bowel incontinence, no perianal numbness, no bladder incontinence, no paresthesias, no tingling and no weakness. Treatments tried: Tylenol. The treatment provided no relief. Risk factors include obesity.    HPI Comments: Erika Larson is a 45 y.o. female with a PMHx of HTN and anemia, who presents to the Emergency Department complaining of acute on chronic, constant bilateral lower back pain onset approximately 1 week ago. No radiation of pain, and she describes her pain as burning and spasmatic. Pt notes that she has a h/o spinal arthritis, and her pain today is similar, however, worse. No recent trauma to the back. She took Tylenol and has been applying hear with minimal relief of her symptoms. Her pain is exacerbated with long periods of weight bearing. She denies any chance of being pregnant, and currently has IUD placement. Denies unilateral weakness, bowel/bladder incontinence, saddle anaesthesia/paraesthesias, chest pain, abdominal pain, or any other associated  symptoms.   Past Medical History:  Diagnosis Date  . Anemia   . Gall bladder stones 1998  . Hypertension    There are no active problems to display for this patient.  Past Surgical History:  Procedure Laterality Date  . TUBAL LIGATION    . WISDOM TOOTH EXTRACTION     OB History    Gravida Para Term Preterm AB Living   4 3       3    SAB TAB Ectopic Multiple Live Births                 Home Medications    Prior to Admission medications   Medication Sig Start Date End Date Taking? Authorizing Provider  Chlorpheniramine-PSE-Ibuprofen 2-30-200 MG TABS Take 1 tablet by mouth daily.    Historical Provider, MD  cyclobenzaprine (FLEXERIL) 10 MG tablet Take 1 tablet (10 mg total) by mouth 2 (two) times daily as needed for muscle spasms. 02/07/16   [name], NP  ferrous gluconate (FERGON) 324 MG tablet Take 324 mg by mouth 2 (two) times daily with a meal.    Historical Provider, MD  lisinopril-hydrochlorothiazide (PRINZIDE,ZESTORETIC) 10-12.5 MG per tablet Take 1 tablet by mouth daily.    Historical Provider, MD  megestrol (MEGACE) 40 MG tablet Take 3 tabs/day for 3 days, 2 tabs/day for 3 days, then 1 tab/day 08/06/14   [name], CNM  meloxicam (MOBIC) 7.5 MG tablet Take 2 tablets (15 mg total) by mouth daily. 07/08/14   [name], PA-C  oxyCODONE-acetaminophen (PERCOCET/ROXICET) 5-325 MG per tablet Take 1-2 tablets by mouth every 6 (six) hours  as needed for severe pain. 07/08/14   [name], PA-C  predniSONE (DELTASONE) 10 MG tablet Take 2 tablets (20 mg total) by mouth 2 (two) times daily with a meal. 02/07/16   [name], NP    Family History No family history on file.  Social History Social History  Substance Use Topics  . Smoking status: Current Every Day Smoker  . Smokeless tobacco: Never Used  . Alcohol use Yes   Allergies   Shellfish allergy and Shellfish-derived products  Review of Systems Review of Systems  Cardiovascular: Negative for chest pain.    Gastrointestinal: Negative for abdominal pain and bowel incontinence.  Genitourinary: Negative for bladder incontinence.  Musculoskeletal: Positive for back pain.  Neurological: Negative for tingling, weakness and paresthesias.       Negative for saddle anaesthesia/paraesthesias. Negative for bowel/bladder incontinence.   All other systems reviewed and are negative.  Physical Exam Updated Vital Signs BP 136/87 (BP Location: Right Arm)   Pulse 80   Temp 98.2 F (36.8 C) (Oral)   Resp 16   Ht 5\' 6"  (1.676 m)   Wt 92.1 kg   SpO2 100%   BMI 32.77 kg/m   Physical Exam  Constitutional: She appears well-developed and well-nourished.  HENT:  Head: Normocephalic.  Eyes: Conjunctivae are normal.  Neck: Normal range of motion. Neck supple.  Cardiovascular: Normal rate and regular rhythm.   Pulses:      Radial pulses are 2+ on the right side, and 2+ on the left side.  Pulmonary/Chest: Effort normal and breath sounds normal.  Abdominal: Soft. Bowel sounds are normal. There is no tenderness. There is no CVA tenderness.  Musculoskeletal: She exhibits tenderness.  Muscle spasms noted to the bilateral paraspinal lumbar musculature. Midline spine in otherwise non-tender, and exhibits no step offs, crepitus, or deformity.   Neurological: She is alert.  Reflex Scores:      Tricep reflexes are 2+ on the right side and 2+ on the left side.      Bicep reflexes are 2+ on the right side and 2+ on the left side.      Brachioradialis reflexes are 2+ on the right side and 2+ on the left side.      Patellar reflexes are 2+ on the right side and 2+ on the left side. Grip strength is 5/5 and equal bilaterally. Steady gait, no foot drops.   Skin: Skin is warm and dry.  Psychiatric: She has a normal mood and affect. Her behavior is normal.  Nursing note and vitals reviewed.  ED Treatments / Results  DIAGNOSTIC STUDIES: Oxygen Saturation is 100% on RA, normal by my interpretation.   COORDINATION OF  CARE: 7:25 PM-Discussed next steps with pt. Pt verbalized understanding and is agreeable with the plan.   Labs (all labs ordered are listed, but only abnormal results are displayed) Labs Reviewed - No data to display Radiology Dg Lumbar Spine Complete  Result Date: 02/07/2016 CLINICAL DATA:  Low back pain, no known injury, initial encounter EXAM: LUMBAR SPINE - COMPLETE 4+ VIEW COMPARISON:  None. FINDINGS: Four non rib-bearing lumbar vertebra are noted. Tubal body height is well maintained. No pars defects are seen. No anterolisthesis is noted. No soft tissue abnormality is seen. Mild osteophytic changes are seen. IMPRESSION: Mild degenerative change without acute abnormality. Electronically Signed   By: Inez Catalina M.D.   On: 02/07/2016 20:41    Procedures Procedures   Medications Ordered in ED Medications  cyclobenzaprine (FLEXERIL) tablet 10 mg (  10 mg Oral Given 02/07/16 2006)  oxyCODONE-acetaminophen (PERCOCET/ROXICET) 5-325 MG per tablet 1 tablet (1 tablet Oral Given 02/07/16 2118)   Initial Impression / Assessment and Plan / ED Course  I have reviewed the triage vital signs and the nursing notes.  Pertinent imaging results that were available during my care of the patient were reviewed by me and considered in my medical decision making (see chart for details).  Clinical Course    Patient is a 45yo female who presents to the ED with back pain. No neurological deficits appreciated. Patient is ambulatory. No warning symptoms of back pain including: fecal incontinence, urinary retention or overflow incontinence, night sweats, waking from sleep with back pain, unexplained fevers or weight loss, h/o cancer, IVDU, recent trauma. No concern for cauda equina, epidural abscess, or other serious cause of back pain. Conservative measures such as rest, ice/heat and pain medicine indicated with PCP follow-up if no improvement with conservative management.   Final Clinical Impressions(s) / ED  Diagnoses   Final diagnoses:  Muscle spasm of back  Strain of lumbar region, initial encounter   New Prescriptions Discharge Medication List as of 02/07/2016  9:45 PM    START taking these medications   Details  cyclobenzaprine (FLEXERIL) 10 MG tablet Take 1 tablet (10 mg total) by mouth 2 (two) times daily as needed for muscle spasms., Starting Tue 02/07/2016, Print    predniSONE (DELTASONE) 10 MG tablet Take 2 tablets (20 mg total) by mouth 2 (two) times daily with a meal., Starting Tue 02/07/2016, Print       I personally performed the services described in this documentation, which was scribed in my presence. The recorded information has been reviewed and is accurate.     [address] [place], NP 02/08/16 0148    Tanna Furry, MD 02/28/16 [phone]

## 2016-02-07 NOTE — ED Notes (Signed)
Patient in Xray at this time.

## 2016-02-07 NOTE — ED Notes (Addendum)
Pt presents with 1 week of lower back pain concentrated in the middle.  Pt stands for work all day.  Sts she has had an episode of numbness/tingling in L leg today and hand last night.  Sts pain makes her fatigued and laying down or sitting helps.  Using heat at home, does not help.

## 2016-05-25 ENCOUNTER — Encounter (HOSPITAL_COMMUNITY): Payer: Self-pay | Admitting: Emergency Medicine

## 2016-05-25 DIAGNOSIS — F17200 Nicotine dependence, unspecified, uncomplicated: Secondary | ICD-10-CM | POA: Insufficient documentation

## 2016-05-25 DIAGNOSIS — I10 Essential (primary) hypertension: Secondary | ICD-10-CM | POA: Insufficient documentation

## 2016-05-25 DIAGNOSIS — N9489 Other specified conditions associated with female genital organs and menstrual cycle: Secondary | ICD-10-CM | POA: Diagnosis not present

## 2016-05-25 DIAGNOSIS — D649 Anemia, unspecified: Secondary | ICD-10-CM | POA: Insufficient documentation

## 2016-05-25 DIAGNOSIS — R51 Headache: Secondary | ICD-10-CM | POA: Insufficient documentation

## 2016-05-25 DIAGNOSIS — Z79899 Other long term (current) drug therapy: Secondary | ICD-10-CM | POA: Diagnosis not present

## 2016-05-25 LAB — BASIC METABOLIC PANEL
Anion gap: 5 (ref 5–15)
BUN: 12 mg/dL (ref 6–20)
CALCIUM: 8.9 mg/dL (ref 8.9–10.3)
CO2: 26 mmol/L (ref 22–32)
Chloride: 105 mmol/L (ref 101–111)
Creatinine, Ser: 0.95 mg/dL (ref 0.44–1.00)
GFR calc Af Amer: >60 mL/min (ref >60)
GFR calc non Af Amer: >60 mL/min (ref >60)
GLUCOSE: 112 mg/dL — AB (ref 65–99)
Potassium: 3.4 mmol/L — ABNORMAL LOW (ref 3.5–5.1)
Sodium: 136 mmol/L (ref 135–145)

## 2016-05-25 NOTE — ED Triage Notes (Signed)
Pt presents to ED for assessment of headache x 2 days.  C/o nausea with headache.  Denies any other related symptoms.  Pt has hx of same, has not been able to take her BP meds for almost 1 year.  Patient also c/o of feeling run down.  Pt has also been off of her iron pills.

## 2016-05-26 ENCOUNTER — Emergency Department (HOSPITAL_COMMUNITY)
Admission: EM | Admit: 2016-05-26 | Discharge: 2016-05-26 | Disposition: A | Discharge status: Home / Self Care | Payer: BLUE CROSS/BLUE SHIELD | Attending: Emergency Medicine | Admitting: Emergency Medicine

## 2016-05-26 DIAGNOSIS — R51 Headache: Secondary | ICD-10-CM

## 2016-05-26 DIAGNOSIS — D649 Anemia, unspecified: Secondary | ICD-10-CM

## 2016-05-26 DIAGNOSIS — R519 Headache, unspecified: Secondary | ICD-10-CM

## 2016-05-26 LAB — CBC WITH DIFFERENTIAL/PLATELET
Basophils Absolute: 0 10*3/uL (ref 0.0–0.1)
Basophils Relative: 0 %
EOS PCT: 2 %
Eosinophils Absolute: 0.1 10*3/uL (ref 0.0–0.7)
HEMATOCRIT: 26.6 % — AB (ref 36.0–46.0)
Hemoglobin: 7.5 g/dL — ABNORMAL LOW (ref 12.0–15.0)
Lymphocytes Relative: 23 %
Lymphs Abs: 1.7 10*3/uL (ref 0.7–4.0)
MCH: 18.7 pg — AB (ref 26.0–34.0)
MCHC: 28.2 g/dL — ABNORMAL LOW (ref 30.0–36.0)
MCV: 66.2 fL — AB (ref 78.0–100.0)
MONOS PCT: 4 %
Monocytes Absolute: 0.3 10*3/uL (ref 0.1–1.0)
NEUTROS PCT: 71 %
Neutro Abs: 5.3 10*3/uL (ref 1.7–7.7)
PLATELETS: 247 10*3/uL (ref 150–400)
RBC: 4.02 MIL/uL (ref 3.87–5.11)
RDW: 19 % — ABNORMAL HIGH (ref 11.5–15.5)
WBC: 7.4 10*3/uL (ref 4.0–10.5)

## 2016-05-26 LAB — HCG, QUANTITATIVE, PREGNANCY: HCG, BETA CHAIN, QUANT, S: 1 m[IU]/mL (ref <5)

## 2016-05-26 LAB — POC OCCULT BLOOD, ED: FECAL OCCULT BLD: NEGATIVE

## 2016-05-26 MED ORDER — LISINOPRIL 5 MG PO TABS: 5.0000 mg | ORAL_TABLET | Freq: Every day | ORAL | 0 refills | Status: DC

## 2016-05-26 MED ORDER — METOCLOPRAMIDE HCL 5 MG/ML IJ SOLN
10.0000 mg | Freq: Once | INTRAMUSCULAR | Status: AC
  Administered 2016-05-26: 10 mg via INTRAVENOUS
  Filled 2016-05-26: qty 2

## 2016-05-26 MED ORDER — FERROUS GLUCONATE 324 (38 FE) MG PO TABS: 324.0000 mg | ORAL_TABLET | Freq: Two times a day (BID) | ORAL | 0 refills | Status: DC

## 2016-05-26 MED ORDER — SODIUM CHLORIDE 0.9 % IV BOLUS (SEPSIS)
1000.0000 mL | Freq: Once | INTRAVENOUS | Status: AC
  Administered 2016-05-26: 1000 mL via INTRAVENOUS

## 2016-05-26 MED ORDER — DIPHENHYDRAMINE HCL 50 MG/ML IJ SOLN
25.0000 mg | Freq: Once | INTRAMUSCULAR | Status: AC
  Administered 2016-05-26: 25 mg via INTRAVENOUS
  Filled 2016-05-26: qty 1

## 2016-05-26 NOTE — Discharge Instructions (Signed)
CHECK YOUR BLOOD PRESSURE FOR NEXT 3 DAYS - IF IT IS ABOVE 140/90, START THE LISINOPRIL  You are having a headache. No specific cause was found today for your headache. It may have been a migraine or other cause of headache. Stress, anxiety, fatigue, and depression are common triggers for headaches. Your headache today does not appear to be life-threatening or require hospitalization, but often the exact cause of headaches is not determined in the emergency department. Therefore, follow-up with your doctor is very important to find out what may have caused your headache, and whether or not you need any further diagnostic testing or treatment. Sometimes headaches can appear benign (not harmful), but then more serious symptoms can develop which should prompt an immediate re-evaluation by your doctor or the emergency department.  SEEK MEDICAL ATTENTION IF:  You develop possible problems with medications prescribed.  The medications don't resolve your headache, if it recurs , or if you have multiple episodes of vomiting or can't take fluids. You have a change from the usual headache.  RETURN IMMEDIATELY IF you develop a sudden, severe headache or confusion, become poorly responsive or faint, develop a fever above 100.41F or problem breathing, have a change in speech, vision, swallowing, or understanding, or develop new weakness, numbness, tingling, incoordination, or have a seizure.

## 2016-05-26 NOTE — ED Provider Notes (Signed)
[facility] DEPT Provider Note   CSN: [id number] Arrival date & time: 05/25/16  2251  By signing my name below, I, Dolores Hoose, attest that this documentation has been prepared under the direction and in the presence of Ripley Fraise, MD . Electronically Signed: Dolores Hoose, Scribe. 05/26/2016. 1:01 AM.  History   Chief Complaint Chief Complaint  Patient presents with  . Headache   The history is provided by the patient. No language interpreter was used.  Headache   This is a recurrent problem. The current episode started more than 2 days ago. The problem occurs hourly. The problem has been gradually worsening. The headache is associated with nothing. The pain is at a severity of 8/10. The pain is mild. Associated symptoms include nausea. Pertinent negatives include no fever and no vomiting. She has tried nothing for the symptoms. The treatment provided no relief.    HPI Comments:  Erika Larson is a 46 y.o. female who presents to the Emergency Department complaining of intermittent, worsening, 8/10 headaches beginning two weeks ago. She further explains that her headaches were mild until they exacerbated two days ago. No modifying factors indicated. Pt reports associated diaphoresis and nausea, which is worse during her headaches. She denies any fevers, vomiting, visual disturbance, weakness, syncope, cough or abdominal pain. Pt notes that she experienced some sharp, centralized chest pain and SOB earlier, but this has resolved.  She also denies any recent blood loss but notes that her periods are particularly heavy at baseline. Pt states that she has been noncompliant with her normal BP medication and iron pills.    Past Medical History:  Diagnosis Date  . Anemia   . Gall bladder stones 1998  . Hypertension     There are no active problems to display for this patient.   Past Surgical History:  Procedure Laterality Date  . TUBAL LIGATION    . WISDOM TOOTH EXTRACTION       OB History    Gravida Para Term Preterm AB Living   4 3       3    SAB TAB Ectopic Multiple Live Births                   Home Medications    Prior to Admission medications   Medication Sig Start Date End Date Taking? Authorizing Provider  Chlorpheniramine-PSE-Ibuprofen 2-30-200 MG TABS Take 1 tablet by mouth daily.    Historical Provider, MD  cyclobenzaprine (FLEXERIL) 10 MG tablet Take 1 tablet (10 mg total) by mouth 2 (two) times daily as needed for muscle spasms. 02/07/16   [name], NP  ferrous gluconate (FERGON) 324 MG tablet Take 324 mg by mouth 2 (two) times daily with a meal.    Historical Provider, MD  lisinopril-hydrochlorothiazide (PRINZIDE,ZESTORETIC) 10-12.5 MG per tablet Take 1 tablet by mouth daily.    Historical Provider, MD  megestrol (MEGACE) 40 MG tablet Take 3 tabs/day for 3 days, 2 tabs/day for 3 days, then 1 tab/day 08/06/14   [name], CNM  meloxicam (MOBIC) 7.5 MG tablet Take 2 tablets (15 mg total) by mouth daily. 07/08/14   [name], PA-C  oxyCODONE-acetaminophen (PERCOCET/ROXICET) 5-325 MG per tablet Take 1-2 tablets by mouth every 6 (six) hours as needed for severe pain. 07/08/14   [name], PA-C  predniSONE (DELTASONE) 10 MG tablet Take 2 tablets (20 mg total) by mouth 2 (two) times daily with a meal. 02/07/16   Buckley, NP  Family History History reviewed. No pertinent family history.  Social History Social History  Substance Use Topics  . Smoking status: Current Every Day Smoker    Packs/day: 0.25  . Smokeless tobacco: Never Used  . Alcohol use Yes     Allergies   Shellfish allergy and Shellfish-derived products   Review of Systems Review of Systems  Constitutional: Positive for diaphoresis. Negative for fever.  Eyes: Negative for visual disturbance.  Respiratory: Negative for cough.   Gastrointestinal: Positive for nausea. Negative for abdominal pain and vomiting.  Neurological: Positive for headaches. Negative  for syncope and weakness.  All other systems reviewed and are negative.    Physical Exam Updated Vital Signs BP 139/94 (BP Location: Left Arm)   Pulse 99   Temp 99.6 F (37.6 C) (Oral)   Resp 19   LMP 05/18/2016   SpO2 100%   Physical Exam CONSTITUTIONAL: Well developed/well nourished HEAD: Normocephalic/atraumatic EYES: EOMI/PERRL, no nystagmus, no ptosis, ENMT: Mucous membranes moist NECK: supple no meningeal signs, no bruits SPINE/BACK:entire spine nontender CV: S1/S2 noted, no murmurs/rubs/gallops noted LUNGS: Lungs are clear to auscultation bilaterally, no apparent distress ABDOMEN: soft, nontender, no rebound or guarding GU:no cva tenderness RECTAL: minimal stool noted, no blood/melena, no mass, nurse candace present for exam as chaperone NEURO:Awake/alert, face symmetric, no arm or leg drift is noted Equal 5/5 strength with shoulder abduction, elbow flex/extension, wrist flex/extension in upper extremities and equal hand grips bilaterally Equal 5/5 strength with hip flexion,knee flex/extension, foot dorsi/plantar flexion Cranial nerves 3/4/5/6/10/08/08/11/12 tested and intact Gait normal without ataxia No past pointing Sensation to light touch intact in all extremities EXTREMITIES: pulses normal, full ROM SKIN: warm, color normal PSYCH: no abnormalities of mood noted, alert and oriented to situation   ED Treatments / Results  DIAGNOSTIC STUDIES:  Oxygen Saturation is 100% on RA, normal by my interpretation.    COORDINATION OF CARE:  1:08 AM Discussed treatment plan with pt at bedside which includes resuming the pt's blood pressure medications and pt agreed to plan.  Labs (all labs ordered are listed, but only abnormal results are displayed) Labs Reviewed  CBC WITH DIFFERENTIAL/PLATELET - Abnormal; Notable for the following:       Result Value   Hemoglobin 7.5 (*)    HCT 26.6 (*)    MCV 66.2 (*)    MCH 18.7 (*)    MCHC 28.2 (*)    RDW 19.0 (*)    All  other components within normal limits  BASIC METABOLIC PANEL - Abnormal; Notable for the following:    Potassium 3.4 (*)    Glucose, Bld 112 (*)    All other components within normal limits  HCG, QUANTITATIVE, PREGNANCY  POC OCCULT BLOOD, ED    EKG  EKG Interpretation None       Radiology No results found.  Procedures Procedures (including critical care time)  Medications Ordered in ED Medications  sodium chloride 0.9 % bolus 1,000 mL (0 mLs Intravenous Stopped 05/26/16 0255)  metoCLOPramide (REGLAN) injection 10 mg (10 mg Intravenous Given 05/26/16 0149)  diphenhydrAMINE (BENADRYL) injection 25 mg (25 mg Intravenous Given 05/26/16 0149)     Initial Impression / Assessment and Plan / ED Course  I have reviewed the triage vital signs and the nursing notes.  Pertinent labs  results that were available during my care of the patient were reviewed by me and considered in my medical decision making (see chart for details).     Pt in the ED for fatigue/HA  and because her employer told her to go to ER She mentioned CP that occurred prior to visit but none at this time, and her main issue appears to be HA She thought she had the flu.  This does not appear c/w flu like illness Suspect HA due to combination of untreated HTN and also anemia For her anemia, no acute blood loss ongoing at this time.  This is likely an ongoing/chronic process due to heavy menstrual cycle.  Given her overall well appearance, will NOT transfuse PRBC and will start iron tabs.  She will need to have this monitored closely.   Given outpatient resources for followup As for BP it is improved Advised to check BP for next 3 days and if >140/90, she can start lisinopril again   Final Clinical Impressions(s) / ED Diagnoses   Final diagnoses:  Nonintractable headache, unspecified chronicity pattern, unspecified headache type  Anemia, unspecified type    New Prescriptions Discharge Medication List as of  05/26/2016  3:51 AM    START taking these medications   Details  lisinopril (PRINIVIL,ZESTRIL) 5 MG tablet Take 1 tablet (5 mg total) by mouth daily., Starting Sat 05/26/2016, Print      I personally performed the services described in this documentation, which was scribed in my presence. The recorded information has been reviewed and is accurate.        Ripley Fraise, MD 05/26/16 [phone]

## 2016-06-08 ENCOUNTER — Other Ambulatory Visit: Payer: Self-pay | Admitting: Internal Medicine

## 2016-06-08 DIAGNOSIS — Z1231 Encounter for screening mammogram for malignant neoplasm of breast: Secondary | ICD-10-CM

## 2016-06-26 ENCOUNTER — Ambulatory Visit: Payer: No Typology Code available for payment source

## 2016-08-03 ENCOUNTER — Other Ambulatory Visit: Payer: Self-pay | Admitting: Obstetrics and Gynecology

## 2016-08-03 DIAGNOSIS — N920 Excessive and frequent menstruation with regular cycle: Secondary | ICD-10-CM | POA: Insufficient documentation

## 2016-08-03 DIAGNOSIS — F17200 Nicotine dependence, unspecified, uncomplicated: Secondary | ICD-10-CM | POA: Insufficient documentation

## 2016-08-06 LAB — CYTOLOGY - PAP

## 2016-08-07 DIAGNOSIS — E669 Obesity, unspecified: Secondary | ICD-10-CM | POA: Insufficient documentation

## 2016-09-27 ENCOUNTER — Encounter (HOSPITAL_COMMUNITY): Payer: Self-pay | Admitting: Emergency Medicine

## 2016-09-27 ENCOUNTER — Emergency Department (HOSPITAL_COMMUNITY)
Admission: EM | Admit: 2016-09-27 | Discharge: 2016-09-28 | Disposition: A | Discharge status: Home / Self Care | Payer: PRIVATE HEALTH INSURANCE | Attending: Emergency Medicine | Admitting: Emergency Medicine

## 2016-09-27 DIAGNOSIS — Z79899 Other long term (current) drug therapy: Secondary | ICD-10-CM | POA: Diagnosis not present

## 2016-09-27 DIAGNOSIS — I10 Essential (primary) hypertension: Secondary | ICD-10-CM | POA: Insufficient documentation

## 2016-09-27 DIAGNOSIS — R1012 Left upper quadrant pain: Secondary | ICD-10-CM | POA: Insufficient documentation

## 2016-09-27 DIAGNOSIS — R110 Nausea: Secondary | ICD-10-CM

## 2016-09-27 DIAGNOSIS — F17200 Nicotine dependence, unspecified, uncomplicated: Secondary | ICD-10-CM | POA: Insufficient documentation

## 2016-09-27 LAB — COMPREHENSIVE METABOLIC PANEL
ALT: 17 U/L (ref 14–54)
AST: 19 U/L (ref 15–41)
Albumin: 4 g/dL (ref 3.5–5.0)
Alkaline Phosphatase: 69 U/L (ref 38–126)
Anion gap: 9 (ref 5–15)
BILIRUBIN TOTAL: 0.5 mg/dL (ref 0.3–1.2)
BUN: 5 mg/dL — AB (ref 6–20)
CALCIUM: 8.9 mg/dL (ref 8.9–10.3)
CO2: 25 mmol/L (ref 22–32)
Chloride: 104 mmol/L (ref 101–111)
Creatinine, Ser: 0.93 mg/dL (ref 0.44–1.00)
GFR calc Af Amer: >60 mL/min (ref >60)
Glucose, Bld: 100 mg/dL — ABNORMAL HIGH (ref 65–99)
Potassium: 3.9 mmol/L (ref 3.5–5.1)
Sodium: 138 mmol/L (ref 135–145)
Total Protein: 7.4 g/dL (ref 6.5–8.1)

## 2016-09-27 LAB — CBC
HCT: 38.6 % (ref 36.0–46.0)
Hemoglobin: 12.1 g/dL (ref 12.0–15.0)
MCH: 27.9 pg (ref 26.0–34.0)
MCHC: 31.3 g/dL (ref 30.0–36.0)
MCV: 88.9 fL (ref 78.0–100.0)
Platelets: 279 10*3/uL (ref 150–400)
RBC: 4.34 MIL/uL (ref 3.87–5.11)
RDW: 14 % (ref 11.5–15.5)
WBC: 10 10*3/uL (ref 4.0–10.5)

## 2016-09-27 LAB — I-STAT BETA HCG BLOOD, ED (MC, WL, AP ONLY): I-stat hCG, quantitative: <5 m[IU]/mL (ref <5)

## 2016-09-27 LAB — URINALYSIS, ROUTINE W REFLEX MICROSCOPIC
Bilirubin Urine: NEGATIVE
GLUCOSE, UA: NEGATIVE mg/dL
KETONES UR: NEGATIVE mg/dL
LEUKOCYTES UA: NEGATIVE
NITRITE: NEGATIVE
PROTEIN: NEGATIVE mg/dL
Specific Gravity, Urine: <1.005 — ABNORMAL LOW (ref 1.005–1.030)
pH: 6.5 (ref 5.0–8.0)

## 2016-09-27 LAB — LIPASE, BLOOD: Lipase: 23 U/L (ref 11–51)

## 2016-09-27 LAB — URINALYSIS, MICROSCOPIC (REFLEX)

## 2016-09-27 NOTE — ED Triage Notes (Signed)
Pt presents to ED for assessment of left sided abdominal pain starting tonight when she was gargling, got gagged, and began to vomit.  Pt denies diarrhea, c/o 3 episodes of vomiting.

## 2016-09-28 DIAGNOSIS — R1012 Left upper quadrant pain: Secondary | ICD-10-CM | POA: Diagnosis not present

## 2016-09-28 MED ORDER — GI COCKTAIL ~~LOC~~
30.0000 mL | Freq: Once | ORAL | Status: AC
  Administered 2016-09-28: 30 mL via ORAL
  Filled 2016-09-28: qty 30

## 2016-09-28 MED ORDER — SODIUM CHLORIDE 0.9 % IV BOLUS (SEPSIS)
1000.0000 mL | Freq: Once | INTRAVENOUS | Status: AC
  Administered 2016-09-28: 1000 mL via INTRAVENOUS

## 2016-09-28 MED ORDER — MORPHINE SULFATE (PF) 4 MG/ML IV SOLN
4.0000 mg | Freq: Once | INTRAVENOUS | Status: AC
  Administered 2016-09-28: 4 mg via INTRAVENOUS
  Filled 2016-09-28: qty 1

## 2016-09-28 MED ORDER — ONDANSETRON HCL 4 MG PO TABS: 4.0000 mg | ORAL_TABLET | Freq: Four times a day (QID) | ORAL | 0 refills | Status: DC

## 2016-09-28 MED ORDER — PANTOPRAZOLE SODIUM 40 MG IV SOLR
40.0000 mg | Freq: Once | INTRAVENOUS | Status: AC
  Administered 2016-09-28: 40 mg via INTRAVENOUS
  Filled 2016-09-28: qty 40

## 2016-09-28 MED ORDER — SUCRALFATE 1 GM/10ML PO SUSP
1.0000 g | Freq: Three times a day (TID) | ORAL | 0 refills | Status: DC
Start: 2016-09-28 — End: 2018-01-31

## 2016-09-28 MED ORDER — ONDANSETRON HCL 4 MG/2ML IJ SOLN
4.0000 mg | Freq: Once | INTRAMUSCULAR | Status: AC
  Administered 2016-09-28: 4 mg via INTRAVENOUS
  Filled 2016-09-28: qty 2

## 2016-09-28 NOTE — ED Provider Notes (Signed)
[facility] DEPT Provider Note   CSN: [id number] Arrival date & time: 09/27/16  1938     History   Chief Complaint Chief Complaint  Patient presents with  . Abdominal Pain    HPI Erika Larson is a 46 y.o. female.  Patient presents emergency department with chief complaint of nausea and vomiting. He reports associated left upper abdominal pain. She states symptoms started today. She denies any fevers chills. Denies any diarrhea or constipation. Denies any dysuria or hematuria. Denies any vaginal discharge. She states that she just finished her period 2 weeks ago. She has not taken anything for her symptoms. She denies any prior abdominal surgeries. There are no other associated symptoms or modifying factors.   The history is provided by the patient. No language interpreter was used.    Past Medical History:  Diagnosis Date  . Anemia   . Gall bladder stones 1998  . Hypertension     There are no active problems to display for this patient.   Past Surgical History:  Procedure Laterality Date  . TUBAL LIGATION    . WISDOM TOOTH EXTRACTION      OB History    Gravida Para Term Preterm AB Living   4 3       3    SAB TAB Ectopic Multiple Live Births                   Home Medications    Prior to Admission medications   Medication Sig Start Date End Date Taking? Authorizing Provider  ferrous gluconate (FERGON) 324 MG tablet Take 1 tablet (324 mg total) by mouth 2 (two) times daily with a meal. 05/26/16   Ripley Fraise, MD  lisinopril (PRINIVIL,ZESTRIL) 5 MG tablet Take 1 tablet (5 mg total) by mouth daily. 05/26/16   Ripley Fraise, MD    Family History History reviewed. No pertinent family history.  Social History Social History  Substance Use Topics  . Smoking status: Current Every Day Smoker    Packs/day: 0.25  . Smokeless tobacco: Never Used  . Alcohol use Yes     Allergies   Shellfish allergy and Shellfish-derived products   Review of  Systems Review of Systems  All other systems reviewed and are negative.    Physical Exam Updated Vital Signs BP (!) 154/97 (BP Location: Right Arm)   Pulse 74   Temp 97.9 F (36.6 C) (Oral)   Resp 16   Ht 5\' 4"  (1.626 m)   Wt 94.8 kg (209 lb)   SpO2 100%   BMI 35.87 kg/m   Physical Exam  Constitutional: She is oriented to person, place, and time. She appears well-developed and well-nourished.  HENT:  Head: Normocephalic and atraumatic.  Eyes: Conjunctivae and EOM are normal. Pupils are equal, round, and reactive to light.  Neck: Normal range of motion. Neck supple.  Cardiovascular: Normal rate and regular rhythm.  Exam reveals no gallop and no friction rub.   No murmur heard. Pulmonary/Chest: Effort normal and breath sounds normal. No respiratory distress. She has no wheezes. She has no rales. She exhibits no tenderness.  Abdominal: Soft. Bowel sounds are normal. She exhibits no distension and no mass. There is tenderness. There is no rebound and no guarding.  Moderate left upper abdominal tenderness, no right upper abdominal tenderness, no other focal abdominal tenderness  Musculoskeletal: Normal range of motion. She exhibits no edema or tenderness.  Neurological: She is alert and oriented to person, place, and time.  Skin:  Skin is warm and dry.  Psychiatric: She has a normal mood and affect. Her behavior is normal. Judgment and thought content normal.  Nursing note and vitals reviewed.    ED Treatments / Results  Labs (all labs ordered are listed, but only abnormal results are displayed) Labs Reviewed  COMPREHENSIVE METABOLIC PANEL - Abnormal; Notable for the following:       Result Value   Glucose, Bld 100 (*)    BUN 5 (*)    All other components within normal limits  URINALYSIS, ROUTINE W REFLEX MICROSCOPIC - Abnormal; Notable for the following:    Specific Gravity, Urine <1.005 (*)    Hgb urine dipstick SMALL (*)    All other components within normal limits    URINALYSIS, MICROSCOPIC (REFLEX) - Abnormal; Notable for the following:    Bacteria, UA RARE (*)    Squamous Epithelial / LPF 0-5 (*)    All other components within normal limits  LIPASE, BLOOD  CBC  I-STAT BETA HCG BLOOD, ED ([facility], [facility], [facility] ONLY)    EKG  EKG Interpretation None       Radiology No results found.  Procedures Procedures (including critical care time)  Medications Ordered in ED Medications  morphine 4 MG/ML injection 4 mg (not administered)  ondansetron (ZOFRAN) injection 4 mg (not administered)  pantoprazole (PROTONIX) injection 40 mg (not administered)  gi cocktail (Maalox,Lidocaine,Donnatal) (not administered)  sodium chloride 0.9 % bolus 1,000 mL (not administered)     Initial Impression / Assessment and Plan / ED Course  I have reviewed the triage vital signs and the nursing notes.  Pertinent labs & imaging results that were available during my care of the patient were reviewed by me and considered in my medical decision making (see chart for details).     Patient with nausea and vomiting. Also has left upper abdominal pain. Is generally well-appearing. Vital signs are stable. Laboratory work is reassuring. She has normal duties and lipase. She has no right upper abdominal pain. I doubt cholecystitis. She has no lower abdominal pain, doubt appendicitis or diverticulitis. She has no dysuria or hematuria or vaginal discharge. Will treat symptoms, fluid challenge, and anticipate discharge to home.  1:56 AM Feels improved.  Doubt surgical abdomen.  Possible gastritis vs PUD.  Will treat with zofran and carafate.  Recommend PCP follow-up.  Patient instructed to return for:  New or worsening symptoms, including, increased abdominal pain, especially pain that localizes to one side, bloody vomit, bloody diarrhea, fever >101, and intractable vomiting.   Final Clinical Impressions(s) / ED Diagnoses   Final diagnoses:  Left upper quadrant pain  Nausea     New Prescriptions New Prescriptions   No medications on file     Delaine Lame 01/30/58 4585    Delora Fuel, MD 92/92/44 [phone]

## 2016-09-28 NOTE — ED Notes (Signed)
Pt able to drink cup of ice water with no difficulty. Denies N/V.

## 2018-01-31 ENCOUNTER — Ambulatory Visit (HOSPITAL_COMMUNITY)
Admission: EM | Admit: 2018-01-31 | Discharge: 2018-01-31 | Disposition: A | Discharge status: Home / Self Care | Payer: 59 | Attending: Family Medicine | Admitting: Family Medicine

## 2018-01-31 ENCOUNTER — Encounter (HOSPITAL_COMMUNITY): Payer: Self-pay | Admitting: Emergency Medicine

## 2018-01-31 DIAGNOSIS — H1032 Unspecified acute conjunctivitis, left eye: Secondary | ICD-10-CM

## 2018-01-31 MED ORDER — LISINOPRIL 5 MG PO TABS: 5.0000 mg | ORAL_TABLET | Freq: Every day | ORAL | 0 refills | Status: DC

## 2018-01-31 MED ORDER — POLYMYXIN B-TRIMETHOPRIM 10000-0.1 UNIT/ML-% OP SOLN: 1.0000 [drp] | OPHTHALMIC | 0 refills | Status: DC

## 2018-01-31 NOTE — ED Triage Notes (Signed)
Pt c/o L eye drainage and redness.

## 2018-01-31 NOTE — Discharge Instructions (Signed)
Bacterial Conjunctivitis  - Have Good Hand Hygiene  - Use Cold Compresses  - Artificial Tears 5-6 times a day  - Use Polytrim eye drops (2 drops 4 times a day for 5-7 days)

## 2018-02-01 NOTE — ED Provider Notes (Signed)
[facility]    CSN: [id number] Arrival date & time: 01/31/18  3785     History   Chief Complaint Chief Complaint  Patient presents with  . Eye Problem    HPI Erika Larson is a 47 y.o. female history of hypertension presenting today for evaluation of left eye drainage and redness.  Patient states that for the past 2 days she has had significant amount of thick drainage from her left eye.  She is also had increased redness.  She has had associating itching, foreign body sensation as well as burning sensation.  Has light sensitivity with this.  She tried Visine without relief.  Patient wears glasses, but denies contact use.  Denies associated URI symptoms.  Denies fevers.  Denies changes in vision.  HPI  Past Medical History:  Diagnosis Date  . Anemia   . Gall bladder stones 1998  . Hypertension     There are no active problems to display for this patient.   Past Surgical History:  Procedure Laterality Date  . TUBAL LIGATION    . WISDOM TOOTH EXTRACTION      OB History    Gravida  4   Para  3   Term      Preterm      AB      Living  3     SAB      TAB      Ectopic      Multiple      Live Births               Home Medications    Prior to Admission medications   Medication Sig Start Date End Date Taking? Authorizing Provider  lisinopril (PRINIVIL,ZESTRIL) 5 MG tablet Take 1 tablet (5 mg total) by mouth daily. 01/31/18   Wieters, Hallie C, PA-C  trimethoprim-polymyxin b (POLYTRIM) ophthalmic solution Place 1 drop into the left eye every 4 (four) hours. 01/31/18   Wieters, Elesa Hacker, PA-C    Family History No family history on file.  Social History Social History   Tobacco Use  . Smoking status: Current Every Day Smoker    Packs/day: 0.25  . Smokeless tobacco: Never Used  Substance Use Topics  . Alcohol use: Yes  . Drug use: No     Allergies   Shellfish allergy and Shellfish-derived products   Review of  Systems Review of Systems  Constitutional: Negative for activity change, appetite change, chills, fatigue and fever.  HENT: Negative for congestion, ear pain, rhinorrhea, sinus pressure, sore throat and trouble swallowing.   Eyes: Positive for photophobia, pain, discharge, redness and itching.  Respiratory: Negative for cough, chest tightness and shortness of breath.   Cardiovascular: Negative for chest pain.  Gastrointestinal: Negative for abdominal pain, diarrhea, nausea and vomiting.  Musculoskeletal: Negative for myalgias.  Skin: Negative for rash.  Neurological: Negative for dizziness, light-headedness and headaches.     Physical Exam Triage Vital Signs ED Triage Vitals [01/31/18 2005]  Enc Vitals Group     BP (!) 163/97     Pulse Rate 89     Resp 18     Temp 98.6 F (37 C)     Temp src      SpO2 100 %     Weight      Height      Head Circumference      Peak Flow      Pain Score 8     Pain Loc  Pain Edu?      Excl. in [place]?    No data found.  Updated Vital Signs BP (!) 163/97   Pulse 89   Temp 98.6 F (37 C)   Resp 18   SpO2 100%   Visual Acuity Right Eye Distance:   Left Eye Distance:   Bilateral Distance:    Right Eye Near: R Near: 20/20 Left Eye Near:  L Near: 20/100 Bilateral Near:     Physical Exam  Constitutional: She is oriented to person, place, and time. She appears well-developed and well-nourished.  No acute distress  HENT:  Head: Normocephalic and atraumatic.  Nose: Nose normal.  Bilateral ears without tenderness to palpation of external auricle, tragus and mastoid, EAC's without erythema or swelling, TM's with good bony landmarks and cone of light. Non erythematous.  Oral mucosa pink and moist, no tonsillar enlargement or exudate. Posterior pharynx patent and nonerythematous, no uvula deviation or swelling. Normal phonation.  Eyes: Pupils are equal, round, and reactive to light. Conjunctivae and EOM are normal.  No consensual  photophobia; conjunctive a erythematous throughout left eye, not concentrated around the limbus, surrounding periorbital area slightly erythematous, but minimal swelling, nontender to touch; red reflex present  No abrasion, ulcer or uptake on fluorescein staining Discomfort relieved with tetracaine  Neck: Neck supple.  Cardiovascular: Normal rate.  Pulmonary/Chest: Effort normal. No respiratory distress.  Abdominal: She exhibits no distension.  Musculoskeletal: Normal range of motion.  Neurological: She is alert and oriented to person, place, and time.  Skin: Skin is warm and dry.  Psychiatric: She has a normal mood and affect.  Nursing note and vitals reviewed.    UC Treatments / Results  Labs (all labs ordered are listed, but only abnormal results are displayed) Labs Reviewed - No data to display  EKG None  Radiology No results found.  Procedures Procedures (including critical care time)  Medications Ordered in UC Medications - No data to display  Initial Impression / Assessment and Plan / UC Course  I have reviewed the triage vital signs and the nursing notes.  Pertinent labs & imaging results that were available during my care of the patient were reviewed by me and considered in my medical decision making (see chart for details).     Patient appears to have bacterial conjunctivitis.  Patient does have more photophobia than what would be expected with conjunctivitis, but did not have consensual photophobia or concentration near the limbus concerning for iritis.  At this time will provide patient with Polytrim eyedrops to use, advised to follow-up with ophthalmology given the photophobia and decreased visual acuity, for further evaluation of symptoms.  Ophthalmologist on-call provided as well as to her ophthalmologist offices if not been there network for insurance.  Advised to return if developing worsening swelling or redness around eye, changes in vision or increased  pain.  Refill of lisinopril provided, discussed getting set up with a PCP for further medication refills.  Discussed strict return precautions. Patient verbalized understanding and is agreeable with plan.  Final Clinical Impressions(s) / UC Diagnoses   Final diagnoses:  Acute bacterial conjunctivitis of left eye     Discharge Instructions     Bacterial Conjunctivitis  - Have Good Hand Hygiene  - Use Cold Compresses  - Artificial Tears 5-6 times a day  - Use Polytrim eye drops (2 drops 4 times a day for 5-7 days)     ED Prescriptions    Medication Sig Dispense Auth. Provider  trimethoprim-polymyxin b (POLYTRIM) ophthalmic solution Place 1 drop into the left eye every 4 (four) hours. 10 mL Wieters, Hallie C, PA-C   lisinopril (PRINIVIL,ZESTRIL) 5 MG tablet Take 1 tablet (5 mg total) by mouth daily. 30 tablet Wieters, Silas C, PA-C     Controlled Substance Prescriptions [place] Controlled Substance Registry consulted? Not Applicable   Janith Lima, [place] 02/01/18 3220

## 2018-03-27 DIAGNOSIS — Z01419 Encounter for gynecological examination (general) (routine) without abnormal findings: Secondary | ICD-10-CM | POA: Diagnosis not present

## 2018-04-14 DIAGNOSIS — R102 Pelvic and perineal pain: Secondary | ICD-10-CM | POA: Diagnosis not present

## 2018-04-14 DIAGNOSIS — N920 Excessive and frequent menstruation with regular cycle: Secondary | ICD-10-CM | POA: Diagnosis not present

## 2018-04-24 ENCOUNTER — Ambulatory Visit: Payer: 59 | Admitting: Family Medicine

## 2018-04-24 ENCOUNTER — Encounter: Payer: Self-pay | Admitting: Family Medicine

## 2018-04-24 VITALS — BP 112/76 | HR 80 | Temp 98.4°F | Ht 63.0 in | Wt 207.0 lb

## 2018-04-24 DIAGNOSIS — R059 Cough, unspecified: Secondary | ICD-10-CM

## 2018-04-24 DIAGNOSIS — I10 Essential (primary) hypertension: Secondary | ICD-10-CM | POA: Diagnosis not present

## 2018-04-24 DIAGNOSIS — R05 Cough: Secondary | ICD-10-CM

## 2018-04-24 DIAGNOSIS — M1990 Unspecified osteoarthritis, unspecified site: Secondary | ICD-10-CM | POA: Diagnosis not present

## 2018-04-24 DIAGNOSIS — F17210 Nicotine dependence, cigarettes, uncomplicated: Secondary | ICD-10-CM

## 2018-04-24 DIAGNOSIS — Z7689 Persons encountering health services in other specified circumstances: Secondary | ICD-10-CM

## 2018-04-24 MED ORDER — LISINOPRIL 5 MG PO TABS: 5.0000 mg | ORAL_TABLET | Freq: Every day | ORAL | 4 refills | Status: DC

## 2018-04-24 NOTE — Patient Instructions (Signed)
Managing Your Hypertension Hypertension is commonly called high blood pressure. This is when the force of your blood pressing against the walls of your arteries is too strong. Arteries are blood vessels that carry blood from your heart throughout your body. Hypertension forces the heart to work harder to pump blood, and may cause the arteries to become narrow or stiff. Having untreated or uncontrolled hypertension can cause heart attack, stroke, kidney disease, and other problems. What are blood pressure readings? A blood pressure reading consists of a higher number over a lower number. Ideally, your blood pressure should be below 120/80. The first ("top") number is called the systolic pressure. It is a measure of the pressure in your arteries as your heart beats. The second ("bottom") number is called the diastolic pressure. It is a measure of the pressure in your arteries as the heart relaxes. What does my blood pressure reading mean? Blood pressure is classified into four stages. Based on your blood pressure reading, your health care provider may use the following stages to determine what type of treatment you need, if any. Systolic pressure and diastolic pressure are measured in a unit called mm Hg. Normal  Systolic pressure: below 120.  Diastolic pressure: below 80. Elevated  Systolic pressure: 120-129.  Diastolic pressure: below 80. Hypertension stage 1  Systolic pressure: 130-139.  Diastolic pressure: 80-89. Hypertension stage 2  Systolic pressure: 140 or above.  Diastolic pressure: 90 or above. What health risks are associated with hypertension? Managing your hypertension is an important responsibility. Uncontrolled hypertension can lead to:  A heart attack.  A stroke.  A weakened blood vessel (aneurysm).  Heart failure.  Kidney damage.  Eye damage.  Metabolic syndrome.  Memory and concentration problems. What changes can I make to manage my  hypertension? Hypertension can be managed by making lifestyle changes and possibly by taking medicines. Your health care provider will help you make a plan to bring your blood pressure within a normal range. Eating and drinking   Eat a diet that is high in fiber and potassium, and low in salt (sodium), added sugar, and fat. An example eating plan is called the DASH (Dietary Approaches to Stop Hypertension) diet. To eat this way: ? Eat plenty of fresh fruits and vegetables. Try to fill half of your plate at each meal with fruits and vegetables. ? Eat whole grains, such as whole wheat pasta, brown rice, or whole grain bread. Fill about one quarter of your plate with whole grains. ? Eat low-fat diary products. ? Avoid fatty cuts of meat, processed or cured meats, and poultry with skin. Fill about one quarter of your plate with lean proteins such as fish, chicken without skin, beans, eggs, and tofu. ? Avoid premade and processed foods. These tend to be higher in sodium, added sugar, and fat.  Reduce your daily sodium intake. Most people with hypertension should eat less than 1,500 mg of sodium a day.  Limit alcohol intake to no more than 1 drink a day for nonpregnant women and 2 drinks a day for men. One drink equals 12 oz of beer, 5 oz of wine, or 1 oz of hard liquor. Lifestyle  Work with your health care provider to maintain a healthy body weight, or to lose weight. Ask what an ideal weight is for you.  Get at least 30 minutes of exercise that causes your heart to beat faster (aerobic exercise) most days of the week. Activities may include walking, swimming, or biking.  Include exercise   to strengthen your muscles (resistance exercise), such as weight lifting, as part of your weekly exercise routine. Try to do these types of exercises for 30 minutes at least 3 days a week.  Do not use any products that contain nicotine or tobacco, such as cigarettes and e-cigarettes. If you need help quitting,  ask your health care provider.  Control any long-term (chronic) conditions you have, such as high cholesterol or diabetes. Monitoring  Monitor your blood pressure at home as told by your health care provider. Your personal target blood pressure may vary depending on your medical conditions, your age, and other factors.  Have your blood pressure checked regularly, as often as told by your health care provider. Working with your health care provider  Review all the medicines you take with your health care provider because there may be side effects or interactions.  Talk with your health care provider about your diet, exercise habits, and other lifestyle factors that may be contributing to hypertension.  Visit your health care provider regularly. Your health care provider can help you create and adjust your plan for managing hypertension. Will I need medicine to control my blood pressure? Your health care provider may prescribe medicine if lifestyle changes are not enough to get your blood pressure under control, and if:  Your systolic blood pressure is 130 or higher.  Your diastolic blood pressure is 80 or higher. Take medicines only as told by your health care provider. Follow the directions carefully. Blood pressure medicines must be taken as prescribed. The medicine does not work as well when you skip doses. Skipping doses also puts you at risk for problems. Contact a health care provider if:  You think you are having a reaction to medicines you have taken.  You have repeated (recurrent) headaches.  You feel dizzy.  You have swelling in your ankles.  You have trouble with your vision. Get help right away if:  You develop a severe headache or confusion.  You have unusual weakness or numbness, or you feel faint.  You have severe pain in your chest or abdomen.  You vomit repeatedly.  You have trouble breathing. Summary  Hypertension is when the force of blood pumping  through your arteries is too strong. If this condition is not controlled, it may put you at risk for serious complications.  Your personal target blood pressure may vary depending on your medical conditions, your age, and other factors. For most people, a normal blood pressure is less than 120/80.  Hypertension is managed by lifestyle changes, medicines, or both. Lifestyle changes include weight loss, eating a healthy, low-sodium diet, exercising more, and limiting alcohol. This information is not intended to replace advice given to you by your health care provider. Make sure you discuss any questions you have with your health care provider. Document Released: 12/12/2011 Document Revised: 02/15/2016 Document Reviewed: 02/15/2016 Elsevier Interactive Patient Education  2019 [employer].  How to Take Your Blood Pressure You can take your blood pressure at home with a machine. You may need to check your blood pressure at home:  To check if you have high blood pressure (hypertension).  To check your blood pressure over time.  To make sure your blood pressure medicine is working. Supplies needed: You will need a blood pressure machine, or monitor. You can buy one at a drugstore or online. When choosing one:  Choose one with an arm cuff.  Choose one that wraps around your upper arm. Only one finger should  fit between your arm and the cuff.  Do not choose one that measures your blood pressure from your wrist or finger. Your doctor can suggest a monitor. How to prepare Avoid these things for 30 minutes before checking your blood pressure:  Drinking caffeine.  Drinking alcohol.  Eating.  Smoking.  Exercising. Five minutes before checking your blood pressure:  Pee.  Sit in a dining chair. Avoid sitting in a soft couch or armchair.  Be quiet. Do not talk. How to take your blood pressure Follow the instructions that came with your machine. If you have a digital blood pressure  monitor, these may be the instructions: 1. Sit up straight. 2. Place your feet on the floor. Do not cross your ankles or legs. 3. Rest your left arm at the level of your heart. You may rest it on a table, desk, or chair. 4. Pull up your shirt sleeve. 5. Wrap the blood pressure cuff around the upper part of your left arm. The cuff should be 1 inch (2.5 cm) above your elbow. It is best to wrap the cuff around bare skin. 6. Fit the cuff snugly around your arm. You should be able to place only one finger between the cuff and your arm. 7. Put the cord inside the groove of your elbow. 8. Press the power button. 9. Sit quietly while the cuff fills with air and loses air. 10. Write down the numbers on the screen. 11. Wait 2-3 minutes and then repeat steps 1-10. What do the numbers mean? Two numbers make up your blood pressure. The first number is called systolic pressure. The second is called diastolic pressure. An example of a blood pressure reading is "120 over 80" (or 120/80). If you are an adult and do not have a medical condition, use this guide to find out if your blood pressure is normal: Normal  First number: below 120.  Second number: below 80. Elevated  First number: 120-129.  Second number: below 80. Hypertension stage 1  First number: 130-139.  Second number: 80-89. Hypertension stage 2  First number: 140 or above.  Second number: 13 or above. Your blood pressure is above normal even if only the top or bottom number is above normal. Follow these instructions at home:  Check your blood pressure as often as your doctor tells you to.  Take your monitor to your next doctor's appointment. Your doctor will: ? Make sure you are using it correctly. ? Make sure it is working right.  Make sure you understand what your blood pressure numbers should be.  Tell your doctor if your medicines are causing side effects. Contact a doctor if:  Your blood pressure keeps being  high. Get help right away if:  Your first blood pressure number is higher than 180.  Your second blood pressure number is higher than 120. This information is not intended to replace advice given to you by your health care provider. Make sure you discuss any questions you have with your health care provider. Document Released: 03/01/2008 Document Revised: 02/15/2016 Document Reviewed: 08/26/2015 Elsevier Interactive Patient Education  2019 [employer].  Steps to Quit Smoking  Smoking tobacco can be bad for your health. It can also affect almost every organ in your body. Smoking puts you and people around you at risk for many serious long-lasting (chronic) diseases. Quitting smoking is hard, but it is one of the best things that you can do for your health. It is never too late to quit.  What are the benefits of quitting smoking? When you quit smoking, you lower your risk for getting serious diseases and conditions. They can include:  Lung cancer or lung disease.  Heart disease.  Stroke.  Heart attack.  Not being able to have children (infertility).  Weak bones (osteoporosis) and broken bones (fractures). If you have coughing, wheezing, and shortness of breath, those symptoms may get better when you quit. You may also get sick less often. If you are pregnant, quitting smoking can help to lower your chances of having a baby of low birth weight. What can I do to help me quit smoking? Talk with your doctor about what can help you quit smoking. Some things you can do (strategies) include:  Quitting smoking totally, instead of slowly cutting back how much you smoke over a period of time.  Going to in-person counseling. You are more likely to quit if you go to many counseling sessions.  Using resources and support systems, such as: ? Database administrator with a Social worker. ? Phone quitlines. ? Careers information officer. ? Support groups or group counseling. ? Text messaging  programs. ? Mobile phone apps or applications.  Taking medicines. Some of these medicines may have nicotine in them. If you are pregnant or breastfeeding, do not take any medicines to quit smoking unless your doctor says it is okay. Talk with your doctor about counseling or other things that can help you. Talk with your doctor about using more than one strategy at the same time, such as taking medicines while you are also going to in-person counseling. This can help make quitting easier. What things can I do to make it easier to quit? Quitting smoking might feel very hard at first, but there is a lot that you can do to make it easier. Take these steps:  Talk to your family and friends. Ask them to support and encourage you.  Call phone quitlines, reach out to support groups, or work with a Social worker.  Ask people who smoke to not smoke around you.  Avoid places that make you want (trigger) to smoke, such as: ? Bars. ? Parties. ? Smoke-break areas at work.  Spend time with people who do not smoke.  Lower the stress in your life. Stress can make you want to smoke. Try these things to help your stress: ? Getting regular exercise. ? Deep-breathing exercises. ? Yoga. ? Meditating. ? Doing a body scan. To do this, close your eyes, focus on one area of your body at a time from head to toe, and notice which parts of your body are tense. Try to relax the muscles in those areas.  Download or buy apps on your mobile phone or tablet that can help you stick to your quit plan. There are many free apps, such as QuitGuide from the [employer] Office manager for Disease Control and Prevention). You can find more support from smokefree.gov and other websites. This information is not intended to replace advice given to you by your health care provider. Make sure you discuss any questions you have with your health care provider. Document Released: 01/13/2009 Document Revised: 11/15/2015 Document Reviewed:  08/03/2014 Elsevier Interactive Patient Education  2019 [employer].

## 2018-04-24 NOTE — Progress Notes (Signed)
Patient presents to clinic today for ongoing concerns and to establish care.  SUBJECTIVE: PMH: Pt is a 48 yo female with pmh sig for HTN, arthritis.  Pt did not have a pcp.  She was seen at [facility].  HTN: -taking lisinopril 5 mg daily -needs refill -not checking bp at home -endorses fam hx of HTN  Arthritis: -notes pain in L leg -may use Bengay -also notes ocasional leg cramps  Nicotine use: -smoking 1/2ppd  -considering quitting  Cough: -x "a while" -has not tried anything -feels like she needs to clear her throat, occasional cough  Allergies: Shellfish-edema  Social Hx: Pt is married. Pt has 3 kids. Pt currently works as a Tree surgeon.  Pt denies EtOH use or drug use.  Endorses tobacco use.  Health Maintenance: [facility] --[facility].  Last Eye Exam 01/2018 Mammogram -- 04/14/18 PAP -- Dec 2019  Past Medical History:  Diagnosis Date  . Anemia   . Arthritis   . Gall bladder stones 1998  . Hypertension     Past Surgical History:  Procedure Laterality Date  . TUBAL LIGATION    . WISDOM TOOTH EXTRACTION      Current Outpatient Medications on File Prior to Visit  Medication Sig Dispense Refill  . IRON, FERROUS SULFATE, PO Take by mouth daily.    Marland Kitchen lisinopril (PRINIVIL,ZESTRIL) 5 MG tablet Take 1 tablet (5 mg total) by mouth daily. 30 tablet 0  . omeprazole (PRILOSEC) 40 MG capsule Take 40 mg by mouth daily.     No current facility-administered medications on file prior to visit.     Allergies  Allergen Reactions  . Shellfish Allergy Anaphylaxis and Swelling  . Shellfish-Derived Products Anaphylaxis and Swelling    Family History  Problem Relation Age of Onset  . Arthritis Mother   . Cancer Mother   . Hearing loss Mother   . Hypertension Mother   . Cancer Sister   . Cancer Brother     Social History   Socioeconomic History  . Marital status: Married    Spouse name: Not on file  . Number of children: Not  on file  . Years of education: Not on file  . Highest education level: Not on file  Occupational History  . Not on file  Social Needs  . Financial resource strain: Not on file  . Food insecurity:    Worry: Not on file    Inability: Not on file  . Transportation needs:    Medical: Not on file    Non-medical: Not on file  Tobacco Use  . Smoking status: Current Every Day Smoker    Packs/day: 0.25  . Smokeless tobacco: Never Used  Substance and Sexual Activity  . Alcohol use: Yes  . Drug use: No  . Sexual activity: Yes    Birth control/protection: None, I.U.D.  Lifestyle  . Physical activity:    Days per week: Not on file    Minutes per session: Not on file  . Stress: Not on file  Relationships  . Social connections:    Talks on phone: Not on file    Gets together: Not on file    Attends religious service: Not on file    Active member of club or organization: Not on file    Attends meetings of clubs or organizations: Not on file    Relationship status: Not on file  . Intimate partner violence:    Fear of current or  ex partner: Not on file    Emotionally abused: Not on file    Physically abused: Not on file    Forced sexual activity: Not on file  Other Topics Concern  . Not on file  Social History Narrative  . Not on file   ROS General: Denies fever, chills, night sweats, changes in weight, changes in appetite HEENT: Denies headaches, ear pain, changes in vision, rhinorrhea, sore throat CV: Denies CP, palpitations, SOB, orthopnea Pulm: Denies SOB, wheezing  +cough GI: Denies abdominal pain, nausea, vomiting, diarrhea, constipation GU: Denies dysuria, hematuria, frequency, vaginal discharge Msk: Denies muscle cramps  + joint pains Neuro: Denies weakness, numbness, tingling Skin: Denies rashes, bruising Psych: Denies depression, anxiety, hallucinations   BP 112/76 (BP Location: Left Arm, Patient Position: Sitting, Cuff Size: Large)   Pulse 80   Temp 98.4 F (36.9  C) (Oral)   Ht 5\' 3"  (1.6 m)   Wt 207 lb (93.9 kg)   LMP 04/09/2018 (Exact Date)   SpO2 99%   BMI 36.67 kg/m   Physical Exam Gen. Pleasant, well developed, well-nourished, in NAD HEENT - Stella/AT, PERRL, no scleral icterus, no nasal drainage, pharynx without erythema or exudate. Lungs: intermittent coughing, no use of accessory muscles, CTAB, no wheezes, rales or rhonchi Cardiovascular: RRR, No r/g/m, no peripheral edema Neuro:  A&Ox3, CN II-XII intact, normal gait Skin:  Warm, dry, intact, no lesions  No results found for this or any previous visit (from the past 2160 hour(s)).  Assessment/Plan: Essential hypertension -controlled -continue lisinopril 5 mg daily -refill sent in -continue lifestyle changes -encouraged to quit smoking  Arthritis -continue Bengay  Cigarette nicotine dependence without complication -smoking cessation >3 min, less than 10 min -smoking 1/2 ppd -discussed various ways to help quit -will try cutting down -will monitor at each [facility].  Cough -discussed various causes including allergies vs medication (lisinopril) -pt to try OTC allergy meds or local honey for symptoms -if cough continues discussed changing lisinopril 5 mg  Encounter to establish care -We reviewed the PMH, PSH, FH, SH, Meds and Allergies. -We provided refills for any medications we will prescribe as needed. -We addressed current concerns per orders and patient instructions. -We have asked for records for pertinent exams, studies, vaccines and notes from previous providers. -We have advised patient to follow up per instructions below.   F/u in 1 month  Grier Mitts, MD

## 2018-04-27 ENCOUNTER — Encounter: Payer: Self-pay | Admitting: Family Medicine

## 2018-04-27 DIAGNOSIS — I10 Essential (primary) hypertension: Secondary | ICD-10-CM | POA: Insufficient documentation

## 2018-04-27 DIAGNOSIS — F17210 Nicotine dependence, cigarettes, uncomplicated: Secondary | ICD-10-CM | POA: Insufficient documentation

## 2018-04-27 DIAGNOSIS — M1990 Unspecified osteoarthritis, unspecified site: Secondary | ICD-10-CM | POA: Insufficient documentation

## 2018-05-16 DIAGNOSIS — Z30431 Encounter for routine checking of intrauterine contraceptive device: Secondary | ICD-10-CM | POA: Diagnosis not present

## 2018-05-16 DIAGNOSIS — N920 Excessive and frequent menstruation with regular cycle: Secondary | ICD-10-CM | POA: Diagnosis not present

## 2018-05-20 ENCOUNTER — Ambulatory Visit
Admission: RE | Admit: 2018-05-20 | Discharge: 2018-05-20 | Disposition: A | Discharge status: Home / Self Care | Payer: 59 | Source: Ambulatory Visit | Attending: Obstetrics and Gynecology | Admitting: Obstetrics and Gynecology

## 2018-05-20 ENCOUNTER — Other Ambulatory Visit: Payer: Self-pay | Admitting: Obstetrics and Gynecology

## 2018-05-20 DIAGNOSIS — Z30431 Encounter for routine checking of intrauterine contraceptive device: Secondary | ICD-10-CM

## 2018-06-04 ENCOUNTER — Ambulatory Visit: Payer: 59 | Admitting: Family Medicine

## 2018-06-04 ENCOUNTER — Encounter: Payer: Self-pay | Admitting: Family Medicine

## 2018-06-04 VITALS — BP 132/88 | HR 90 | Wt 203.0 lb

## 2018-06-04 DIAGNOSIS — M722 Plantar fascial fibromatosis: Secondary | ICD-10-CM

## 2018-06-04 NOTE — Progress Notes (Signed)
Subjective:    Patient ID: Erika Larson, female    DOB: [birth year], 48 y.o.   MRN: [id number]  No chief complaint on file.   HPI Patient was seen today for acute concern.  R foot pain x a few wks. Pt notes pain along the bottom of the foot from heel to ball of foot.  Pt endorses pain first thing in the morning when stepping out of bed.  Also has pain throughout the day at times making it difficult to walk and causing pain in right leg.  Pt bought insoles for her shoes to see if this would help.  Pt endorses trying to stretch her foot.  She has also been using OTC rubs with no relief.  Pt works in a [employer] where she stands on concrete floors all day.  Pt denies injury, edema, calf pain or edema.  Past Medical History:  Diagnosis Date  . Anemia   . Arthritis   . Gall bladder stones 1998  . Hypertension     Allergies  Allergen Reactions  . Shellfish Allergy Anaphylaxis and Swelling  . Shellfish-Derived Products Anaphylaxis and Swelling    ROS General: Denies fever, chills, night sweats, changes in weight, changes in appetite HEENT: Denies headaches, ear pain, changes in vision, rhinorrhea, sore throat CV: Denies CP, palpitations, SOB, orthopnea Pulm: Denies SOB, cough, wheezing GI: Denies abdominal pain, nausea, vomiting, diarrhea, constipation GU: Denies dysuria, hematuria, frequency, vaginal discharge Msk: Denies muscle cramps, joint pains  +pain in bottom of foot Neuro: Denies weakness, numbness, tingling Skin: Denies rashes, bruising Psych: Denies depression, anxiety, hallucinations    Objective:    Blood pressure 132/88, pulse 90, weight 203 lb (92.1 kg), SpO2 98 %.  Gen. Pleasant, well-nourished, in no distress, normal affect   Lungs: no accessory muscle use, CTAB, no wheezes or rales Cardiovascular: RRR, no m/r/g, no peripheral edema Musculoskeletal: TTP of plantar fascia.  No edema, erythema, induration.  No TTP of calves. no deformities, no  cyanosis or clubbing, normal tone Neuro:  A&Ox3, CN II-XII intact, ambulating with a limp Skin:  Warm, no lesions/ rash   Wt Readings from Last 3 Encounters:  06/04/18 203 lb (92.1 kg)  04/24/18 207 lb (93.9 kg)  09/27/16 209 lb (94.8 kg)    Lab Results  Component Value Date   WBC 10.0 09/27/2016   HGB 12.1 09/27/2016   HCT 38.6 09/27/2016   PLT 279 09/27/2016   GLUCOSE 100 (H) 09/27/2016   ALT 17 09/27/2016   AST 19 09/27/2016   NA 138 09/27/2016   K 3.9 09/27/2016   CL 104 09/27/2016   CREATININE 0.93 09/27/2016   BUN 5 (L) 09/27/2016   CO2 25 09/27/2016   INR 0.99 08/06/2011    Assessment/Plan:  Plantar fasciitis, right  -Discussed various causes -Discussed treatment options -Rest, NSAIDs, stretching, ice, supportive shoes -Given stretching exercises -Given handout -Consider referral to sports medicine for continued symptoms  F/u PRN  Grier Mitts, MD

## 2018-06-04 NOTE — Patient Instructions (Signed)
Plantar Fasciitis    Plantar fasciitis is a painful foot condition that affects the heel. It occurs when the band of tissue that connects the toes to the heel bone (plantar fascia) becomes irritated. This can happen as the result of exercising too much or doing other repetitive activities (overuse injury).  The pain from plantar fasciitis can range from mild irritation to severe pain that makes it difficult to walk or move. The pain is usually worse in the morning after sleeping, or after sitting or lying down for a while. Pain may also be worse after long periods of walking or standing.  What are the causes?  This condition may be caused by:   Standing for long periods of time.   Wearing shoes that do not have good arch support.   Doing activities that put stress on joints (high-impact activities), including running, aerobics, and ballet.   Being overweight.   An abnormal way of walking (gait).   Tight muscles in the back of your lower leg (calf).   High arches in your feet.   Starting a new athletic activity.  What are the signs or symptoms?  The main symptom of this condition is heel pain. Pain may:   Be worse with first steps after a time of rest, especially in the morning after sleeping or after you have been sitting or lying down for a while.   Be worse after long periods of standing still.   Decrease after 30-45 minutes of activity, such as gentle walking.  How is this diagnosed?  This condition may be diagnosed based on your medical history and your symptoms. Your health care provider may ask questions about your activity level. Your health care provider will do a physical exam to check for:   A tender area on the bottom of your foot.   A high arch in your foot.   Pain when you move your foot.   Difficulty moving your foot.  You may have imaging tests to confirm the diagnosis, such as:   X-rays.   Ultrasound.   MRI.  How is this treated?  Treatment for plantar fasciitis depends on how  severe your condition is. Treatment may include:   Rest, ice, applying pressure (compression), and raising the affected foot (elevation). This may be called RICE therapy. Your health care provider may recommend RICE therapy along with over-the-counter pain medicines to manage your pain.   Exercises to stretch your calves and your plantar fascia.   A splint that holds your foot in a stretched, upward position while you sleep (night splint).   Physical therapy to relieve symptoms and prevent problems in the future.   Injections of steroid medicine (cortisone) to relieve pain and inflammation.   Stimulating your plantar fascia with electrical impulses (extracorporeal shock wave therapy). This is usually the last treatment option before surgery.   Surgery, if other treatments have not worked after 12 months.  Follow these instructions at home:    Managing pain, stiffness, and swelling   If directed, put ice on the painful area:  ? Put ice in a plastic bag, or use a frozen bottle of water.  ? Place a towel between your skin and the bag or bottle.  ? Roll the bottom of your foot over the bag or bottle.  ? Do this for 20 minutes, 2-3 times a day.   Wear athletic shoes that have air-sole or gel-sole cushions, or try wearing soft shoe inserts that are designed for plantar   fasciitis.   Raise (elevate) your foot above the level of your heart while you are sitting or lying down.  Activity   Avoid activities that cause pain. Ask your health care provider what activities are safe for you.   Do physical therapy exercises and stretches as told by your health care provider.   Try activities and forms of exercise that are easier on your joints (low-impact). Examples include swimming, water aerobics, and biking.  General instructions   Take over-the-counter and prescription medicines only as told by your health care provider.   Wear a night splint while sleeping, if told by your health care provider. Loosen the splint  if your toes tingle, become numb, or turn cold and blue.   Maintain a healthy weight, or work with your health care provider to lose weight as needed.   Keep all follow-up visits as told by your health care provider. This is important.  Contact a health care provider if you:   Have symptoms that do not go away after caring for yourself at home.   Have pain that gets worse.   Have pain that affects your ability to move or do your daily activities.  Summary   Plantar fasciitis is a painful foot condition that affects the heel. It occurs when the band of tissue that connects the toes to the heel bone (plantar fascia) becomes irritated.   The main symptom of this condition is heel pain that may be worse after exercising too much or standing still for a long time.   Treatment varies, but it usually starts with rest, ice, compression, and elevation (RICE therapy) and over-the-counter medicines to manage pain.  This information is not intended to replace advice given to you by your health care provider. Make sure you discuss any questions you have with your health care provider.  Document Released: 12/12/2000 Document Revised: 01/14/2017 Document Reviewed: 01/14/2017  Elsevier Interactive Patient Education  2019 [employer]        Plantar Fasciitis Rehab  Ask your health care provider which exercises are safe for you. Do exercises exactly as told by your health care provider and adjust them as directed. It is normal to feel mild stretching, pulling, tightness, or discomfort as you do these exercises, but you should stop right away if you feel sudden pain or your pain gets worse. Do not begin these exercises until told by your health care provider.  Stretching and range of motion exercises  These exercises warm up your muscles and joints and improve the movement and flexibility of your foot. These exercises also help to relieve pain.  Exercise A: Plantar fascia stretch    1. Sit with your left / right leg crossed  over your opposite knee.  2. Hold your heel with one hand with that thumb near your arch. With your other hand, hold your toes and gently pull them back toward the top of your foot. You should feel a stretch on the bottom of your toes or your foot or both.  3. Hold this stretch for__________ seconds.  4. Slowly release your toes and return to the starting position.  Repeat __________ times. Complete this exercise __________ times a day.  Exercise B: Gastroc, standing    1. Stand with your hands against a wall.  2. Extend your left / right leg behind you, and bend your front knee slightly.  3. Keeping your heels on the floor and keeping your back knee straight, shift your weight toward the   wall without arching your back. You should feel a gentle stretch in your left / right calf.  4. Hold this position for __________ seconds.  Repeat __________ times. Complete this exercise __________ times a day.  Exercise C: Soleus, standing  1. Stand with your hands against a wall.  2. Extend your left / right leg behind you, and bend your front knee slightly.  3. Keeping your heels on the floor, bend your back knee and slightly shift your weight over the back leg. You should feel a gentle stretch deep in your calf.  4. Hold this position for __________ seconds.  Repeat __________ times. Complete this exercise __________ times a day.  Exercise D: Gastrocsoleus, standing  1. Stand with the ball of your left / right foot on a step. The ball of your foot is on the walking surface, right under your toes.  2. Keep your other foot firmly on the same step.  3. Hold onto the wall or a railing for balance.  4. Slowly lift your other foot, allowing your body weight to press your heel down over the edge of the step. You should feel a stretch in your left / right calf.  5. Hold this position for __________ seconds.  6. Return both feet to the step.  7. Repeat this exercise with a slight bend in your left / right knee.  Repeat __________ times  with your left / right knee straight and __________ times with your left / right knee bent. Complete this exercise __________ times a day.  Balance exercise  This exercise builds your balance and strength control of your arch to help take pressure off your plantar fascia.  Exercise E: Single leg stand  1. Without shoes, stand near a railing or in a doorway. You may hold onto the railing or door frame as needed.  2. Stand on your left / right foot. Keep your big toe down on the floor and try to keep your arch lifted. Do not let your foot roll inward.  3. Hold this position for __________ seconds.  4. If this exercise is too easy, you can try it with your eyes closed or while standing on a pillow.  Repeat __________ times. Complete this exercise __________ times a day.  This information is not intended to replace advice given to you by your health care provider. Make sure you discuss any questions you have with your health care provider.  Document Released: 03/19/2005 Document Revised: 11/22/2015 Document Reviewed: 01/31/2015  Elsevier Interactive Patient Education  2019 [employer]

## 2018-07-03 DIAGNOSIS — Z30430 Encounter for insertion of intrauterine contraceptive device: Secondary | ICD-10-CM | POA: Diagnosis not present

## 2018-07-03 DIAGNOSIS — Z30431 Encounter for routine checking of intrauterine contraceptive device: Secondary | ICD-10-CM | POA: Diagnosis not present

## 2018-08-12 DIAGNOSIS — H10413 Chronic giant papillary conjunctivitis, bilateral: Secondary | ICD-10-CM | POA: Diagnosis not present

## 2018-10-29 ENCOUNTER — Other Ambulatory Visit: Payer: Self-pay

## 2018-10-29 ENCOUNTER — Encounter: Payer: Self-pay | Admitting: Family Medicine

## 2018-10-29 ENCOUNTER — Ambulatory Visit (INDEPENDENT_AMBULATORY_CARE_PROVIDER_SITE_OTHER): Payer: 59 | Admitting: Family Medicine

## 2018-10-29 VITALS — BP 118/80 | HR 83 | Temp 98.4°F | Ht 63.0 in | Wt 216.0 lb

## 2018-10-29 DIAGNOSIS — N939 Abnormal uterine and vaginal bleeding, unspecified: Secondary | ICD-10-CM | POA: Diagnosis not present

## 2018-10-29 DIAGNOSIS — N907 Vulvar cyst: Secondary | ICD-10-CM

## 2018-10-29 MED ORDER — DOXYCYCLINE HYCLATE 100 MG PO TABS: 100.0000 mg | ORAL_TABLET | Freq: Two times a day (BID) | ORAL | 0 refills | Status: AC

## 2018-10-29 NOTE — Patient Instructions (Signed)
Skin Abscess  A skin abscess is an infected area of your skin that contains pus and other material. An abscess can happen in any part of your body. Some abscesses break open (rupture) on their own. Most continue to get worse unless they are treated. The infection can spread deeper into the body and into your blood, which can make you feel sick. A skin abscess is caused by germs that enter the skin through a cut or scrape. It can also be caused by blocked oil and sweat glands or infected hair follicles. This condition is usually treated by:  Draining the pus.  Taking antibiotic medicines.  Placing a warm, wet washcloth over the abscess. Follow these instructions at home: Medicines   Take over-the-counter and prescription medicines only as told by your doctor.  If you were prescribed an antibiotic medicine, take it as told by your doctor. Do not stop taking the antibiotic even if you start to feel better. Abscess care   If you have an abscess that has not drained, place a warm, clean, wet washcloth over the abscess several times a day. Do this as told by your doctor.  Follow instructions from your doctor about how to take care of your abscess. Make sure you: ? Cover the abscess with a bandage (dressing). ? Change your bandage or gauze as told by your doctor. ? Wash your hands with soap and water before you change the bandage or gauze. If you cannot use soap and water, use hand sanitizer.  Check your abscess every day for signs that the infection is getting worse. Check for: ? More redness, swelling, or pain. ? More fluid or blood. ? Warmth. ? More pus or a bad smell. General instructions  To avoid spreading the infection: ? Do not share personal care items, towels, or hot tubs with others. ? Avoid making skin-to-skin contact with other people.  Keep all follow-up visits as told by your doctor. This is important. Contact a doctor if:  You have more redness, swelling, or pain  around your abscess.  You have more fluid or blood coming from your abscess.  Your abscess feels warm when you touch it.  You have more pus or a bad smell coming from your abscess.  You have a fever.  Your muscles ache.  You have chills.  You feel sick. Get help right away if:  You have very bad (severe) pain.  You see red streaks on your skin spreading away from the abscess. Summary  A skin abscess is an infected area of your skin that contains pus and other material.  The abscess is caused by germs that enter the skin through a cut or scrape. It can also be caused by blocked oil and sweat glands or infected hair follicles.  Follow your doctor's instructions on caring for your abscess, taking medicines, preventing infections, and keeping follow-up visits. This information is not intended to replace advice given to you by your health care provider. Make sure you discuss any questions you have with your health care provider. Document Released: 09/05/2007 Document Revised: 07/10/2018 Document Reviewed: 05/02/2017 Elsevier Patient Education  2020 [employer].

## 2018-10-30 ENCOUNTER — Encounter: Payer: Self-pay | Admitting: Family Medicine

## 2018-10-30 NOTE — Progress Notes (Signed)
Subjective:    Patient ID: Erika Larson, female    DOB: [birth year], 48 y.o.   MRN: [id number]  Chief Complaint  Patient presents with  . Bartholin's Cyst    Pt notice a bump near vaginal area and is concerned. Pt denies pain.   . Hematuria    HPI Patient worked in this afternoon for an acute concern.  Pt notes bump in vaginal area x 1 wk.  Not painful, not draining, without edema.  Pt notes she had a similar lesion which became larger and required I/&D a few yrs ago.  Pt also notes h/o small fibroids.  Seen by OB/Gyn.  IUD in place but endorses bleeding.  States is tired of bleeding and wants a hysterectomy.  Pt denies dizziness, lightheadedness, heavy bleeding, fatigue, palpitations.  Past Medical History:  Diagnosis Date  . Anemia   . Arthritis   . Gall bladder stones 1998  . Hypertension     Allergies  Allergen Reactions  . Shellfish Allergy Anaphylaxis and Swelling  . Shellfish-Derived Products Anaphylaxis and Swelling    ROS General: Denies fever, chills, night sweats, changes in weight, changes in appetite HEENT: Denies headaches, ear pain, changes in vision, rhinorrhea, sore throat CV: Denies CP, palpitations, SOB, orthopnea Pulm: Denies SOB, cough, wheezing GI: Denies abdominal pain, nausea, vomiting, diarrhea, constipation GU: Denies dysuria, hematuria, frequency, vaginal discharge  +increased vaginal bleeding Msk: Denies muscle cramps, joint pains Neuro: Denies weakness, numbness, tingling Skin: Denies rashes, bruising  +bump in vaginal area Psych: Denies depression, anxiety, hallucinations    Objective:    Blood pressure 118/80, pulse 83, temperature 98.4 F (36.9 C), temperature source Oral, height 5\' 3"  (1.6 m), weight 216 lb (98 kg), SpO2 100 %.  Gen. Pleasant, well-nourished, in no distress, normal affect   HEENT: [place]/AT, face symmetric, no scleral icterus, PERRLA, nares patent without drainage Lungs: no accessory muscle use, CTAB, no wheezes  or rales Cardiovascular: RRR, no peripheral edema Neuro:  A&Ox3, CN II-XII intact, normal gait Skin:  Warm, no lesions/ rash   Wt Readings from Last 3 Encounters:  10/29/18 216 lb (98 kg)  06/04/18 203 lb (92.1 kg)  04/24/18 207 lb (93.9 kg)    Lab Results  Component Value Date   WBC 10.0 09/27/2016   HGB 12.1 09/27/2016   HCT 38.6 09/27/2016   PLT 279 09/27/2016   GLUCOSE 100 (H) 09/27/2016   ALT 17 09/27/2016   AST 19 09/27/2016   NA 138 09/27/2016   K 3.9 09/27/2016   CL 104 09/27/2016   CREATININE 0.93 09/27/2016   BUN 5 (L) 09/27/2016   CO2 25 09/27/2016   INR 0.99 08/06/2011    Incision and Drainage Procedure Note  Pre-operative Diagnosis:  cyst  Post-operative Diagnosis: same  Indications: labial cyst  Anesthesia: 1% plain lidocaine  Procedure Details  The procedure, risks and complications have been discussed in detail (including, but not limited to airway compromise, infection, bleeding) with the patient, and the patient has signed consent to the procedure.  The skin was sterilely prepped and draped over the affected area in the usual fashion. After adequate local anesthesia, I&D with a #11 blade was performed on the left labia majora. Purulent drainage: absent The patient was observed until stable.  EBL: minimal cc's  Condition: Tolerated procedure well and Stable   Complications: none.   Assessment/Plan:  Labial cyst  -consent obtained.  I&D performed.  Pt tolerated procedure well - Plan: doxycycline (VIBRA-TABS) 100 MG tablet -given  handout -given precautions  Abnormal uterine bleeding (AUB) -possibly 2/2 fibroids -pt reports IUD in place -discussed need to f/u with OB/GYN to discuss other options   F/u prn  Grier Mitts, MD

## 2018-12-01 ENCOUNTER — Telehealth: Payer: Self-pay | Admitting: Family Medicine

## 2018-12-01 ENCOUNTER — Other Ambulatory Visit: Payer: Self-pay

## 2018-12-01 DIAGNOSIS — I10 Essential (primary) hypertension: Secondary | ICD-10-CM

## 2018-12-01 MED ORDER — LISINOPRIL 5 MG PO TABS: 5.0000 mg | ORAL_TABLET | Freq: Every day | ORAL | 4 refills | Status: DC

## 2018-12-01 NOTE — Telephone Encounter (Signed)
See note

## 2018-12-01 NOTE — Telephone Encounter (Signed)
Pt offered a visit with another provider, states that she will see Dr Elease Hashimoto tomorrow 12/02/2018 for her ear problems but she will wait to see Dr Volanda Napoleon if she has issues with the cyst

## 2018-12-01 NOTE — Telephone Encounter (Signed)
Medication Refill: lisinopril (PRINIVIL,ZESTRIL) 5 MG tablet [id number]   Pharmacy:  [id number] - [place], [place]. AT [address] [phone] (Phone) [phone] (Fax)

## 2018-12-01 NOTE — Telephone Encounter (Signed)
See request °

## 2018-12-01 NOTE — Telephone Encounter (Signed)
Rx sent to pt pharmacy 

## 2018-12-01 NOTE — Telephone Encounter (Signed)
Pt husband called and stated that pt came in on 10/29/18 for a removal of a bump. Pt husband stated that bump has come back and would like a call back from the nurse. Please advise

## 2018-12-02 ENCOUNTER — Other Ambulatory Visit: Payer: Self-pay

## 2018-12-02 ENCOUNTER — Ambulatory Visit: Payer: 59 | Admitting: Family Medicine

## 2018-12-02 ENCOUNTER — Encounter: Payer: Self-pay | Admitting: Family Medicine

## 2018-12-02 VITALS — BP 130/84 | HR 100 | Temp 98.6°F | Ht 63.0 in | Wt 218.0 lb

## 2018-12-02 DIAGNOSIS — H9202 Otalgia, left ear: Secondary | ICD-10-CM | POA: Diagnosis not present

## 2018-12-02 MED ORDER — DOXYCYCLINE HYCLATE 100 MG PO CAPS: 100.0000 mg | ORAL_CAPSULE | Freq: Two times a day (BID) | ORAL | 0 refills | Status: DC

## 2018-12-02 NOTE — Patient Instructions (Signed)
Try some warm compresses several times daily  Touch base in one week if not improving.

## 2018-12-02 NOTE — Progress Notes (Signed)
  Subjective:     Patient ID: Erika Larson, female   DOB: 30-Jun-1970, 48 y.o.   MRN: [id number]  HPI Patient relates some left ear pain with onset around last Thursday.  She denies any injury or any instrumentation of the ear.  No drainage.  No hearing loss.  She notices some pain when touching around the tragus area.  No fevers or chills.  Past Medical History:  Diagnosis Date  . Anemia   . Arthritis   . Gall bladder stones 1998  . Hypertension    Past Surgical History:  Procedure Laterality Date  . TUBAL LIGATION    . WISDOM TOOTH EXTRACTION      reports that she has been smoking. She has been smoking about 0.25 packs per day. She has never used smokeless tobacco. She reports current alcohol use. She reports that she does not use drugs. family history includes Arthritis in her mother; Cancer in her brother, mother, and sister; Hearing loss in her mother; Hypertension in her mother. Allergies  Allergen Reactions  . Shellfish Allergy Anaphylaxis and Swelling  . Shellfish-Derived Products Anaphylaxis and Swelling     Review of Systems  Constitutional: Negative for chills and fever.  HENT: Positive for ear pain. Negative for congestion, ear discharge and hearing loss.        Objective:   Physical Exam Constitutional:      Appearance: Normal appearance.  HENT:     Ears:     Comments: Right ear canal appears normal.  Left canal reveals some mild swelling and erythema along the inferior portion.  No pustules.  No fluctuance.  Eardrum is completely normal.  She has no cerumen. Cardiovascular:     Rate and Rhythm: Normal rate and regular rhythm.  Neurological:     Mental Status: She is alert.        Assessment:     Left ear canal pain.  Question small furuncle.  No fluctuance.  No evidence for otitis externa.    Plan:     -Recommend warm compresses several times daily -Doxycycline 100 mg twice daily with food for 10 days -Follow-up if not resolving over the  next week  Eulas Post MD [facility]

## 2019-02-20 NOTE — Patient Instructions (Addendum)
DUE TO COVID-19 ONLY ONE VISITOR IS ALLOWED TO COME WITH YOU AND STAY IN THE WAITING ROOM ONLY DURING PRE OP AND PROCEDURE DAY OF SURGERY. THE 1 VISITOR MAY VISIT WITH YOU AFTER SURGERY IN YOUR PRIVATE ROOM DURING VISITING HOURS ONLY!   ONCE YOUR COVID TEST IS COMPLETED, PLEASE BEGIN THE QUARANTINE INSTRUCTIONS AS OUTLINED IN YOUR HANDOUT.     [name]        Your procedure is scheduled on  02/25/19   Report to [place]  At  5:30  A.M.   Call this number if you have problems the morning of surgery:[phone]   OUR ADDRESS IS [address], WE ARE LOCATED IN THE [facility] WITH [facility].       Do not wear jewelry, make-up or nail polish.  Do not wear lotions, powders, or perfumes, or deoderant.  Do not shave 48 hours prior to surgery.    Do not bring valuables to the hospital.  [facility] is not responsible for any belongings or valuables.  Contacts, dentures or bridgework may not be worn into surgery.   For patients admitted to the hospital, discharge time will be determined by your treatment team.  Patients discharged the day of surgery will not be allowed to drive home.     Please read over the following fact sheets that you were given:  [place], NO CHEWING GUM CANDY OR MINTS.  Do not eat food After Midnight.   YOU MAY HAVE CLEAR LIQUIDS FROM MIDNIGHT UNTIL 4:30 AM.   At 4:30 AM Please finish the prescribed Pre-Surgery  drink.   Nothing by mouth after you finish the  drink !    Take these medicines the morning of surgery with A SIP OF WATER: Omeprazol                                Do not bring valuables to the hospital. [place].  Contacts, dentures or bridgework may not be worn into surgery.       Patients discharged the day of surgery will not be allowed to drive home.   IF YOU ARE HAVING SURGERY  AND GOING HOME THE SAME DAY, YOU MUST HAVE AN ADULT TO DRIVE YOU HOME AND BE WITH YOU FOR 24 HOURS.   YOU MAY GO HOME BY TAXI OR UBER OR ORTHERWISE, BUT AN ADULT MUST ACCOMPANY YOU HOME AND STAY WITH YOU FOR 24 HOURS.  Name and phone number of your driver:  Special Instructions: N/A              Please read over the following fact sheets you were given: _____________________________________________________________________             [facility] - Preparing for Surgery  Before surgery, you can play an important role.   Because skin is not sterile, your skin needs to be as free of germs as possible.   You can reduce the number of germs on your skin by washing with CHG (chlorahexidine gluconate) soap before surgery.   CHG is an antiseptic cleaner which kills germs and bonds with the skin to continue killing germs even after washing. Please DO NOT use if you have an allergy to CHG or antibacterial soaps.  If your skin becomes reddened/irritated stop using the CHG and inform your nurse when you arrive at Short Stay. Do not shave (including legs and underarms) for at least 48 hours prior to the first CHG shower.  Please follow these instructions carefully:  1.  Shower with CHG Soap the night before surgery and the  morning of Surgery.  2.  If you choose to wash your hair, wash your hair first as usual with your  normal  shampoo.  3.  After you shampoo, rinse your hair and body thoroughly to remove the  shampoo.                                        4.  Use CHG as you would any other liquid soap.  You can apply chg directly  to the skin and wash                       Gently with a scrungie or clean washcloth.  5.  Apply the CHG Soap to your body ONLY FROM THE NECK DOWN.   Do not use on face/ open                           Wound or open sores. Avoid contact with eyes, ears mouth and genitals (private parts).                       Wash face,  Genitals (private parts) with your normal soap.              6.  Wash thoroughly, paying special attention to the area where your surgery  will be performed.  7.  Thoroughly rinse your body with warm water from the neck down.  8.  DO NOT shower/wash with your normal soap after using and rinsing off  the CHG Soap.             9.  Pat yourself dry with a clean towel.            10.  Wear clean pajamas.            11.  Place clean sheets on your bed the night of your first shower and do not  sleep with pets. Day of Surgery : Do not apply any lotions/deodorants the morning of surgery.  Please wear clean clothes to the hospital/surgery center.  FAILURE TO FOLLOW THESE INSTRUCTIONS MAY RESULT IN THE CANCELLATION OF YOUR SURGERY PATIENT SIGNATURE_________________________________  NURSE SIGNATURE__________________________________  ________________________________________________________________________   Erika Larson  An incentive spirometer is a tool that can help keep your lungs clear and active. This tool measures how well you are filling your lungs with each breath. Taking long deep breaths may help reverse or decrease the chance of developing breathing (pulmonary) problems (especially infection) following:  A long period of time when you are unable to move or be active. BEFORE THE PROCEDURE   If the spirometer includes an indicator to show your best effort, your nurse or respiratory therapist will set it to a desired goal.  If possible, sit up straight or lean slightly forward. Try not to slouch.  Hold the incentive spirometer in an upright position. INSTRUCTIONS FOR USE  1. Sit on the edge of your bed if possible, or sit up as far as you can  in bed or on a chair. 2. Hold the incentive spirometer in an upright position. 3. Breathe out normally. 4. Place the mouthpiece in your mouth and seal your lips tightly around it. 5. Breathe in slowly and as deeply as possible, raising the piston or the ball toward the top of the  column. 6. Hold your breath for 3-5 seconds or for as long as possible. Allow the piston or ball to fall to the bottom of the column. 7. Remove the mouthpiece from your mouth and breathe out normally. 8. Rest for a few seconds and repeat Steps 1 through 7 at least 10 times every 1-2 hours when you are awake. Take your time and take a few normal breaths between deep breaths. 9. The spirometer may include an indicator to show your best effort. Use the indicator as a goal to work toward during each repetition. 10. After each set of 10 deep breaths, practice coughing to be sure your lungs are clear. If you have an incision (the cut made at the time of surgery), support your incision when coughing by placing a pillow or rolled up towels firmly against it. Once you are able to get out of bed, walk around indoors and cough well. You may stop using the incentive spirometer when instructed by your caregiver.  RISKS AND COMPLICATIONS  Take your time so you do not get dizzy or light-headed.  If you are in pain, you may need to take or ask for pain medication before doing incentive spirometry. It is harder to take a deep breath if you are having pain. AFTER USE  Rest and breathe slowly and easily.  It can be helpful to keep track of a log of your progress. Your caregiver can provide you with a simple table to help with this. If you are using the spirometer at home, follow these instructions: [place] IF:   You are having difficultly using the spirometer.  You have trouble using the spirometer as often as instructed.  Your pain medication is not giving enough relief while using the spirometer.  You develop fever of 100.5 F (38.1 C) or higher. SEEK IMMEDIATE MEDICAL CARE IF:   You cough up bloody sputum that had not been present before.  You develop fever of 102 F (38.9 C) or greater.  You develop worsening pain at or near the incision site. MAKE SURE YOU:   Understand these  instructions.  Will watch your condition.  Will get help right away if you are not doing well or get worse. Document Released: 07/30/2006 Document Revised: 06/11/2011 Document Reviewed: 09/30/2006 ExitCare Patient Information 2014 Exit[employer], [place].   ________________________________________________________________________  WHAT IS A BLOOD TRANSFUSION? Blood Transfusion Information  A transfusion is the replacement of blood or some of its parts. Blood is made up of multiple cells which provide different functions.  Red blood cells carry oxygen and are used for blood loss replacement.  White blood cells fight against infection.  Platelets control bleeding.  Plasma helps clot blood.  Other blood products are available for specialized needs, such as hemophilia or other clotting disorders. BEFORE THE TRANSFUSION  Who gives blood for transfusions?   Healthy volunteers who are fully evaluated to make sure their blood is safe. This is blood bank blood. Transfusion therapy is the safest it has ever been in the practice of medicine. Before blood is taken from a donor, a complete history is taken to make sure that person has no history of diseases nor engages  in risky social behavior (examples are intravenous drug use or sexual activity with multiple partners). The donor's travel history is screened to minimize risk of transmitting infections, such as malaria. The donated blood is tested for signs of infectious diseases, such as HIV and hepatitis. The blood is then tested to be sure it is compatible with you in order to minimize the chance of a transfusion reaction. If you or a relative donates blood, this is often done in anticipation of surgery and is not appropriate for emergency situations. It takes many days to process the donated blood. RISKS AND COMPLICATIONS Although transfusion therapy is very safe and saves many lives, the main dangers of transfusion include:   Getting an infectious  disease.  Developing a transfusion reaction. This is an allergic reaction to something in the blood you were given. Every precaution is taken to prevent this. The decision to have a blood transfusion has been considered carefully by your caregiver before blood is given. Blood is not given unless the benefits outweigh the risks. AFTER THE TRANSFUSION  Right after receiving a blood transfusion, you will usually feel much better and more energetic. This is especially true if your red blood cells have gotten low (anemic). The transfusion raises the level of the red blood cells which carry oxygen, and this usually causes an energy increase.  The nurse administering the transfusion will monitor you carefully for complications. HOME CARE INSTRUCTIONS  No special instructions are needed after a transfusion. You may find your energy is better. Speak with your caregiver about any limitations on activity for underlying diseases you may have. SEEK MEDICAL CARE IF:   Your condition is not improving after your transfusion.  You develop redness or irritation at the intravenous (IV) site. SEEK IMMEDIATE MEDICAL CARE IF:  Any of the following symptoms occur over the next 12 hours:  Shaking chills.  You have a temperature by mouth above 102 F (38.9 C), not controlled by medicine.  Chest, back, or muscle pain.  People around you feel you are not acting correctly or are confused.  Shortness of breath or difficulty breathing.  Dizziness and fainting.  You get a rash or develop hives.  You have a decrease in urine output.  Your urine turns a dark color or changes to pink, red, or brown. Any of the following symptoms occur over the next 10 days:  You have a temperature by mouth above 102 F (38.9 C), not controlled by medicine.  Shortness of breath.  Weakness after normal activity.  The white part of the eye turns yellow (jaundice).  You have a decrease in the amount of urine or are  urinating less often.  Your urine turns a dark color or changes to pink, red, or brown. Document Released: 03/16/2000 Document Revised: 06/11/2011 Document Reviewed: 11/03/2007 [facility] Patient Information 2014 [place], [place].  _______________________________________________________________________

## 2019-02-21 ENCOUNTER — Other Ambulatory Visit (HOSPITAL_COMMUNITY)
Admission: RE | Admit: 2019-02-21 | Discharge: 2019-02-21 | Disposition: A | Discharge status: Home / Self Care | Payer: 59 | Source: Ambulatory Visit | Attending: Obstetrics & Gynecology | Admitting: Obstetrics & Gynecology

## 2019-02-21 DIAGNOSIS — Z20828 Contact with and (suspected) exposure to other viral communicable diseases: Secondary | ICD-10-CM | POA: Insufficient documentation

## 2019-02-21 DIAGNOSIS — Z01812 Encounter for preprocedural laboratory examination: Secondary | ICD-10-CM | POA: Diagnosis present

## 2019-02-23 ENCOUNTER — Encounter (HOSPITAL_COMMUNITY)
Admission: RE | Admit: 2019-02-23 | Discharge: 2019-02-23 | Disposition: A | Discharge status: Home / Self Care | Payer: 59 | Source: Ambulatory Visit | Attending: Obstetrics & Gynecology | Admitting: Obstetrics & Gynecology

## 2019-02-23 ENCOUNTER — Other Ambulatory Visit: Payer: Self-pay

## 2019-02-23 ENCOUNTER — Encounter (HOSPITAL_COMMUNITY): Payer: Self-pay

## 2019-02-23 DIAGNOSIS — N920 Excessive and frequent menstruation with regular cycle: Secondary | ICD-10-CM | POA: Diagnosis not present

## 2019-02-23 DIAGNOSIS — I10 Essential (primary) hypertension: Secondary | ICD-10-CM | POA: Diagnosis not present

## 2019-02-23 DIAGNOSIS — Z01818 Encounter for other preprocedural examination: Secondary | ICD-10-CM | POA: Insufficient documentation

## 2019-02-23 LAB — CBC
HCT: 29.2 % — ABNORMAL LOW (ref 36.0–46.0)
Hemoglobin: 8.1 g/dL — ABNORMAL LOW (ref 12.0–15.0)
MCH: 20.9 pg — ABNORMAL LOW (ref 26.0–34.0)
MCHC: 27.7 g/dL — ABNORMAL LOW (ref 30.0–36.0)
MCV: 75.5 fL — ABNORMAL LOW (ref 80.0–100.0)
Platelets: 213 10*3/uL (ref 150–400)
RBC: 3.87 MIL/uL (ref 3.87–5.11)
RDW: 20.5 % — ABNORMAL HIGH (ref 11.5–15.5)
WBC: 5.6 10*3/uL (ref 4.0–10.5)
nRBC: 0 % (ref 0.0–0.2)

## 2019-02-23 LAB — BASIC METABOLIC PANEL
Anion gap: 8 (ref 5–15)
BUN: 12 mg/dL (ref 6–20)
CO2: 21 mmol/L — ABNORMAL LOW (ref 22–32)
Calcium: 8.4 mg/dL — ABNORMAL LOW (ref 8.9–10.3)
Chloride: 110 mmol/L (ref 98–111)
Creatinine, Ser: 0.82 mg/dL (ref 0.44–1.00)
GFR calc Af Amer: >60 mL/min (ref >60)
GFR calc non Af Amer: >60 mL/min (ref >60)
Glucose, Bld: 100 mg/dL — ABNORMAL HIGH (ref 70–99)
Potassium: 3.5 mmol/L (ref 3.5–5.1)
Sodium: 139 mmol/L (ref 135–145)

## 2019-02-23 LAB — NOVEL CORONAVIRUS, NAA (HOSP ORDER, SEND-OUT TO REF LAB; TAT 18-24 HRS): SARS-CoV-2, NAA: NOT DETECTED

## 2019-02-23 LAB — ABO/RH: ABO/RH(D): O POS

## 2019-02-23 MED ORDER — ENSURE PRE-SURGERY PO LIQD
296.0000 mL | Freq: Once | ORAL | Status: DC
  Filled 2019-02-23: qty 296

## 2019-02-23 NOTE — Progress Notes (Signed)
PCP - Dr. Lockie Pares Cardiologist - no  Chest x-ray - no EKG - 02/23/19 Stress Test - no ECHO - no Cardiac Cath - no  Sleep Study - no CPAP -   Fasting Blood Sugar - NA Checks Blood Sugar _____ times a day  Blood Thinner Instructions:NA Aspirin Instructions: Last Dose:  Anesthesia review:   Patient denies shortness of breath, fever, cough and chest pain at PAT appointment yes  Patient verbalized understanding of instructions that were given to them at the PAT appointment. Patient was also instructed that they will need to review over the PAT instructions again at home before surgery. yes

## 2019-02-25 ENCOUNTER — Observation Stay (HOSPITAL_BASED_OUTPATIENT_CLINIC_OR_DEPARTMENT_OTHER): Payer: 59 | Admitting: Anesthesiology

## 2019-02-25 ENCOUNTER — Encounter (HOSPITAL_BASED_OUTPATIENT_CLINIC_OR_DEPARTMENT_OTHER): Payer: Self-pay | Admitting: Obstetrics & Gynecology

## 2019-02-25 ENCOUNTER — Observation Stay (HOSPITAL_BASED_OUTPATIENT_CLINIC_OR_DEPARTMENT_OTHER): Payer: 59 | Admitting: Physician Assistant

## 2019-02-25 ENCOUNTER — Encounter (HOSPITAL_BASED_OUTPATIENT_CLINIC_OR_DEPARTMENT_OTHER)
Admission: RE | Disposition: A | Discharge status: Home / Self Care | Payer: Self-pay | Source: Home / Self Care | Attending: Obstetrics & Gynecology

## 2019-02-25 ENCOUNTER — Observation Stay (HOSPITAL_BASED_OUTPATIENT_CLINIC_OR_DEPARTMENT_OTHER)
Admission: RE | Admit: 2019-02-25 | Discharge: 2019-02-25 | Disposition: A | Discharge status: Home / Self Care | Payer: 59 | Attending: Obstetrics & Gynecology | Admitting: Obstetrics & Gynecology

## 2019-02-25 DIAGNOSIS — D259 Leiomyoma of uterus, unspecified: Secondary | ICD-10-CM | POA: Diagnosis not present

## 2019-02-25 DIAGNOSIS — N72 Inflammatory disease of cervix uteri: Secondary | ICD-10-CM | POA: Insufficient documentation

## 2019-02-25 DIAGNOSIS — M1990 Unspecified osteoarthritis, unspecified site: Secondary | ICD-10-CM | POA: Insufficient documentation

## 2019-02-25 DIAGNOSIS — D509 Iron deficiency anemia, unspecified: Secondary | ICD-10-CM | POA: Insufficient documentation

## 2019-02-25 DIAGNOSIS — I10 Essential (primary) hypertension: Secondary | ICD-10-CM | POA: Insufficient documentation

## 2019-02-25 DIAGNOSIS — Z87891 Personal history of nicotine dependence: Secondary | ICD-10-CM | POA: Insufficient documentation

## 2019-02-25 DIAGNOSIS — N938 Other specified abnormal uterine and vaginal bleeding: Principal | ICD-10-CM | POA: Insufficient documentation

## 2019-02-25 DIAGNOSIS — Z79899 Other long term (current) drug therapy: Secondary | ICD-10-CM | POA: Insufficient documentation

## 2019-02-25 DIAGNOSIS — N800 Endometriosis of uterus: Secondary | ICD-10-CM | POA: Diagnosis not present

## 2019-02-25 DIAGNOSIS — Z90710 Acquired absence of both cervix and uterus: Secondary | ICD-10-CM | POA: Diagnosis present

## 2019-02-25 HISTORY — PX: CYSTOSCOPY: SHX5120

## 2019-02-25 HISTORY — PX: ROBOTIC ASSISTED LAPAROSCOPIC HYSTERECTOMY AND SALPINGECTOMY: SHX6379

## 2019-02-25 LAB — POCT PREGNANCY, URINE: Preg Test, Ur: NEGATIVE

## 2019-02-25 LAB — PREPARE RBC (CROSSMATCH)

## 2019-02-25 SURGERY — XI ROBOTIC ASSISTED LAPAROSCOPIC HYSTERECTOMY AND SALPINGECTOMY
Anesthesia: General | Site: Ureter | Laterality: Bilateral

## 2019-02-25 MED ORDER — LACTATED RINGERS IV SOLN
INTRAVENOUS | Status: DC
  Administered 2019-02-25: 07:00:00 via INTRAVENOUS
  Filled 2019-02-25: qty 1000

## 2019-02-25 MED ORDER — ROCURONIUM BROMIDE 100 MG/10ML IV SOLN
INTRAVENOUS | Status: DC | PRN
  Administered 2019-02-25 (×2): 10 mg via INTRAVENOUS
  Administered 2019-02-25: 70 mg via INTRAVENOUS

## 2019-02-25 MED ORDER — GABAPENTIN 300 MG PO CAPS
300.0000 mg | ORAL_CAPSULE | ORAL | Status: AC
  Administered 2019-02-25: 06:00:00 300 mg via ORAL
  Filled 2019-02-25: qty 1

## 2019-02-25 MED ORDER — ONDANSETRON HCL 4 MG/2ML IJ SOLN
INTRAMUSCULAR | Status: DC | PRN
  Administered 2019-02-25: 4 mg via INTRAVENOUS

## 2019-02-25 MED ORDER — ACETAMINOPHEN 500 MG PO TABS
1000.0000 mg | ORAL_TABLET | ORAL | Status: AC
  Administered 2019-02-25: 06:00:00 1000 mg via ORAL
  Filled 2019-02-25: qty 2

## 2019-02-25 MED ORDER — DOCUSATE SODIUM 100 MG PO CAPS
ORAL_CAPSULE | ORAL | Status: AC
  Filled 2019-02-25: qty 1

## 2019-02-25 MED ORDER — CEFAZOLIN SODIUM-DEXTROSE 2-4 GM/100ML-% IV SOLN
2.0000 g | INTRAVENOUS | Status: AC
  Administered 2019-02-25: 2 g via INTRAVENOUS
  Filled 2019-02-25: qty 100

## 2019-02-25 MED ORDER — SODIUM CHLORIDE 0.9 % IR SOLN
Status: DC | PRN
  Administered 2019-02-25: 1000 mL via INTRAVESICAL

## 2019-02-25 MED ORDER — ACETAMINOPHEN 500 MG PO TABS
ORAL_TABLET | ORAL | Status: AC
  Filled 2019-02-25: qty 2

## 2019-02-25 MED ORDER — MIDAZOLAM HCL 2 MG/2ML IJ SOLN
INTRAMUSCULAR | Status: AC
  Filled 2019-02-25: qty 2

## 2019-02-25 MED ORDER — GABAPENTIN 300 MG PO CAPS
ORAL_CAPSULE | ORAL | Status: AC
  Filled 2019-02-25: qty 1

## 2019-02-25 MED ORDER — FENTANYL CITRATE (PF) 100 MCG/2ML IJ SOLN
INTRAMUSCULAR | Status: AC
  Filled 2019-02-25: qty 2

## 2019-02-25 MED ORDER — DEXAMETHASONE SODIUM PHOSPHATE 10 MG/ML IJ SOLN
INTRAMUSCULAR | Status: AC
  Filled 2019-02-25: qty 1

## 2019-02-25 MED ORDER — OXYCODONE HCL 5 MG/5ML PO SOLN
5.0000 mg | Freq: Once | ORAL | Status: DC | PRN
  Filled 2019-02-25: qty 5

## 2019-02-25 MED ORDER — OXYCODONE-ACETAMINOPHEN 5-325 MG PO TABS
ORAL_TABLET | ORAL | Status: AC
  Filled 2019-02-25: qty 1

## 2019-02-25 MED ORDER — SODIUM CHLORIDE 0.9% IV SOLUTION
Freq: Once | INTRAVENOUS | Status: DC
  Filled 2019-02-25: qty 250

## 2019-02-25 MED ORDER — FENTANYL CITRATE (PF) 100 MCG/2ML IJ SOLN
25.0000 ug | INTRAMUSCULAR | Status: DC | PRN
  Filled 2019-02-25: qty 1

## 2019-02-25 MED ORDER — DOCUSATE SODIUM 100 MG PO CAPS
100.0000 mg | ORAL_CAPSULE | Freq: Two times a day (BID) | ORAL | Status: DC
  Administered 2019-02-25: 100 mg via ORAL
  Filled 2019-02-25: qty 1

## 2019-02-25 MED ORDER — KETOROLAC TROMETHAMINE 30 MG/ML IJ SOLN
INTRAMUSCULAR | Status: DC | PRN
  Administered 2019-02-25: 30 mg via INTRAVENOUS

## 2019-02-25 MED ORDER — SCOPOLAMINE 1 MG/3DAYS TD PT72
1.0000 | MEDICATED_PATCH | TRANSDERMAL | Status: DC
  Administered 2019-02-25: 1.5 mg via TRANSDERMAL
  Filled 2019-02-25: qty 1

## 2019-02-25 MED ORDER — MIDAZOLAM HCL 5 MG/5ML IJ SOLN
INTRAMUSCULAR | Status: DC | PRN
  Administered 2019-02-25: 2 mg via INTRAVENOUS

## 2019-02-25 MED ORDER — ONDANSETRON HCL 4 MG PO TABS
4.0000 mg | ORAL_TABLET | Freq: Four times a day (QID) | ORAL | Status: DC | PRN
  Filled 2019-02-25: qty 1

## 2019-02-25 MED ORDER — ROPIVACAINE HCL 5 MG/ML IJ SOLN
INTRAMUSCULAR | Status: AC
  Filled 2019-02-25: qty 30

## 2019-02-25 MED ORDER — LIDOCAINE HCL (CARDIAC) PF 100 MG/5ML IV SOSY
PREFILLED_SYRINGE | INTRAVENOUS | Status: DC | PRN
  Administered 2019-02-25: 60 mg via INTRAVENOUS

## 2019-02-25 MED ORDER — PROPOFOL 10 MG/ML IV BOLUS
INTRAVENOUS | Status: DC | PRN
  Administered 2019-02-25: 70 mg via INTRAVENOUS
  Administered 2019-02-25: 130 mg via INTRAVENOUS

## 2019-02-25 MED ORDER — OXYCODONE-ACETAMINOPHEN 5-325 MG PO TABS: 1.0000 | ORAL_TABLET | ORAL | 0 refills | Status: DC | PRN

## 2019-02-25 MED ORDER — PANTOPRAZOLE SODIUM 40 MG PO TBEC
40.0000 mg | DELAYED_RELEASE_TABLET | Freq: Every day | ORAL | Status: DC
  Administered 2019-02-25: 40 mg via ORAL
  Filled 2019-02-25: qty 1

## 2019-02-25 MED ORDER — SODIUM CHLORIDE 0.9 % IV SOLN
INTRAVENOUS | Status: DC | PRN
  Administered 2019-02-25: 60 mL

## 2019-02-25 MED ORDER — IBUPROFEN 800 MG PO TABS
800.0000 mg | ORAL_TABLET | Freq: Three times a day (TID) | ORAL | Status: DC
  Administered 2019-02-25: 800 mg via ORAL
  Filled 2019-02-25: qty 1

## 2019-02-25 MED ORDER — HYDROMORPHONE HCL 1 MG/ML IJ SOLN
0.2000 mg | INTRAMUSCULAR | Status: DC | PRN
  Filled 2019-02-25: qty 1

## 2019-02-25 MED ORDER — DOCUSATE SODIUM 100 MG PO CAPS: 100.0000 mg | ORAL_CAPSULE | Freq: Every day | ORAL | 0 refills | Status: DC

## 2019-02-25 MED ORDER — BUPIVACAINE HCL (PF) 0.25 % IJ SOLN
INTRAMUSCULAR | Status: AC
  Filled 2019-02-25: qty 30

## 2019-02-25 MED ORDER — LIDOCAINE 2% (20 MG/ML) 5 ML SYRINGE
INTRAMUSCULAR | Status: AC
  Filled 2019-02-25: qty 5

## 2019-02-25 MED ORDER — BUPIVACAINE HCL (PF) 0.25 % IJ SOLN
INTRAMUSCULAR | Status: DC | PRN
  Administered 2019-02-25: 8 mL

## 2019-02-25 MED ORDER — ONDANSETRON HCL 4 MG/2ML IJ SOLN
INTRAMUSCULAR | Status: AC
  Filled 2019-02-25: qty 2

## 2019-02-25 MED ORDER — SIMETHICONE 80 MG PO CHEW
80.0000 mg | CHEWABLE_TABLET | Freq: Four times a day (QID) | ORAL | Status: DC | PRN
  Filled 2019-02-25: qty 1

## 2019-02-25 MED ORDER — FENTANYL CITRATE (PF) 100 MCG/2ML IJ SOLN
INTRAMUSCULAR | Status: DC | PRN
  Administered 2019-02-25 (×6): 50 ug via INTRAVENOUS

## 2019-02-25 MED ORDER — SIMETHICONE 80 MG PO CHEW: 80.0000 mg | CHEWABLE_TABLET | Freq: Four times a day (QID) | ORAL | 0 refills | Status: DC | PRN

## 2019-02-25 MED ORDER — ROCURONIUM BROMIDE 10 MG/ML (PF) SYRINGE
PREFILLED_SYRINGE | INTRAVENOUS | Status: AC
  Filled 2019-02-25: qty 10

## 2019-02-25 MED ORDER — KETOROLAC TROMETHAMINE 30 MG/ML IJ SOLN
INTRAMUSCULAR | Status: AC
  Filled 2019-02-25: qty 1

## 2019-02-25 MED ORDER — INDIGOTINDISULFONATE SODIUM 8 MG/ML IJ SOLN
INTRAMUSCULAR | Status: DC | PRN
  Administered 2019-02-25: 5 mL via INTRAVENOUS

## 2019-02-25 MED ORDER — OXYCODONE-ACETAMINOPHEN 5-325 MG PO TABS
1.0000 | ORAL_TABLET | ORAL | Status: DC | PRN
  Administered 2019-02-25 (×2): 1 via ORAL
  Filled 2019-02-25: qty 2

## 2019-02-25 MED ORDER — DEXAMETHASONE SODIUM PHOSPHATE 4 MG/ML IJ SOLN
INTRAMUSCULAR | Status: DC | PRN
  Administered 2019-02-25: 10 mg via INTRAVENOUS

## 2019-02-25 MED ORDER — IBUPROFEN 800 MG PO TABS: 800.0000 mg | ORAL_TABLET | Freq: Three times a day (TID) | ORAL | 0 refills | Status: DC | PRN

## 2019-02-25 MED ORDER — PANTOPRAZOLE SODIUM 40 MG PO TBEC
DELAYED_RELEASE_TABLET | ORAL | Status: AC
  Filled 2019-02-25: qty 1

## 2019-02-25 MED ORDER — SCOPOLAMINE 1 MG/3DAYS TD PT72
MEDICATED_PATCH | TRANSDERMAL | Status: AC
  Filled 2019-02-25: qty 1

## 2019-02-25 MED ORDER — PROPOFOL 10 MG/ML IV BOLUS
INTRAVENOUS | Status: AC
  Filled 2019-02-25: qty 40

## 2019-02-25 MED ORDER — IBUPROFEN 800 MG PO TABS
ORAL_TABLET | ORAL | Status: AC
  Filled 2019-02-25: qty 1

## 2019-02-25 MED ORDER — CEFAZOLIN SODIUM-DEXTROSE 2-4 GM/100ML-% IV SOLN
INTRAVENOUS | Status: AC
  Filled 2019-02-25: qty 100

## 2019-02-25 MED ORDER — OXYCODONE HCL 5 MG PO TABS
5.0000 mg | ORAL_TABLET | Freq: Once | ORAL | Status: DC | PRN
  Filled 2019-02-25: qty 1

## 2019-02-25 MED ORDER — ONDANSETRON HCL 4 MG/2ML IJ SOLN
4.0000 mg | Freq: Four times a day (QID) | INTRAMUSCULAR | Status: DC | PRN
  Filled 2019-02-25: qty 2

## 2019-02-25 MED ORDER — SUGAMMADEX SODIUM 200 MG/2ML IV SOLN
INTRAVENOUS | Status: DC | PRN
  Administered 2019-02-25: 225 mg via INTRAVENOUS

## 2019-02-25 MED ORDER — SODIUM CHLORIDE (PF) 0.9 % IJ SOLN
INTRAMUSCULAR | Status: AC
  Filled 2019-02-25: qty 50

## 2019-02-25 SURGICAL SUPPLY — 57 items
ADH SKN CLS APL DERMABOND .7 (GAUZE/BANDAGES/DRESSINGS) ×2
BARRIER ADHS 3X4 INTERCEED (GAUZE/BANDAGES/DRESSINGS) IMPLANT
BRR ADH 4X3 ABS CNTRL BYND (GAUZE/BANDAGES/DRESSINGS)
CANISTER SUCT 3000ML PPV (MISCELLANEOUS) ×2 IMPLANT
COVER BACK TABLE 60X90IN (DRAPES) ×4 IMPLANT
COVER TIP SHEARS 8 DVNC (MISCELLANEOUS) ×2 IMPLANT
COVER TIP SHEARS 8MM DA VINCI (MISCELLANEOUS) ×2
DECANTER SPIKE VIAL GLASS SM (MISCELLANEOUS) ×4 IMPLANT
DEFOGGER SCOPE WARMER CLEARIFY (MISCELLANEOUS) ×4 IMPLANT
DERMABOND ADVANCED (GAUZE/BANDAGES/DRESSINGS) ×2
DERMABOND ADVANCED .7 DNX12 (GAUZE/BANDAGES/DRESSINGS) ×2 IMPLANT
DRAPE ARM DVNC X/XI (DISPOSABLE) ×8 IMPLANT
DRAPE COLUMN DVNC XI (DISPOSABLE) ×2 IMPLANT
DRAPE DA VINCI XI ARM (DISPOSABLE) ×8
DRAPE DA VINCI XI COLUMN (DISPOSABLE) ×2
DURAPREP 26ML APPLICATOR (WOUND CARE) ×4 IMPLANT
ELECT REM PT RETURN 9FT ADLT (ELECTROSURGICAL) ×4
ELECTRODE REM PT RTRN 9FT ADLT (ELECTROSURGICAL) ×2 IMPLANT
GLOVE BIO SURGEON STRL SZ 6 (GLOVE) ×16 IMPLANT
GLOVE BIO SURGEON STRL SZ7 (GLOVE) ×14 IMPLANT
GLOVE BIOGEL PI IND STRL 6 (GLOVE) ×6 IMPLANT
GLOVE BIOGEL PI IND STRL 7.0 (GLOVE) ×4 IMPLANT
GLOVE BIOGEL PI INDICATOR 6 (GLOVE) ×6
GLOVE BIOGEL PI INDICATOR 7.0 (GLOVE) ×6
IRRIG SUCT STRYKERFLOW 2 WTIP (MISCELLANEOUS) ×4
IRRIGATION SUCT STRKRFLW 2 WTP (MISCELLANEOUS) ×2 IMPLANT
LEGGING LITHOTOMY PAIR STRL (DRAPES) ×4 IMPLANT
MANIFOLD NEPTUNE II (INSTRUMENTS) ×3 IMPLANT
MANIPULATOR ADVINCU DEL 2.5 PL (MISCELLANEOUS) IMPLANT
MANIPULATOR ADVINCU DEL 3.0 PL (MISCELLANEOUS) IMPLANT
MANIPULATOR ADVINCU DEL 3.5 PL (MISCELLANEOUS) IMPLANT
MANIPULATOR ADVINCU DEL 4.0 PL (MISCELLANEOUS) IMPLANT
NEEDLE INSUFFLATION 120MM (ENDOMECHANICALS) ×4 IMPLANT
OBTURATOR OPTICAL STANDARD 8MM (TROCAR) ×2
OBTURATOR OPTICAL STND 8 DVNC (TROCAR) ×2
OBTURATOR OPTICALSTD 8 DVNC (TROCAR) ×2 IMPLANT
PACK ROBOT WH (CUSTOM PROCEDURE TRAY) ×4 IMPLANT
PACK ROBOTIC GOWN (GOWN DISPOSABLE) ×4 IMPLANT
PACK TRENDGUARD 450 HYBRID PRO (MISCELLANEOUS) IMPLANT
PAD PREP 24X48 CUFFED NSTRL (MISCELLANEOUS) ×4 IMPLANT
POUCH LAPAROSCOPIC INSTRUMENT (MISCELLANEOUS) ×4 IMPLANT
PROTECTOR NERVE ULNAR (MISCELLANEOUS) ×8 IMPLANT
SEAL CANN UNIV 5-8 DVNC XI (MISCELLANEOUS) ×6 IMPLANT
SEAL XI 5MM-8MM UNIVERSAL (MISCELLANEOUS) ×6
SET IRRIG Y TYPE TUR BLADDER L (SET/KITS/TRAYS/PACK) ×4 IMPLANT
SET TRI-LUMEN FLTR TB AIRSEAL (TUBING) ×4 IMPLANT
SUT VIC AB 0 CT1 36 (SUTURE) ×4 IMPLANT
SUT VIC AB 2-0 UR6 27 (SUTURE) ×4 IMPLANT
SUT VICRYL RAPIDE 3 0 (SUTURE) ×8 IMPLANT
SUT VLOC 180 0 9IN  GS21 (SUTURE) ×2
SUT VLOC 180 0 9IN GS21 (SUTURE) ×2 IMPLANT
TOWEL OR 17X26 10 PK STRL BLUE (TOWEL DISPOSABLE) ×8 IMPLANT
TRAY FOLEY W/BAG SLVR 14FR (SET/KITS/TRAYS/PACK) ×4 IMPLANT
TRENDGUARD 450 HYBRID PRO PACK (MISCELLANEOUS)
TROCAR BLADELESS OPT 5 100 (ENDOMECHANICALS) IMPLANT
TROCAR PORT AIRSEAL 5X120 (TROCAR) ×4 IMPLANT
WATER STERILE IRR 1000ML POUR (IV SOLUTION) ×4 IMPLANT

## 2019-02-25 NOTE — Anesthesia Preprocedure Evaluation (Addendum)
Anesthesia Evaluation  Patient identified by MRN, date of birth, ID band Patient awake    Reviewed: Allergy & Precautions, NPO status , Patient's Chart, lab work & pertinent test results  History of Anesthesia Complications Negative for: history of anesthetic complications  Airway Mallampati: II  TM Distance: >3 FB Neck ROM: Full    Dental  (+) Dental Advisory Given   Pulmonary neg recent URI, former smoker,    breath sounds clear to auscultation       Cardiovascular hypertension, Pt. on medications (-) angina(-) Past MI and (-) CHF  Rhythm:Regular     Neuro/Psych negative neurological ROS  negative psych ROS   GI/Hepatic negative GI ROS, Neg liver ROS,   Endo/Other  neg diabetesMorbid obesity  Renal/GU negative Renal ROS     Musculoskeletal  (+) Arthritis ,   Abdominal   Peds  Hematology  (+) Blood dyscrasia, anemia , hgb 8   Anesthesia Other Findings   Reproductive/Obstetrics                            Anesthesia Physical Anesthesia Plan  ASA: II  Anesthesia Plan: General   Post-op Pain Management:    Induction: Intravenous  PONV Risk Score and Plan: 3 and Ondansetron, Dexamethasone and Propofol infusion  Airway Management Planned: Oral ETT  Additional Equipment: None  Intra-op Plan:   Post-operative Plan: Extubation in OR  Informed Consent: I have reviewed the patients History and Physical, chart, labs and discussed the procedure including the risks, benefits and alternatives for the proposed anesthesia with the patient or authorized representative who has indicated his/her understanding and acceptance.     Dental advisory given  Plan Discussed with: CRNA and Surgeon  Anesthesia Plan Comments: (Type and screen )        Anesthesia Quick Evaluation

## 2019-02-25 NOTE — Op Note (Addendum)
PATIENT:  Erika Larson  48 y.o. female  PRE-OPERATIVE DIAGNOSIS:  dysfunctional uterine bleeding  POST-OPERATIVE DIAGNOSIS:  dysfunctional uterine bleeding  PROCEDURE:  Procedure(s): XI ROBOTIC ASSISTED LAPAROSCOPIC HYSTERECTOMY AND SALPINGECTOMY (Bilateral) CYSTOSCOPY (Bilateral)  SURGEON:  Surgeon(s) and Role:    * Taam-Akelman, Lawrence Santiago, MD - Primary    * Bobbye Charleston, MD - Assisting  ANESTHESIA:   general  EBL:  50 mL   BLOOD ADMINISTERED:none  DRAINS: none   LOCAL MEDICATIONS USED:  Ropivicaine, marcaine, indigo carmine  ANTIBIOTICS: Ancef   SPECIMEN: Uterus, cervix, bilateral fallopian tubes. Uterus weighing 286.9g  DISPOSITION OF SPECIMEN:  PATHOLOGY  COUNTS:  YES  TOURNIQUET:  * No tourniquets in log *  PLAN OF CARE: Discharge to home after PACU vs. Overnight observation  PATIENT DISPOSITION:  PACU - hemodynamically stable.   Delay start of Pharmacological VTE agent (>24hrs) due to surgical blood loss or risk of bleeding: not applicable  Complications: None  Findings: 10 week size uterus. Right ovary with simple cyst, left ovary normal. Ureters identified multiple points of the case and were out of the filed of dissection. On cystoscopy the bladder was intact and bilateral spill was seen from each ureteral orifcace.  Description of procedure:  After consent was verified the patient was taken to the operating room.  After adequate anesthesia was achieved the patient was positioned prepped and draped in the usual sterile fashion.  Exam revealed a 10 week size mobile uterus. A speculum was placed in the vagina and the uterus was sounded to 10cm and the cervix dilated with Kennon Rounds dilators.  The 3 cm KOH ring at the insula was assembled and placed in the proper fashion.  The speculum was removed and the bladder was drained with a Foley catheter.  Attention was turned to the abdomen where  a 1 cm incision was made 1 cm above the  umbilicus.  The 5 mm Optiview was used for direct entry, and the abdomen was insufflated.  The 8.4mm robotic trochars were placed 10 cm from the midline incision.  A 5 mm assistant port was placed 3 cm above the left robotic trocar.  The Optiview trocar was replaced with an 8.57mm robotic trocar.  All trochars were inserted under direct visualization of the camera.  The patient was placed in Trendelenburg and the robot was docked.  The fenestrated bipolar was placed on arm 1 and the monopolar scissors on arm 3 and introduced under direct visualization of the camera.  Survey of the abdomen revealed a uterus with a fundal fibroid and minimal adhesions. Right ovary with simple cyst, left ovary normal. Ureters identified multiple points of the case and were out of the filed of dissection. Evidence of prior bilateral tubal ligation. I then broke scrub and sat on the console.   The left fallopian tube was isolated and cauterized with the bipolar.  The round ligament was divided with the bipolar cautery in shoes.  The utero-ovarian ligament was then divided with the bipolar cautery and shears.  The posterior broad ligament was then divided with the monopolar scissors to the level of the uterosacral ligament.  The anterior leaf of the broad ligament was developed pass the KOH ring for the bladder flap. The uterine artery was cuaterized  The right tube was then cauterized with bipolar and divided with shears followed by the round ligament and the utero-ovarian ligament both divided with the bipolar forceps and scissors.  The posterior leaf was the divided to the level  of the utero sacral ligament. The anterior leaf was developed past the KOH ring for the bladder flap. The right uterine artery was cauterized.  The ureters were identified well out of the field of dissection. Both uterine arteries were then ligated.   The bladder was retracted inferiorly and was removed 1 cm below the KOH ring.  The monopolar scissors were  then used for the colpotomy, incising the vagina at the level of the KOH ring circumferentially.  After the uterus and cervix were amputated the cuff was inspected and noted to be hemostatic.  The uterus was removed through the vagina and had to be bivalved using the scalpel at the level of the vagina due to the large fundal fibroid.   The scissors were exchanged for the Mega suture cut needle driver and the cuff was closed with 0 Vicryl V-Loc in a running fashion. The cuff and pedicles were hemostatic. Irrigation was performed. The ureters were peristalsing bilaterally and lateral to the pedicles. Pressure was reduced and hemostasis was noted again. Ropivicaine was introduced in the pelvis.  The robot was undocked and I scrubbed back in. The skin incisions were closed with 3-0 vicryl rapide in a subcuticular fashion. Dermabond was placed. All instruments were removed from the vagina and the vagina was intact on speculum exam and by palpation. Cystoscopy was performed revealing an intact bladder and bilateral spill from the ureters. The cystoscopy was removed. Foley replaced. The patient taken to the recovery room in a stable condition.   Teja Costen K Taam-Akelman 02/25/19 1:52 PM
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
## 2019-02-25 NOTE — Brief Op Note (Signed)
02/25/2019  10:23 AM  PATIENT:  Erika Larson  48 y.o. female  PRE-OPERATIVE DIAGNOSIS:  dysfunctional uterine bleeding  POST-OPERATIVE DIAGNOSIS:  dysfunctional uterine bleeding  PROCEDURE:  Procedure(s): XI ROBOTIC ASSISTED LAPAROSCOPIC HYSTERECTOMY AND SALPINGECTOMY (Bilateral) CYSTOSCOPY (Bilateral)  SURGEON:  Surgeon(s) and Role:    * Taam-Akelman, Lawrence Santiago, MD - Primary    * Bobbye Charleston, MD - Assisting  ANESTHESIA:   general  EBL:  50 mL   BLOOD ADMINISTERED:none  DRAINS: none   LOCAL MEDICATIONS USED:  MARCAINE     SPECIMEN: Uterus, cervix, bilateral fallopian tubes  DISPOSITION OF SPECIMEN:  PATHOLOGY  COUNTS:  YES  TOURNIQUET:  * No tourniquets in log *  DICTATION: .Note written in EPIC  PLAN OF CARE: Discharge to home after PACU  PATIENT DISPOSITION:  PACU - hemodynamically stable.   Delay start of Pharmacological VTE agent (>24hrs) due to surgical blood loss or risk of bleeding: not applicable

## 2019-02-25 NOTE — Transfer of Care (Signed)
Immediate Anesthesia Transfer of Care Note  Patient: Erika Larson  Procedure(s) Performed: Procedure(s) (LRB): XI ROBOTIC ASSISTED LAPAROSCOPIC HYSTERECTOMY AND SALPINGECTOMY (Bilateral) CYSTOSCOPY (Bilateral)  Patient Location: PACU  Anesthesia Type: General  Level of Consciousness: awake, sedated, patient cooperative and responds to stimulation  Airway & Oxygen Therapy: Patient Spontanous Breathing and Patient connected to [place] 02 and soft FM   Post-op Assessment: Report given to PACU RN, Post -op Vital signs reviewed and stable and Patient moving all extremities  Post vital signs: Reviewed and stable  Complications: No apparent anesthesia complications

## 2019-02-25 NOTE — Discharge Instructions (Signed)
Prescriptions Motrin 800mg  every 8 hours for pain Acetaminophen 650mg  every 6 hours for moderate pain Percocet (Oxycodone 5mg -acetaminophen 325mg ) every 4 hours for severe pain.  Make sure to not exceed acetaminophen 3000mg  every day.  Robotic Assisted Laparoscopic Hysterectomy, Care After This sheet gives you information about how to care for yourself after your procedure. Your health care provider may also give you more specific instructions. If you have problems or questions, contact your health care provider. What can I expect after the procedure? After the procedure, it is common to have:  Soreness and numbness in your incision areas.  Abdominal pain. You will be given pain medicine to control it.  Vaginal bleeding and discharge. You will need to use a sanitary napkin after this procedure.  Sore throat from the breathing tube that was inserted during surgery. Follow these instructions at home: Medicines  Take over-the-counter and prescription medicines only as told by your health care provider.  Do not drive or use heavy machinery while taking prescription pain medicine.  Do not drive for 24 hours if you were given a medicine to help you relax (sedative) during the procedure. Incision care   Follow instructions from your health care provider about how to take care of your incisions. Make sure you: ? Wash your hands with soap and water before you change your bandage (dressing). If soap and water are not available, use hand sanitizer. ? Change your dressing as told by your health care provider. ? Leave stitches (sutures), skin glue, or adhesive strips in place. These skin closures may need to stay in place for 2 weeks or longer. If adhesive strip edges start to loosen and curl up, you may trim the loose edges. Do not remove adhesive strips completely unless your health care provider tells you to do that.  Check your incision area every day for signs of infection. Check  for: ? Redness, swelling, or pain. ? Fluid or blood. ? Warmth. ? Pus or a bad smell. Activity  Get regular exercise as told by your health care provider. You may be told to take short walks every day and go farther each time.  Return to your normal activities as told by your health care provider. Ask your health care provider what activities are safe for you.  Do not douche, use tampons, or have sexual intercourse for at least 6 weeks, or until your health care provider gives you permission.  Do not lift anything that is heavier than 10 lb (4.5 kg), or the limit that your health care provider tells you, until he or she says that it is safe. General instructions  Do not take baths, swim, or use a hot tub until your health care provider approves. Take showers instead of baths.  Do not drive for 24 hours if you received a sedative.  Do not drive or operate heavy machinery while taking prescription pain medicine.  To prevent or treat constipation while you are taking prescription pain medicine, your health care provider may recommend that you: ? Drink enough fluid to keep your urine clear or pale yellow. ? Take over-the-counter or prescription medicines. ? Eat foods that are high in fiber, such as fresh fruits and vegetables, whole grains, and beans. ? Limit foods that are high in fat and processed sugars, such as fried and sweet foods.  Keep all follow-up visits as told by your health care provider. This is important. Contact a health care provider if:  You have signs of infection, such as: ?  Redness, swelling, or pain around your incision sites. ? Fluid or blood coming from an incision. ? An incision that feels warm to the touch. ? Pus or a bad smell coming from an incision.  Your incision breaks open.  Your pain medicine is not helping.  You feel dizzy or light-headed.  You have pain or bleeding when you urinate.  You have persistent nausea and vomiting.  You have blood,  pus, or a bad-smelling discharge from your vagina. Get help right away if:  You have a fever.  You have severe abdominal pain.  You have chest pain.  You have shortness of breath.  You faint.  You have pain, swelling, or redness in your leg.  You have heavy bleeding from your vagina. Summary  After the procedure, it is common to have abdominal pain and vaginal bleeding.  You should not drive or lift heavy objects until your health care provider says that it is safe.  Contact your health care provider if you have any symptoms of infection, excessive vaginal bleeding, nausea, vomiting, or shortness of breath. This information is not intended to replace advice given to you by your health care provider. Make sure you discuss any questions you have with your health care provider. Document Released: 03/08/2011 Document Revised: 03/01/2017 Document Reviewed: 05/15/2016 Elsevier Patient Education  [facility] Instructions  Activity: Get plenty of rest for the remainder of the day. A responsible individual must stay with you for 24 hours following the procedure.  For the next 24 hours, DO NOT: -Drive a car -Paediatric nurse -Drink alcoholic beverages -Take any medication unless instructed by your physician -Make any legal decisions or sign important papers.  Meals: Start with liquid foods such as gelatin or soup. Progress to regular foods as tolerated. Avoid greasy, spicy, heavy foods. If nausea and/or vomiting occur, drink only clear liquids until the nausea and/or vomiting subsides. Call your physician if vomiting continues.  Special Instructions/Symptoms: Your throat may feel dry or sore from the anesthesia or the breathing tube placed in your throat during surgery. If this causes discomfort, gargle with warm salt water. The discomfort should disappear within 24 hours.  If you had a scopolamine patch placed behind your ear for the management  of post- operative nausea and/or vomiting:  1. The medication in the patch is effective for 72 hours, after which it should be removed.  Wrap patch in a tissue and discard in the trash. Wash hands thoroughly with soap and water. 2. You may remove the patch earlier than 72 hours if you experience unpleasant side effects which may include dry mouth, dizziness or visual disturbances. 3. Avoid touching the patch. Wash your hands with soap and water after contact with the patch.

## 2019-02-25 NOTE — Anesthesia Procedure Notes (Signed)
Procedure Name: Intubation Date/Time: 02/25/2019 7:38 AM Performed by: Justice Rocher, CRNA Pre-anesthesia Checklist: Patient identified, Emergency Drugs available, Suction available and Patient being monitored Patient Re-evaluated:Patient Re-evaluated prior to induction Oxygen Delivery Method: Circle system utilized Preoxygenation: Pre-oxygenation with 100% oxygen Induction Type: IV induction Ventilation: Mask ventilation without difficulty Laryngoscope Size: Mac and 3 Grade View: Grade II Tube type: Oral Tube size: 7.5 mm Number of attempts: 1 Airway Equipment and Method: Stylet and Oral airway Placement Confirmation: ETT inserted through vocal cords under direct vision,  positive ETCO2 and breath sounds checked- equal and bilateral Secured at: 23 cm Tube secured with: Tape Dental Injury: Teeth and Oropharynx as per pre-operative assessment

## 2019-02-27 LAB — SURGICAL PATHOLOGY

## 2019-02-27 NOTE — Discharge Summary (Signed)
    GYN Discharge Summary     Patient Name: Erika Larson DOB: 27-Jan-1971 MRN: [id number]  Date of admission: 02/25/2019 Date of discharge: 02/25/2019  Admitting diagnosis: dysfunctional uterine bleeding   Discharge diagnosis: Status post hysterectomy                                 Hospital course: Erika Larson was admitted 02/25/2019 for planned robotic assisted total laparoscopic hysterectomy. See Operative note for details. Postop she was observed in the PACU. Foley was removed, voided spontaneously, pain controlled and vital signs WNL. She was discharged home in stable condition POD#0.   Physical exam  Vitals:   02/25/19 1200 02/25/19 1300 02/25/19 1646 02/25/19 1800  BP: 121/74 116/67 129/81 128/77  Pulse: 73 71 60 71  Resp: 18 16 18 16   Temp: 98.1 F (36.7 C) 98.2 F (36.8 C) 98.3 F (36.8 C) 98.3 F (36.8 C)  TempSrc:      SpO2: 99% 96% 100% 100%  Weight:      Height:       Labs: Lab Results  Component Value Date   WBC 5.6 02/23/2019   HGB 8.1 (L) 02/23/2019   HCT 29.2 (L) 02/23/2019   MCV 75.5 (L) 02/23/2019   PLT 213 02/23/2019   CMP Latest Ref Rng & Units 02/23/2019  Glucose 70 - 99 mg/dL 100(H)  BUN 6 - 20 mg/dL 12  Creatinine 0.44 - 1.00 mg/dL 0.82  Sodium 135 - 145 mmol/L 139  Potassium 3.5 - 5.1 mmol/L 3.5  Chloride 98 - 111 mmol/L 110  CO2 22 - 32 mmol/L 21(L)  Calcium 8.9 - 10.3 mg/dL 8.4(L)  Total Protein 6.5 - 8.1 g/dL -  Total Bilirubin 0.3 - 1.2 mg/dL -  Alkaline Phos 38 - 126 U/L -  AST 15 - 41 U/L -  ALT 14 - 54 U/L -    Discharge instruction: per After Visit Summary  After visit meds:  Allergies as of 02/25/2019      Reactions   Shellfish Allergy Anaphylaxis, Swelling      Medication List    STOP taking these medications   doxycycline 100 MG capsule Commonly known as: VIBRAMYCIN     TAKE these medications   docusate sodium 100 MG capsule Commonly known as: Colace Take 1 capsule (100 mg total) by  mouth daily.   ibuprofen 800 MG tablet Commonly known as: ADVIL Take 1 tablet (800 mg total) by mouth every 8 (eight) hours as needed.   Iron (Ferrous Sulfate) 325 (65 Fe) MG Tabs Take 325 mg by mouth daily.   lisinopril 5 MG tablet Commonly known as: ZESTRIL Take 1 tablet (5 mg total) by mouth daily.   Omeprazole 20 MG Tbec Take 40 mg by mouth daily as needed (acid reflux).   oxyCODONE-acetaminophen 5-325 MG tablet Commonly known as: Percocet Take 1 tablet by mouth every 4 (four) hours as needed for severe pain.   simethicone 80 MG chewable tablet Commonly known as: Gas-X Chew 1 tablet (80 mg total) by mouth every 6 (six) hours as needed for flatulence.       Diet: routine diet  Activity: No lifting >10lbs, Pelvic rest for 6-8 weeks.   Outpatient follow up:2 weeks  02/27/2019 Jonelle Sidle, MD

## 2019-03-01 LAB — BPAM RBC
Blood Product Expiration Date: 202012292359
Blood Product Expiration Date: 202012292359
Unit Type and Rh: 5100
Unit Type and Rh: 5100

## 2019-03-01 LAB — TYPE AND SCREEN
ABO/RH(D): O POS
Antibody Screen: NEGATIVE
Unit division: 0
Unit division: 0

## 2019-03-03 ENCOUNTER — Encounter (HOSPITAL_BASED_OUTPATIENT_CLINIC_OR_DEPARTMENT_OTHER): Payer: Self-pay | Admitting: Obstetrics & Gynecology

## 2019-03-05 NOTE — Anesthesia Postprocedure Evaluation (Signed)
Anesthesia Post Note  Patient: Necole Buen  Procedure(s) Performed: XI ROBOTIC ASSISTED LAPAROSCOPIC HYSTERECTOMY AND SALPINGECTOMY (Bilateral ) CYSTOSCOPY (Bilateral Ureter)     Patient location during evaluation: PACU Anesthesia Type: General Level of consciousness: awake and alert Pain management: pain level controlled Vital Signs Assessment: post-procedure vital signs reviewed and stable Respiratory status: spontaneous breathing, nonlabored ventilation, respiratory function stable and patient connected to nasal cannula oxygen Cardiovascular status: blood pressure returned to baseline and stable Postop Assessment: no apparent nausea or vomiting Anesthetic complications: no    Last Vitals:  Vitals:   02/25/19 1646 02/25/19 1800  BP: 129/81 128/77  Pulse: 60 71  Resp: 18 16  Temp: 36.8 C 36.8 C  SpO2: 100% 100%    Last Pain:  Vitals:   03/03/19 0954  TempSrc:   PainSc: 5                  Windel Keziah

## 2019-04-21 ENCOUNTER — Telehealth: Payer: Self-pay | Admitting: Family Medicine

## 2019-04-21 NOTE — Telephone Encounter (Signed)
Patient's husband calling, states patient was advised at [facility] that her blood pressure medication needs to be upped. Patient's husband unaware of what BP was at visit.

## 2019-04-21 NOTE — Telephone Encounter (Signed)
FYI Pt has been scheduled for a virtual visit tomorrow 04/22/19

## 2019-04-22 ENCOUNTER — Telehealth (INDEPENDENT_AMBULATORY_CARE_PROVIDER_SITE_OTHER): Payer: 59 | Admitting: Family Medicine

## 2019-04-22 DIAGNOSIS — Z90711 Acquired absence of uterus with remaining cervical stump: Secondary | ICD-10-CM

## 2019-04-22 DIAGNOSIS — R635 Abnormal weight gain: Secondary | ICD-10-CM

## 2019-04-22 DIAGNOSIS — I10 Essential (primary) hypertension: Secondary | ICD-10-CM

## 2019-04-22 MED ORDER — LISINOPRIL 10 MG PO TABS: 10.0000 mg | ORAL_TABLET | Freq: Every day | ORAL | 4 refills | Status: DC

## 2019-04-22 MED ORDER — LISINOPRIL 10 MG PO TABS: 10.0000 mg | ORAL_TABLET | Freq: Every day | ORAL | 3 refills | Status: DC

## 2019-04-22 NOTE — Progress Notes (Signed)
Virtual Visit via Video Note  I connected with Erika Larson on 04/22/19 at  2:30 PM EST by a video enabled telemedicine application 2/2 [id number] pandemic and verified that I am speaking with the correct person using two identifiers.  Location patient: home Location provider:work or home office Persons participating in the virtual visit: patient, provider  I discussed the limitations of evaluation and management by telemedicine and the availability of in person appointments. The patient expressed understanding and agreed to proceed.   HPI: Pt is 49 yo female with pmh sig for arthritis, nicotine dependence, HTN.  Pt had a partial hysterectomy since last OFV.  States she was told her bp was elevated by OB/Gyn.  Pt taking lisinopril 5 mg.  Checking bp at home:160/90, 131/85 this afternoon.  Pt takes bp med in evening.  Denies headaches, chest pain, changes in vision.  pt eating salads and drinking more water.   Pt notes weight gain since surgery.  Pt plans to restart walking on Saturdays.  May have some throat irritation.  ROS: See pertinent positives and negatives per HPI.  Past Medical History:  Diagnosis Date  . Anemia   . Arthritis   . Gall bladder stones 1998  . Hypertension     Past Surgical History:  Procedure Laterality Date  . CHOLECYSTECTOMY    . CYSTOSCOPY Bilateral 02/25/2019   Procedure: CYSTOSCOPY;  Surgeon: Jonelle Sidle, MD;  Location: [facility];  Service: Gynecology;  Laterality: Bilateral;  . ROBOTIC ASSISTED LAPAROSCOPIC HYSTERECTOMY AND SALPINGECTOMY Bilateral 02/25/2019   Procedure: XI ROBOTIC ASSISTED LAPAROSCOPIC HYSTERECTOMY AND SALPINGECTOMY;  Surgeon: Jonelle Sidle, MD;  Location: [place];  Service: Gynecology;  Laterality: Bilateral;  . TUBAL LIGATION    . WISDOM TOOTH EXTRACTION      Family History  Problem Relation Age of Onset  . Arthritis Mother   . Cancer Mother   . Hearing loss Mother   .  Hypertension Mother   . Cancer Sister   . Cancer Brother      Current Outpatient Medications:  .  docusate sodium (COLACE) 100 MG capsule, Take 1 capsule (100 mg total) by mouth daily., Disp: 30 capsule, Rfl: 0 .  ibuprofen (ADVIL) 800 MG tablet, Take 1 tablet (800 mg total) by mouth every 8 (eight) hours as needed., Disp: 30 tablet, Rfl: 0 .  Iron, Ferrous Sulfate, 325 (65 Fe) MG TABS, Take 325 mg by mouth daily. , Disp: , Rfl:  .  lisinopril (ZESTRIL) 5 MG tablet, Take 1 tablet (5 mg total) by mouth daily., Disp: 30 tablet, Rfl: 4 .  Omeprazole 20 MG TBEC, Take 40 mg by mouth daily as needed (acid reflux). , Disp: , Rfl:  .  oxyCODONE-acetaminophen (PERCOCET) 5-325 MG tablet, Take 1 tablet by mouth every 4 (four) hours as needed for severe pain., Disp: 20 tablet, Rfl: 0 .  simethicone (GAS-X) 80 MG chewable tablet, Chew 1 tablet (80 mg total) by mouth every 6 (six) hours as needed for flatulence., Disp: 30 tablet, Rfl: 0  EXAM:  VITALS per patient if applicable:  RR between 12-20 bpm    GENERAL: alert, oriented, appears well and in no acute distress  HEENT: atraumatic, conjunctiva clear, no obvious abnormalities on inspection of external nose and ears  NECK: normal movements of the head and neck  LUNGS: on inspection no signs of respiratory distress, breathing rate appears normal, no obvious gross SOB, gasping or wheezing  CV: no obvious cyanosis  MS: moves  all visible extremities without noticeable abnormality  PSYCH/NEURO: pleasant and cooperative, no obvious depression or anxiety, speech and thought processing grossly intact  ASSESSMENT AND PLAN:  Discussed the following assessment and plan:  Essential hypertension  -improving -discussed increasing dose of lisinopril from 5 mg to 10 mg.  Pt advised to monitor for dry cough/throat irritation.  If noticed, can switch medications -continue lifestyle modifications -continue checking bp at home. - Plan: lisinopril (ZESTRIL)  10 MG tablet  S/P partial hysterectomy -healing  -continue f/u with OB/Gyn  Weight gain -Discussed lifestyle modifications -Discussed reading labels and portion control -We'll continue to monitor  F/u in 1 month  I discussed the assessment and treatment plan with the patient. The patient was provided an opportunity to ask questions and all were answered. The patient agreed with the plan and demonstrated an understanding of the instructions.   The patient was advised to call back or seek an in-person evaluation if the symptoms worsen or if the condition fails to improve as anticipated.   Billie Ruddy, MD

## 2019-06-10 ENCOUNTER — Telehealth: Payer: Self-pay | Admitting: Family Medicine

## 2019-06-10 NOTE — Telephone Encounter (Signed)
Pt's husband is calling to follow up on an appointment and a xray. Pt feels like a bone in her leg is chipped. Per pt's husband, he spoke to you and hasn't heard back regarding an appt and xray. Thanks

## 2019-06-10 NOTE — Telephone Encounter (Signed)
Spoke with pt husband state that wanted to make am appointment for his wife for an indentation on her foot that has been bothering her. Pt husband state that pt does not leave work until late afternoon, advised to check if pt is willing to come in the office for an office visit at 4.30 pm and then come back for a foot x-ray incase Dr Volanda Napoleon recommends one. State that he will speak to pt and then call the office back for scheduling.

## 2019-06-11 ENCOUNTER — Encounter: Payer: Self-pay | Admitting: Family Medicine

## 2019-06-11 ENCOUNTER — Other Ambulatory Visit: Payer: Self-pay

## 2019-06-11 ENCOUNTER — Ambulatory Visit (INDEPENDENT_AMBULATORY_CARE_PROVIDER_SITE_OTHER): Payer: 59 | Admitting: Family Medicine

## 2019-06-11 ENCOUNTER — Ambulatory Visit (INDEPENDENT_AMBULATORY_CARE_PROVIDER_SITE_OTHER): Payer: 59

## 2019-06-11 VITALS — BP 160/100 | HR 100 | Temp 97.1°F | Ht 67.0 in | Wt 219.0 lb

## 2019-06-11 DIAGNOSIS — M722 Plantar fascial fibromatosis: Secondary | ICD-10-CM

## 2019-06-11 DIAGNOSIS — M79671 Pain in right foot: Secondary | ICD-10-CM

## 2019-06-11 DIAGNOSIS — I10 Essential (primary) hypertension: Secondary | ICD-10-CM | POA: Diagnosis not present

## 2019-06-11 MED ORDER — MELOXICAM 15 MG PO TABS: 15.0000 mg | ORAL_TABLET | Freq: Every day | ORAL | 0 refills | Status: DC

## 2019-06-11 NOTE — Progress Notes (Signed)
Subjective:    Patient ID: Erika Larson, female    DOB: 1970-12-10, 49 y.o.   MRN: [id number]  Chief Complaint  Patient presents with  . Foot Pain    Pt present for R foot pain.     HPI Pt is a 49 yo female with pmh sig for HTN, Arthritis, nicotine use, s/p hysterectomy, h/o R plantar fasciitis seen today for ongoing concern.  Pt endoreses R foot pain x 1-2 wks.  Has an area of edema on the top of foot.  Pt able to bear weight, but endorses pain with walking.  Pt does not recall injury. Has not tried anything for the pain.  Pt endorses standing during the day, works at a school [employer].    Past Medical History:  Diagnosis Date  . Anemia   . Arthritis   . Gall bladder stones 1998  . Hypertension     Allergies  Allergen Reactions  . Shellfish Allergy Anaphylaxis and Swelling    ROS General: Denies fever, chills, night sweats, changes in weight, changes in appetite HEENT: Denies headaches, ear pain, changes in vision, rhinorrhea, sore throat CV: Denies CP, palpitations, SOB, orthopnea Pulm: Denies SOB, cough, wheezing GI: Denies abdominal pain, nausea, vomiting, diarrhea, constipation GU: Denies dysuria, hematuria, frequency, vaginal discharge Msk: Denies muscle cramps, joint pains  +R foot pain Neuro: Denies weakness, numbness, tingling Skin: Denies rashes, bruising  +edma of top of R foot Psych: Denies depression, anxiety, hallucinations     Objective:    Blood pressure (!) 160/100, pulse 100, temperature (!) 97.1 F (36.2 C), temperature source Other (Comment), height 5\' 7"  (1.702 m), weight 219 lb (99.3 kg), SpO2 98 %.   Gen. Pleasant, well-nourished, in no distress, normal affect  HEENT: [place]/AT, face symmetric, no scleral icterus, PERRLA, EOMI, nares patent without drainag Lungs: no accessory muscle use Cardiovascular: RRR, no peripheral edema.  DP and PT pulses intact b/l.  Musculoskeletal: 3 cm round area of soft tissue edema of R lateral midfoot and  proximial base of 4/5 with TTP.  Able to bear weight on R foot, dorsiflexion and plantar flexion without pain. Arches intact.  No deformities, no cyanosis or clubbing, normal tone Neuro:  A&Ox3, CN II-XII intact, ambulating with a limp. Skin:  Warm, no lesions/ rash  Wt Readings from Last 3 Encounters:  06/11/19 219 lb (99.3 kg)  02/25/19 216 lb 9 oz (98.2 kg)  02/23/19 217 lb 1 oz (98.5 kg)    Lab Results  Component Value Date   WBC 5.6 02/23/2019   HGB 8.1 (L) 02/23/2019   HCT 29.2 (L) 02/23/2019   PLT 213 02/23/2019   GLUCOSE 100 (H) 02/23/2019   ALT 17 09/27/2016   AST 19 09/27/2016   NA 139 02/23/2019   K 3.5 02/23/2019   CL 110 02/23/2019   CREATININE 0.82 02/23/2019   BUN 12 02/23/2019   CO2 21 (L) 02/23/2019   INR 0.99 08/06/2011    Assessment/Plan:  Right foot pain  -discussed possible causes including stress fx, arthritis, plantar fasciitis, gout -Mobic for pain -discussed post op walking shoe -will determine further treatment based on imaging - Plan: DG Foot Complete Right, meloxicam (MOBIC) 15 MG tablet  Plantar fasciitis, right -discussed stretching, NSAIDs, ice -given handouts -consider PT  Essential hypertension -elevated likely 2/2 pain -will recheck -pt to check bp at home -continue lisinopril 10 mg.  For continued elevation will increase dose to 20 mg. -given precautions  F/u prn  Grier Mitts, MD

## 2019-06-11 NOTE — Patient Instructions (Addendum)
Foot Pain Many things can cause foot pain. Some common causes are:  An injury.  A sprain.  Arthritis.  Blisters.  Bunions. Follow these instructions at home: Managing pain, stiffness, and swelling If directed, put ice on the painful area:  Put ice in a plastic bag.  Place a towel between your skin and the bag.  Leave the ice on for 20 minutes, 2-3 times a day.  Activity  Do not stand or walk for long periods.  Return to your normal activities as told by your health care provider. Ask your health care provider what activities are safe for you.  Do stretches to relieve foot pain and stiffness as told by your health care provider.  Do not lift anything that is heavier than 10 lb (4.5 kg), or the limit that you are told, until your health care provider says that it is safe. Lifting a lot of weight can put added pressure on your feet. Lifestyle  Wear comfortable, supportive shoes that fit you well. Do not wear high heels.  Keep your feet clean and dry. General instructions  Take over-the-counter and prescription medicines only as told by your health care provider.  Rub your foot gently.  Pay attention to any changes in your symptoms.  Keep all follow-up visits as told by your health care provider. This is important. Contact a health care provider if:  Your pain does not get better after a few days of self-care.  Your pain gets worse.  You cannot stand on your foot. Get help right away if:  Your foot is numb or tingling.  Your foot or toes are swollen.  Your foot or toes turn white or blue.  You have warmth and redness along your foot. Summary  Common causes of foot pain are injury, sprain, arthritis, blisters or bunions.  Ice, medicines, and comfortable shoes may help foot pain.  Contact your health care provider if your pain does not get better after a few days of self-care. This information is not intended to replace advice given to you by your  health care provider. Make sure you discuss any questions you have with your health care provider. Document Revised: 01/02/2018 Document Reviewed: 01/02/2018 Elsevier Patient Education  [place].  Plantar Fasciitis  Plantar fasciitis is a painful foot condition that affects the heel. It occurs when the band of tissue that connects the toes to the heel bone (plantar fascia) becomes irritated. This can happen as the result of exercising too much or doing other repetitive activities (overuse injury). The pain from plantar fasciitis can range from mild irritation to severe pain that makes it difficult to walk or move. The pain is usually worse in the morning after sleeping, or after sitting or lying down for a while. Pain may also be worse after long periods of walking or standing. What are the causes? This condition may be caused by:  Standing for long periods of time.  Wearing shoes that do not have good arch support.  Doing activities that put stress on joints (high-impact activities), including running, aerobics, and ballet.  Being overweight.  An abnormal way of walking (gait).  Tight muscles in the back of your lower leg (calf).  High arches in your feet.  Starting a new athletic activity. What are the signs or symptoms? The main symptom of this condition is heel pain. Pain may:  Be worse with first steps after a time of rest, especially in the morning after sleeping or after you  have been sitting or lying down for a while.  Be worse after long periods of standing still.  Decrease after 30-45 minutes of activity, such as gentle walking. How is this diagnosed? This condition may be diagnosed based on your medical history and your symptoms. Your health care provider may ask questions about your activity level. Your health care provider will do a physical exam to check for:  A tender area on the bottom of your foot.  A high arch in your foot.  Pain when you move your  foot.  Difficulty moving your foot. You may have imaging tests to confirm the diagnosis, such as:  X-rays.  Ultrasound.  MRI. How is this treated? Treatment for plantar fasciitis depends on how severe your condition is. Treatment may include:  Rest, ice, applying pressure (compression), and raising the affected foot (elevation). This may be called RICE therapy. Your health care provider may recommend RICE therapy along with over-the-counter pain medicines to manage your pain.  Exercises to stretch your calves and your plantar fascia.  A splint that holds your foot in a stretched, upward position while you sleep (night splint).  Physical therapy to relieve symptoms and prevent problems in the future.  Injections of steroid medicine (cortisone) to relieve pain and inflammation.  Stimulating your plantar fascia with electrical impulses (extracorporeal shock wave therapy). This is usually the last treatment option before surgery.  Surgery, if other treatments have not worked after 12 months. Follow these instructions at home:  Managing pain, stiffness, and swelling  If directed, put ice on the painful area: ? Put ice in a plastic bag, or use a frozen bottle of water. ? Place a towel between your skin and the bag or bottle. ? Roll the bottom of your foot over the bag or bottle. ? Do this for 20 minutes, 2-3 times a day.  Wear athletic shoes that have air-sole or gel-sole cushions, or try wearing soft shoe inserts that are designed for plantar fasciitis.  Raise (elevate) your foot above the level of your heart while you are sitting or lying down. Activity  Avoid activities that cause pain. Ask your health care provider what activities are safe for you.  Do physical therapy exercises and stretches as told by your health care provider.  Try activities and forms of exercise that are easier on your joints (low-impact). Examples include swimming, water aerobics, and biking. General  instructions  Take over-the-counter and prescription medicines only as told by your health care provider.  Wear a night splint while sleeping, if told by your health care provider. Loosen the splint if your toes tingle, become numb, or turn cold and blue.  Maintain a healthy weight, or work with your health care provider to lose weight as needed.  Keep all follow-up visits as told by your health care provider. This is important. Contact a health care provider if you:  Have symptoms that do not go away after caring for yourself at home.  Have pain that gets worse.  Have pain that affects your ability to move or do your daily activities. Summary  Plantar fasciitis is a painful foot condition that affects the heel. It occurs when the band of tissue that connects the toes to the heel bone (plantar fascia) becomes irritated.  The main symptom of this condition is heel pain that may be worse after exercising too much or standing still for a long time.  Treatment varies, but it usually starts with rest, ice, compression, and elevation (  RICE therapy) and over-the-counter medicines to manage pain. This information is not intended to replace advice given to you by your health care provider. Make sure you discuss any questions you have with your health care provider. Document Revised: 03/01/2017 Document Reviewed: 01/14/2017 Elsevier Patient Education  [place].  Plantar Fasciitis Rehab Ask your health care provider which exercises are safe for you. Do exercises exactly as told by your health care provider and adjust them as directed. It is normal to feel mild stretching, pulling, tightness, or discomfort as you do these exercises. Stop right away if you feel sudden pain or your pain gets worse. Do not begin these exercises until told by your health care provider. Stretching and range-of-motion exercises These exercises warm up your muscles and joints and improve the movement and  flexibility of your foot. These exercises also help to relieve pain. Plantar fascia stretch  1. Sit with your left / right leg crossed over your opposite knee. 2. Hold your heel with one hand with that thumb near your arch. With your other hand, hold your toes and gently pull them back toward the top of your foot. You should feel a stretch on the bottom of your toes or your foot (plantar fascia) or both. 3. Hold this stretch for__________ seconds. 4. Slowly release your toes and return to the starting position. Repeat __________ times. Complete this exercise __________ times a day. Gastrocnemius stretch, standing This exercise is also called a calf (gastroc) stretch. It stretches the muscles in the back of the upper calf. 1. Stand with your hands against a wall. 2. Extend your left / right leg behind you, and bend your front knee slightly. 3. Keeping your heels on the floor and your back knee straight, shift your weight toward the wall. Do not arch your back. You should feel a gentle stretch in your upper left / right calf. 4. Hold this position for __________ seconds. Repeat __________ times. Complete this exercise __________ times a day. Soleus stretch, standing This exercise is also called a calf (soleus) stretch. It stretches the muscles in the back of the lower calf. 1. Stand with your hands against a wall. 2. Extend your left / right leg behind you, and bend your front knee slightly. 3. Keeping your heels on the floor, bend your back knee and shift your weight slightly over your back leg. You should feel a gentle stretch deep in your lower calf. 4. Hold this position for __________ seconds. Repeat __________ times. Complete this exercise __________ times a day. Gastroc and soleus stretch, standing step This exercise stretches the muscles in the back of the lower leg. These muscles are in the upper calf (gastrocnemius) and the lower calf (soleus). 1. Stand with the ball of your left /  right foot on a step. The ball of your foot is on the walking surface, right under your toes. 2. Keep your other foot firmly on the same step. 3. Hold on to the wall or a railing for balance. 4. Slowly lift your other foot, allowing your body weight to press your left / right heel down over the edge of the step. You should feel a stretch in your left / right calf. 5. Hold this position for __________ seconds. 6. Return both feet to the step. 7. Repeat this exercise with a slight bend in your left / right knee. Repeat __________ times with your left / right knee straight and __________ times with your left / right knee bent.  Complete this exercise __________ times a day. Balance exercise This exercise builds your balance and strength control of your arch to help take pressure off your plantar fascia. Single leg stand If this exercise is too easy, you can try it with your eyes closed or while standing on a pillow. 1. Without shoes, stand near a railing or in a doorway. You may hold on to the railing or door frame as needed. 2. Stand on your left / right foot. Keep your big toe down on the floor and try to keep your arch lifted. Do not let your foot roll inward. 3. Hold this position for __________ seconds. Repeat __________ times. Complete this exercise __________ times a day. This information is not intended to replace advice given to you by your health care provider. Make sure you discuss any questions you have with your health care provider. Document Revised: 07/10/2018 Document Reviewed: 01/15/2018 Elsevier Patient Education  [place].

## 2019-06-12 ENCOUNTER — Encounter: Payer: Self-pay | Admitting: Family Medicine

## 2019-06-15 ENCOUNTER — Other Ambulatory Visit: Payer: Self-pay

## 2019-06-15 ENCOUNTER — Telehealth: Payer: Self-pay | Admitting: *Deleted

## 2019-06-15 DIAGNOSIS — M79671 Pain in right foot: Secondary | ICD-10-CM

## 2019-06-15 NOTE — Telephone Encounter (Signed)
Spoke with pt husband listed on pt DPR, reviewed pt imaging results and recommendations, state that pt request to have a referral to sports medicine, referral placed and pt is aware to wait for a scheduling call from sport medicine.

## 2019-06-15 NOTE — Telephone Encounter (Signed)
Patient husband called after hours line. Patient husband was told his wife was will be starting treatment. Husband wanted to know what this treatment was and what it was for. Husband is on the [facility]

## 2019-06-16 ENCOUNTER — Other Ambulatory Visit: Payer: Self-pay

## 2019-06-16 ENCOUNTER — Encounter: Payer: Self-pay | Admitting: Orthopedic Surgery

## 2019-06-16 ENCOUNTER — Ambulatory Visit: Payer: 59 | Admitting: Orthopedic Surgery

## 2019-06-16 DIAGNOSIS — S92344A Nondisplaced fracture of fourth metatarsal bone, right foot, initial encounter for closed fracture: Secondary | ICD-10-CM

## 2019-06-16 NOTE — Progress Notes (Signed)
Office Visit Note   Patient: Erika Larson           Date of Birth: [birth year]           MRN: [id number] Visit Date: 06/16/2019              Requested by: Billie Ruddy, MD [place],  [place] [id number] PCP: Billie Ruddy, MD  Chief Complaint  Patient presents with  . Right Foot - Pain      HPI: The patient is a 49 year old woman seen today for initial evaluation of right foot pain. Has been ongoing for a little over a week. Does work standing and walking on feet for prolonged periods of time. No injury she can recall. At onset of pain felt she might have just had a long day at work. Pain to lateral midfoot with weight bearing. Complains of associated swelling and bruising. Waxing and waning aching/stabbing pain. Has had no relief with tylenol. Started meloxicam on Saturday.  Assessment & Plan: Visit Diagnoses: No diagnosis found.  Plan: placed in post op shoe. Weight bear as tolerated in shoe. Continue meloxicam. Follow up in 2 weeks. Out of work for 2 weeks.  Follow-Up Instructions: No follow-ups on file.   Right Ankle Exam   Tenderness  The patient is experiencing no tenderness. Swelling: mild  Range of Motion  The patient has normal right ankle ROM.  Muscle Strength  The patient has normal right ankle strength.  Tests  Anterior drawer: negative Varus tilt: negative  Comments:  TTP over base of 4th MT and 5th dorsally.      Patient is alert, oriented, no adenopathy, well-dressed, normal affect, normal respiratory effort.   Imaging: No results found. No images are attached to the encounter.  Labs: No results found for: HGBA1C, ESRSEDRATE, CRP, LABURIC, REPTSTATUS, GRAMSTAIN, CULT, LABORGA   Lab Results  Component Value Date   ALBUMIN 4.0 09/27/2016   ALBUMIN 3.8 07/07/2014    No results found for: MG No results found for: VD25OH  No results found for: PREALBUMIN CBC EXTENDED Latest Ref Rng & Units 02/23/2019  09/27/2016 05/25/2016  WBC 4.0 - 10.5 K/uL 5.6 10.0 7.4  RBC 3.87 - 5.11 MIL/uL 3.87 4.34 4.02  HGB 12.0 - 15.0 g/dL 8.1(L) 12.1 7.5(L)  HCT 36.0 - 46.0 % 29.2(L) 38.6 26.6(L)  PLT 150 - 400 K/uL 213 279 247  NEUTROABS 1.7 - 7.7 K/uL - - 5.3  LYMPHSABS 0.7 - 4.0 K/uL - - 1.7     There is no height or weight on file to calculate BMI.  Orders:  No orders of the defined types were placed in this encounter.  No orders of the defined types were placed in this encounter.    Procedures: No procedures performed  Clinical Data: No additional findings.  ROS:  All other systems negative, except as noted in the HPI. Review of Systems  Constitutional: Negative for chills and fever.  Musculoskeletal: Positive for arthralgias and gait problem.    Objective: Vital Signs: There were no vitals taken for this visit.  Specialty Comments:  No specialty comments available.  PMFS History: Patient Active Problem List   Diagnosis Date Noted  . Status post hysterectomy 02/25/2019  . Essential hypertension 04/27/2018  . Arthritis 04/27/2018  . Cigarette nicotine dependence without complication [id number]   Past Medical History:  Diagnosis Date  . Anemia   . Arthritis   . Gall bladder stones 1998  .  Hypertension     Family History  Problem Relation Age of Onset  . Arthritis Mother   . Cancer Mother   . Hearing loss Mother   . Hypertension Mother   . Cancer Sister   . Cancer Brother     Past Surgical History:  Procedure Laterality Date  . CHOLECYSTECTOMY    . CYSTOSCOPY Bilateral 02/25/2019   Procedure: CYSTOSCOPY;  Surgeon: Jonelle Sidle, MD;  Location: [facility];  Service: Gynecology;  Laterality: Bilateral;  . ROBOTIC ASSISTED LAPAROSCOPIC HYSTERECTOMY AND SALPINGECTOMY Bilateral 02/25/2019   Procedure: XI ROBOTIC ASSISTED LAPAROSCOPIC HYSTERECTOMY AND SALPINGECTOMY;  Surgeon: Jonelle Sidle, MD;  Location: [facility];   Service: Gynecology;  Laterality: Bilateral;  . TUBAL LIGATION    . WISDOM TOOTH EXTRACTION     Social History   Occupational History  . Not on file  Tobacco Use  . Smoking status: Former Smoker    Packs/day: 0.25    Years: 2.00    Pack years: 0.50    Quit date: 02/02/2019    Years since quitting: 0.3  . Smokeless tobacco: Never Used  Substance and Sexual Activity  . Alcohol use: Never  . Drug use: No  . Sexual activity: Yes    Birth control/protection: None, I.U.D.

## 2019-06-17 NOTE — Telephone Encounter (Signed)
Spoke with pt  husband verbalized understanding the pt referral to Orthopedics was placed and that pt should receive a call to schedule.

## 2019-07-01 ENCOUNTER — Other Ambulatory Visit: Payer: Self-pay

## 2019-07-01 ENCOUNTER — Encounter: Payer: Self-pay | Admitting: Physician Assistant

## 2019-07-01 ENCOUNTER — Ambulatory Visit: Payer: 59 | Admitting: Physician Assistant

## 2019-07-01 VITALS — Ht 67.0 in | Wt 219.0 lb

## 2019-07-01 DIAGNOSIS — M25571 Pain in right ankle and joints of right foot: Secondary | ICD-10-CM

## 2019-07-01 MED ORDER — LIDOCAINE HCL 1 % IJ SOLN
2.0000 mL | INTRAMUSCULAR | Status: AC | PRN
  Administered 2019-07-01: 2 mL

## 2019-07-01 MED ORDER — METHYLPREDNISOLONE ACETATE 40 MG/ML IJ SUSP
40.0000 mg | INTRAMUSCULAR | Status: AC | PRN
  Administered 2019-07-01: 40 mg via INTRA_ARTICULAR

## 2019-07-01 NOTE — Progress Notes (Signed)
Office Visit Note   Patient: Erika Larson           Date of Birth: 01/30/1971           MRN: [id number] Visit Date: 07/01/2019              Requested by: Billie Ruddy, MD [address] [place],  [place] [address] PCP: Billie Ruddy, MD  Chief Complaint  Patient presents with  . Right Foot - Pain      HPI: This is a pleasant 49 year old woman who is here to follow-up on her right foot pain.  She has been using a postop shoe.  She feels it has been very helpful but she did add an ankle brace and it is better.  She focuses her pain today to be on the ankle she also has some mild soft tissue swelling from time to time she thinks standing on cement like she does at work makes it worse she uses ice and this seems to help  Assessment & Plan: Visit Diagnoses: No diagnosis found.  Plan: She will follow up in 3 weeks.  If she still does not have good improvement of her symptoms I would recommend an MRI most likely of the ankle  Follow-Up Instructions: No follow-ups on file.   Ortho Exam  Patient is alert, oriented, no adenopathy, well-dressed, normal affect, normal respiratory effort. She is tender to palpation around the ankle joint today.  No cellulitis mild soft tissue swelling no warmth no particular tenderness of the foot.  I discussed with her either an injection into her ankle to diagnose where the pain is coming from as well does offer her some relief she would like to go forward with this today Imaging: No results found. No images are attached to the encounter.  Labs: No results found for: HGBA1C, ESRSEDRATE, CRP, LABURIC, REPTSTATUS, GRAMSTAIN, CULT, LABORGA   Lab Results  Component Value Date   ALBUMIN 4.0 09/27/2016   ALBUMIN 3.8 07/07/2014    No results found for: MG No results found for: VD25OH  No results found for: PREALBUMIN CBC EXTENDED Latest Ref Rng & Units 02/23/2019 09/27/2016 05/25/2016  WBC 4.0 - 10.5 K/uL 5.6 10.0 7.4  RBC  3.87 - 5.11 MIL/uL 3.87 4.34 4.02  HGB 12.0 - 15.0 g/dL 8.1(L) 12.1 7.5(L)  HCT 36.0 - 46.0 % 29.2(L) 38.6 26.6(L)  PLT 150 - 400 K/uL 213 279 247  NEUTROABS 1.7 - 7.7 K/uL - - 5.3  LYMPHSABS 0.7 - 4.0 K/uL - - 1.7     Body mass index is 34.3 kg/m.  Orders:  No orders of the defined types were placed in this encounter.  No orders of the defined types were placed in this encounter.    Procedures: Medium Joint Inj on 07/01/2019 3:24 PM Indications: pain and diagnostic evaluation Details: 22 G 1.5 in needle, anteromedial approach Medications: 2 mL lidocaine 1 %; 40 mg methylPREDNISolone acetate 40 MG/ML Outcome: tolerated well, no immediate complications Procedure, treatment alternatives, risks and benefits explained, specific risks discussed. Consent was given by the patient.      Clinical Data: No additional findings.  ROS:  All other systems negative, except as noted in the HPI. Review of Systems  Objective: Vital Signs: Ht 5\' 7"  (1.702 m)   Wt 219 lb (99.3 kg)   BMI 34.30 kg/m   Specialty Comments:  No specialty comments available.  PMFS History: Patient Active Problem List   Diagnosis Date Noted  .  Status post hysterectomy 02/25/2019  . Essential hypertension 04/27/2018  . Arthritis 04/27/2018  . Cigarette nicotine dependence without complication [id number]   Past Medical History:  Diagnosis Date  . Anemia   . Arthritis   . Gall bladder stones 1998  . Hypertension     Family History  Problem Relation Age of Onset  . Arthritis Mother   . Cancer Mother   . Hearing loss Mother   . Hypertension Mother   . Cancer Sister   . Cancer Brother     Past Surgical History:  Procedure Laterality Date  . CHOLECYSTECTOMY    . CYSTOSCOPY Bilateral 02/25/2019   Procedure: CYSTOSCOPY;  Surgeon: Jonelle Sidle, MD;  Location: [facility];  Service: Gynecology;  Laterality: Bilateral;  . ROBOTIC ASSISTED LAPAROSCOPIC HYSTERECTOMY AND  SALPINGECTOMY Bilateral 02/25/2019   Procedure: XI ROBOTIC ASSISTED LAPAROSCOPIC HYSTERECTOMY AND SALPINGECTOMY;  Surgeon: Jonelle Sidle, MD;  Location: [place];  Service: Gynecology;  Laterality: Bilateral;  . TUBAL LIGATION    . WISDOM TOOTH EXTRACTION     Social History   Occupational History  . Not on file  Tobacco Use  . Smoking status: Former Smoker    Packs/day: 0.25    Years: 2.00    Pack years: 0.50    Quit date: 02/02/2019    Years since quitting: 0.4  . Smokeless tobacco: Never Used  Substance and Sexual Activity  . Alcohol use: Never  . Drug use: No  . Sexual activity: Yes    Birth control/protection: None, I.U.D.

## 2019-07-02 ENCOUNTER — Telehealth: Payer: Self-pay | Admitting: Physician Assistant

## 2019-07-02 ENCOUNTER — Ambulatory Visit: Payer: 59 | Admitting: Orthopedic Surgery

## 2019-07-02 NOTE — Telephone Encounter (Signed)
Patient's husband called. Asked if they brought a form by with [place] letter head on it that Erika Larson could write a note saying that she was here for her appointment. I explained that the letter she got with our letter head on it with her appointment date and time should be good.

## 2019-07-07 ENCOUNTER — Other Ambulatory Visit: Payer: Self-pay | Admitting: Family Medicine

## 2019-07-07 DIAGNOSIS — M79671 Pain in right foot: Secondary | ICD-10-CM

## 2019-07-27 ENCOUNTER — Telehealth: Payer: Self-pay | Admitting: Orthopedic Surgery

## 2019-07-27 ENCOUNTER — Encounter: Payer: Self-pay | Admitting: Physician Assistant

## 2019-07-27 ENCOUNTER — Other Ambulatory Visit: Payer: Self-pay

## 2019-07-27 ENCOUNTER — Ambulatory Visit: Payer: 59 | Admitting: Orthopedic Surgery

## 2019-07-27 VITALS — Ht 67.0 in | Wt 219.0 lb

## 2019-07-27 DIAGNOSIS — M25871 Other specified joint disorders, right ankle and foot: Secondary | ICD-10-CM

## 2019-07-27 MED ORDER — LIDOCAINE HCL 1 % IJ SOLN
2.0000 mL | INTRAMUSCULAR | Status: AC | PRN
  Administered 2019-07-27: 2 mL

## 2019-07-27 MED ORDER — METHYLPREDNISOLONE ACETATE 40 MG/ML IJ SUSP
40.0000 mg | INTRAMUSCULAR | Status: AC | PRN
  Administered 2019-07-27: 40 mg via INTRA_ARTICULAR

## 2019-07-27 NOTE — Telephone Encounter (Signed)
[place] patient °

## 2019-07-27 NOTE — Telephone Encounter (Signed)
Patient called. She needs a return to work note. Says Dr. Sharol Given told her she can go to work on Wednesday of this week.  Her call back number is [phone]

## 2019-07-27 NOTE — Progress Notes (Signed)
Office Visit Note   Patient: Erika Larson           Date of Birth: 1970-06-17           MRN: [id number] Visit Date: 07/27/2019              Requested by: Billie Ruddy, MD [place],  [place] [address] PCP: Billie Ruddy, MD  Chief Complaint  Patient presents with  . Right Foot - Follow-up      HPI: Patient is a 49 year old woman who was seen for evaluation for anterior medial right ankle pain.  Patient states she did have an injection previously which provided her relief through the day but the pain reoccurred at night.  Patient complains of pain with standing occasional swelling.  Patient states she was told she had to bone fractures in her foot.  Assessment & Plan: Visit Diagnoses:  1. Impingement of left ankle joint     Plan: The right ankle was injected from the anterior medial portal recommended she continue using her ASO reevaluate in 4 weeks.  Discussed that she may need to proceed with an MRI scan and possible arthroscopy of the ankle for the impingement symptoms.  Patient states she is post return to work on the 28th.  Discussed that if she is too symptomatic to return to work we will complete paperwork for her when she returns to the office.  Follow-Up Instructions: Return in about 4 weeks (around 08/24/2019).   Ortho Exam  Patient is alert, oriented, no adenopathy, well-dressed, normal affect, normal respiratory effort. Examination she has a good pulse.  Review of her radiographs shows no evidence of a fracture in the foot.  Patient is point tender to palpation over the anterior medial joint line.  She can go up on her toes without problems she has a high arch no posterior tibial tendon insufficiency.  The posterior tibial tendon is minimally tender to palpation.  Imaging: No results found. No images are attached to the encounter.  Labs: No results found for: HGBA1C, ESRSEDRATE, CRP, LABURIC, REPTSTATUS, GRAMSTAIN, CULT,  LABORGA   Lab Results  Component Value Date   ALBUMIN 4.0 09/27/2016   ALBUMIN 3.8 07/07/2014    No results found for: MG No results found for: VD25OH  No results found for: PREALBUMIN CBC EXTENDED Latest Ref Rng & Units 02/23/2019 09/27/2016 05/25/2016  WBC 4.0 - 10.5 K/uL 5.6 10.0 7.4  RBC 3.87 - 5.11 MIL/uL 3.87 4.34 4.02  HGB 12.0 - 15.0 g/dL 8.1(L) 12.1 7.5(L)  HCT 36.0 - 46.0 % 29.2(L) 38.6 26.6(L)  PLT 150 - 400 K/uL 213 279 247  NEUTROABS 1.7 - 7.7 K/uL - - 5.3  LYMPHSABS 0.7 - 4.0 K/uL - - 1.7     Body mass index is 34.3 kg/m.  Orders:  No orders of the defined types were placed in this encounter.  No orders of the defined types were placed in this encounter.    Procedures: Medium Joint Inj: R ankle on 07/27/2019 10:14 AM Indications: pain and diagnostic evaluation Details: 22 G 1.5 in needle, anteromedial approach Medications: 2 mL lidocaine 1 %; 40 mg methylPREDNISolone acetate 40 MG/ML Outcome: tolerated well, no immediate complications Procedure, treatment alternatives, risks and benefits explained, specific risks discussed. Consent was given by the patient. Immediately prior to procedure a time out was called to verify the correct patient, procedure, equipment, support staff and site/side marked as required. Patient was prepped and draped in the  usual sterile fashion.      Clinical Data: No additional findings.  ROS:  All other systems negative, except as noted in the HPI. Review of Systems  Objective: Vital Signs: Ht 5\' 7"  (1.702 m)   Wt 219 lb (99.3 kg)   BMI 34.30 kg/m   Specialty Comments:  No specialty comments available.  PMFS History: Patient Active Problem List   Diagnosis Date Noted  . Status post hysterectomy 02/25/2019  . Essential hypertension 04/27/2018  . Arthritis 04/27/2018  . Cigarette nicotine dependence without complication [id number]   Past Medical History:  Diagnosis Date  . Anemia   . Arthritis   . Gall bladder  stones 1998  . Hypertension     Family History  Problem Relation Age of Onset  . Arthritis Mother   . Cancer Mother   . Hearing loss Mother   . Hypertension Mother   . Cancer Sister   . Cancer Brother     Past Surgical History:  Procedure Laterality Date  . CHOLECYSTECTOMY    . CYSTOSCOPY Bilateral 02/25/2019   Procedure: CYSTOSCOPY;  Surgeon: Jonelle Sidle, MD;  Location: [facility];  Service: Gynecology;  Laterality: Bilateral;  . ROBOTIC ASSISTED LAPAROSCOPIC HYSTERECTOMY AND SALPINGECTOMY Bilateral 02/25/2019   Procedure: XI ROBOTIC ASSISTED LAPAROSCOPIC HYSTERECTOMY AND SALPINGECTOMY;  Surgeon: Jonelle Sidle, MD;  Location: [place];  Service: Gynecology;  Laterality: Bilateral;  . TUBAL LIGATION    . WISDOM TOOTH EXTRACTION     Social History   Occupational History  . Not on file  Tobacco Use  . Smoking status: Former Smoker    Packs/day: 0.25    Years: 2.00    Pack years: 0.50    Quit date: 02/02/2019    Years since quitting: 0.4  . Smokeless tobacco: Never Used  Substance and Sexual Activity  . Alcohol use: Never  . Drug use: No  . Sexual activity: Yes    Birth control/protection: None, I.U.D.

## 2019-07-28 ENCOUNTER — Other Ambulatory Visit: Payer: Self-pay

## 2019-07-28 ENCOUNTER — Telehealth: Payer: Self-pay | Admitting: Orthopedic Surgery

## 2019-07-28 NOTE — Telephone Encounter (Signed)
I called and sw pt's husband and advised that the note was written for her to return to work as tolerated tomorrow as requested. He states that she was not sure what the plan was and if she would be able to tolerate walking on concrete floors. Advised that she should give the cortisone injection a few days to see if this works and if return to work is too painful she can let us know and we can complete her FMLA paperwork and proceed with MRI to further eval symptoms. Voiced understanding and will call with questions.

## 2019-07-28 NOTE — Telephone Encounter (Signed)
This has been done.

## 2019-07-28 NOTE — Telephone Encounter (Signed)
Patient's husband called.   He is requesting a call back to discuss how the patient should go about returning to work or whether work is an option right now.   Call back: [phone]

## 2019-07-29 ENCOUNTER — Other Ambulatory Visit: Payer: Self-pay

## 2019-07-29 ENCOUNTER — Telehealth: Payer: Self-pay | Admitting: Orthopedic Surgery

## 2019-07-29 ENCOUNTER — Other Ambulatory Visit: Payer: Self-pay | Admitting: Orthopedic Surgery

## 2019-07-29 DIAGNOSIS — M25871 Other specified joint disorders, right ankle and foot: Secondary | ICD-10-CM

## 2019-07-29 NOTE — Telephone Encounter (Signed)
I called and sw pt to advise that the letter has been written to excuse from work till next office visit until 08/24/19 pt states that she is unable to work because she can not stand on her right foot. She is s/p cortisone injection Monday says that it has bot been helpful. Do you want to proceed with MRI as discussed?

## 2019-07-29 NOTE — Telephone Encounter (Signed)
I ordered mri of right ankle, have her follow up after mri

## 2019-07-29 NOTE — Telephone Encounter (Signed)
Patient's husband called again.   The note they requested last phone call was not sufficient enough for the patient's job. They said she must either be fully returned to work or completley taken out of work. They are now requesting a note taking her completely out of work.    Call back [phone]

## 2019-08-05 ENCOUNTER — Other Ambulatory Visit: Payer: Self-pay | Admitting: Family Medicine

## 2019-08-05 ENCOUNTER — Telehealth: Payer: Self-pay | Admitting: Orthopedic Surgery

## 2019-08-05 DIAGNOSIS — M79671 Pain in right foot: Secondary | ICD-10-CM

## 2019-08-05 NOTE — Telephone Encounter (Signed)
Patient called requesting New return to work doctor's note. MRI has been changed and MRI review is not until September 07, 2019. Please revise work not dates to return to work after appointment with Dr. Sharol Given. Please call patient at [phone].

## 2019-08-06 ENCOUNTER — Other Ambulatory Visit: Payer: Self-pay

## 2019-08-06 NOTE — Telephone Encounter (Signed)
Patient walked into office today for letter. Written and given to pt.

## 2019-08-24 ENCOUNTER — Ambulatory Visit: Payer: 59 | Admitting: Orthopedic Surgery

## 2019-09-02 ENCOUNTER — Ambulatory Visit
Admission: RE | Admit: 2019-09-02 | Discharge: 2019-09-02 | Disposition: A | Discharge status: Home / Self Care | Payer: 59 | Source: Ambulatory Visit | Attending: Orthopedic Surgery | Admitting: Orthopedic Surgery

## 2019-09-02 ENCOUNTER — Other Ambulatory Visit: Payer: Self-pay

## 2019-09-02 DIAGNOSIS — M25871 Other specified joint disorders, right ankle and foot: Secondary | ICD-10-CM

## 2019-09-06 ENCOUNTER — Other Ambulatory Visit: Payer: Self-pay | Admitting: Family Medicine

## 2019-09-06 DIAGNOSIS — M79671 Pain in right foot: Secondary | ICD-10-CM

## 2019-09-07 ENCOUNTER — Encounter: Payer: Self-pay | Admitting: Orthopedic Surgery

## 2019-09-07 ENCOUNTER — Ambulatory Visit: Payer: 59 | Admitting: Orthopedic Surgery

## 2019-09-07 ENCOUNTER — Other Ambulatory Visit: Payer: Self-pay

## 2019-09-07 VITALS — Ht 67.0 in | Wt 219.0 lb

## 2019-09-07 DIAGNOSIS — M25871 Other specified joint disorders, right ankle and foot: Secondary | ICD-10-CM

## 2019-09-07 NOTE — Progress Notes (Signed)
Office Visit Note   Patient: Erika Larson           Date of Birth: December 24, 1970           MRN: [id number] Visit Date: 09/07/2019              Requested by: Billie Ruddy, MD [place],  [place] [address] PCP: Billie Ruddy, MD  Chief Complaint  Patient presents with  . Right Ankle - Follow-up    MRI Review      HPI: Patient is a 49 year old woman who presents status post a right ankle sprain she underwent injection back in April.  Patient states she feels good she is still limping she is wearing a Velcro ankle support she states that she has pain a 4 out of 10.  She is status post an MRI scan on June 2  Assessment & Plan: Visit Diagnoses:  1. Impingement syndrome of right ankle     Plan: Patient's MRI scan showed stable ankle ligaments and did show some plantar fasciitis that she is asymptomatic.  Patient was given instruction for Achilles stretching and recommend that she wean out of the ankle stabilizing orthosis.  She will return to work in August without restrictions.  Follow-Up Instructions: Return if symptoms worsen or fail to improve.   Ortho Exam  Patient is alert, oriented, no adenopathy, well-dressed, normal affect, normal respiratory effort. Examination patient has tenderness to palpation of the anterior talofibular ligament anterior drawer is stable the peroneal tendons are nontender to palpation she does have Achilles tightness with dorsiflexion to neutral to plantar fascial is not tender to palpation.  Review of the MRI scan does show thickening of the plantar fascia consistent with plantar fasciitis she has some mild Achilles tendinopathy this is consistent with her Achilles contracture the ankle ligaments are intact and stable.  Imaging: No results found. No images are attached to the encounter.  Labs: No results found for: HGBA1C, ESRSEDRATE, CRP, LABURIC, REPTSTATUS, GRAMSTAIN, CULT, LABORGA   Lab Results  Component  Value Date   ALBUMIN 4.0 09/27/2016   ALBUMIN 3.8 07/07/2014    No results found for: MG No results found for: VD25OH  No results found for: PREALBUMIN CBC EXTENDED Latest Ref Rng & Units 02/23/2019 09/27/2016 05/25/2016  WBC 4.0 - 10.5 K/uL 5.6 10.0 7.4  RBC 3.87 - 5.11 MIL/uL 3.87 4.34 4.02  HGB 12.0 - 15.0 g/dL 8.1(L) 12.1 7.5(L)  HCT 36.0 - 46.0 % 29.2(L) 38.6 26.6(L)  PLT 150 - 400 K/uL 213 279 247  NEUTROABS 1.7 - 7.7 K/uL - - 5.3  LYMPHSABS 0.7 - 4.0 K/uL - - 1.7     Body mass index is 34.3 kg/m.  Orders:  No orders of the defined types were placed in this encounter.  No orders of the defined types were placed in this encounter.    Procedures: No procedures performed  Clinical Data: No additional findings.  ROS:  All other systems negative, except as noted in the HPI. Review of Systems  Objective: Vital Signs: Ht 5\' 7"  (1.702 m)   Wt 219 lb (99.3 kg)   BMI 34.30 kg/m   Specialty Comments:  No specialty comments available.  PMFS History: Patient Active Problem List   Diagnosis Date Noted  . Status post hysterectomy 02/25/2019  . Essential hypertension 04/27/2018  . Arthritis 04/27/2018  . Cigarette nicotine dependence without complication 30/86/5784   Past Medical History:  Diagnosis Date  . Anemia   .  Arthritis   . Gall bladder stones 1998  . Hypertension     Family History  Problem Relation Age of Onset  . Arthritis Mother   . Cancer Mother   . Hearing loss Mother   . Hypertension Mother   . Cancer Sister   . Cancer Brother     Past Surgical History:  Procedure Laterality Date  . CHOLECYSTECTOMY    . CYSTOSCOPY Bilateral 02/25/2019   Procedure: CYSTOSCOPY;  Surgeon: Jonelle Sidle, MD;  Location[facility];  Service: Gynecology;  Laterality: Bilateral;  . ROBOTIC ASSISTED LAPAROSCOPIC HYSTERECTOMY AND SALPINGECTOMY Bilateral 02/25/2019   Procedure: XI ROBOTIC ASSISTED LAPAROSCOPIC HYSTERECTOMY AND  SALPINGECTOMY;  Surgeon: Jonelle Sidle, MD;  Location: [place];  Service: Gynecology;  Laterality: Bilateral;  . TUBAL LIGATION    . WISDOM TOOTH EXTRACTION     Social History   Occupational History  . Not on file  Tobacco Use  . Smoking status: Former Smoker    Packs/day: 0.25    Years: 2.00    Pack years: 0.50    Quit date: 02/02/2019    Years since quitting: 0.5  . Smokeless tobacco: Never Used  Substance and Sexual Activity  . Alcohol use: Never  . Drug use: No  . Sexual activity: Yes    Birth control/protection: None, I.U.D.

## 2019-09-07 NOTE — Telephone Encounter (Signed)
Pt LOV was on 06/11/2019 and last refill was done 08/05/2019 for 30 tablets, ok for refill

## 2019-10-15 ENCOUNTER — Encounter: Payer: Self-pay | Admitting: Family Medicine

## 2019-10-15 ENCOUNTER — Telehealth (INDEPENDENT_AMBULATORY_CARE_PROVIDER_SITE_OTHER): Payer: 59 | Admitting: Family Medicine

## 2019-10-15 VITALS — Ht 67.0 in | Wt 218.0 lb

## 2019-10-15 DIAGNOSIS — K219 Gastro-esophageal reflux disease without esophagitis: Secondary | ICD-10-CM

## 2019-10-15 DIAGNOSIS — R58 Hemorrhage, not elsewhere classified: Secondary | ICD-10-CM

## 2019-10-15 DIAGNOSIS — K5900 Constipation, unspecified: Secondary | ICD-10-CM

## 2019-10-15 DIAGNOSIS — R1012 Left upper quadrant pain: Secondary | ICD-10-CM

## 2019-10-15 NOTE — Progress Notes (Signed)
Virtual Visit via Video Note  I connected with Erika Larson on 10/15/19 at  2:30 PM EDT by a video enabled telemedicine application 2/2 TMHDQ-22 pandemic and verified that I am speaking with the correct person using two identifiers.  Location patient: home Location provider:work or home office Persons participating in the virtual visit: patient, provider  I discussed the limitations of evaluation and management by telemedicine and the availability of in person appointments. The patient expressed understanding and agreed to proceed.   HPI: Pt is a 49 yo female with pmh significant for HTN, gallstones, arthritis, and nicotine dependence who was seen for LUQ abdominal pain.  The pain causes pt to have a BM.  Pt having heart burn worse at night.  Started after eating lot of fried foods on vacation at [employer].  Pt notices the pain in the am prior to eating.  Pt having a hard time describing the pain.  Pt endorses blood in toilet 2 x last wk, abd bloating.  Pt unsure if blood was on toilet paper or on stool as she did not look.  Pt notes regular BMs.  Pt now having BMs 2-3 x per day.  Pt has an appt with another specialist for her foot.  Pt states her previous specialist said her foot will heal, but she is still having pain s/p R foot closed nondisplaced fourth metatarsal fracture.  Pt states she does not think she will be able to return to work standing on a hard concrete floor.  Patient had both maternal COVID-19 vaccines on 5/18 and 6/15.  ROS: See pertinent positives and negatives per HPI.  Past Medical History:  Diagnosis Date  . Anemia   . Arthritis   . Gall bladder stones 1998  . Hypertension     Past Surgical History:  Procedure Laterality Date  . CHOLECYSTECTOMY    . CYSTOSCOPY Bilateral 02/25/2019   Procedure: CYSTOSCOPY;  Surgeon: Jonelle Sidle, MD;  Location: [facility];  Service: Gynecology;  Laterality: Bilateral;  . ROBOTIC ASSISTED  LAPAROSCOPIC HYSTERECTOMY AND SALPINGECTOMY Bilateral 02/25/2019   Procedure: XI ROBOTIC ASSISTED LAPAROSCOPIC HYSTERECTOMY AND SALPINGECTOMY;  Surgeon: Jonelle Sidle, MD;  Location: [place];  Service: Gynecology;  Laterality: Bilateral;  . TUBAL LIGATION    . WISDOM TOOTH EXTRACTION      Family History  Problem Relation Age of Onset  . Arthritis Mother   . Cancer Mother   . Hearing loss Mother   . Hypertension Mother   . Cancer Sister   . Cancer Brother      Current Outpatient Medications:  .  docusate sodium (COLACE) 100 MG capsule, Take 1 capsule (100 mg total) by mouth daily., Disp: 30 capsule, Rfl: 0 .  ibuprofen (ADVIL) 800 MG tablet, Take 1 tablet (800 mg total) by mouth every 8 (eight) hours as needed., Disp: 30 tablet, Rfl: 0 .  Iron, Ferrous Sulfate, 325 (65 Fe) MG TABS, Take 325 mg by mouth daily. , Disp: , Rfl:  .  lisinopril (ZESTRIL) 10 MG tablet, Take 1 tablet (10 mg total) by mouth daily., Disp: 30 tablet, Rfl: 4 .  meloxicam (MOBIC) 15 MG tablet, TAKE 1 TABLET BY MOUTH EVERY DAY, Disp: 30 tablet, Rfl: 0 .  Omeprazole 20 MG TBEC, Take 40 mg by mouth daily as needed (acid reflux). , Disp: , Rfl:  .  oxyCODONE-acetaminophen (PERCOCET) 5-325 MG tablet, Take 1 tablet by mouth every 4 (four) hours as needed for severe pain., Disp:  20 tablet, Rfl: 0 .  simethicone (GAS-X) 80 MG chewable tablet, Chew 1 tablet (80 mg total) by mouth every 6 (six) hours as needed for flatulence., Disp: 30 tablet, Rfl: 0  EXAM:  VITALS per patient if applicable:  GENERAL: alert, oriented, appears well and in no acute distress  HEENT: atraumatic, conjunctiva clear, no obvious abnormalities on inspection of external nose and ears  NECK: normal movements of the head and neck  LUNGS: on inspection no signs of respiratory distress, breathing rate appears normal, no obvious gross SOB, gasping or wheezing  CV: no obvious cyanosis  MS: moves all visible extremities  without noticeable abnormality  PSYCH/NEURO: pleasant and cooperative, no obvious depression or anxiety, speech and thought processing grossly intact  ASSESSMENT AND PLAN:  Discussed the following assessment and plan:  Acute LUQ pain  -Discussed possible causes including GERD, ulcer -Discussed obtaining labs -Discussed diet modifications -Given precautions for continued or worsening symptoms - Plan: Ambulatory referral to Gastroenterology  Gastroesophageal reflux disease, unspecified whether esophagitis present  -Patient to increase Prilosec to 20 mg twice daily -Avoid foods known to cause problems - Plan: Ambulatory referral to Gastroenterology  Blood in toilet bowl -Discussed limitations of video visit for abdominal pain and other -Discussed obtaining labs -Given precautions - Plan: Ambulatory referral to Gastroenterology  Constipation, unspecified constipation type  - Plan: Ambulatory referral to Gastroenterology   I discussed the assessment and treatment plan with the patient. The patient was provided an opportunity to ask questions and all were answered. The patient agreed with the plan and demonstrated an understanding of the instructions.   The patient was advised to call back or seek an in-person evaluation if the symptoms worsen or if the condition fails to improve as anticipated.  I provided 18 minutes of non-face-to-face time during this encounter.   Billie Ruddy, MD

## 2019-10-20 ENCOUNTER — Encounter: Payer: Self-pay | Admitting: Gastroenterology

## 2019-10-21 ENCOUNTER — Other Ambulatory Visit: Payer: Self-pay | Admitting: Podiatry

## 2019-10-21 ENCOUNTER — Ambulatory Visit: Payer: 59 | Admitting: Podiatry

## 2019-10-21 ENCOUNTER — Encounter: Payer: Self-pay | Admitting: Podiatry

## 2019-10-21 ENCOUNTER — Ambulatory Visit (INDEPENDENT_AMBULATORY_CARE_PROVIDER_SITE_OTHER): Payer: 59

## 2019-10-21 ENCOUNTER — Other Ambulatory Visit: Payer: Self-pay

## 2019-10-21 VITALS — Temp 98.0°F

## 2019-10-21 DIAGNOSIS — M779 Enthesopathy, unspecified: Secondary | ICD-10-CM

## 2019-10-21 DIAGNOSIS — M25571 Pain in right ankle and joints of right foot: Secondary | ICD-10-CM | POA: Diagnosis not present

## 2019-10-21 MED ORDER — PREDNISONE 10 MG PO TABS: ORAL_TABLET | ORAL | 0 refills | Status: DC

## 2019-10-21 NOTE — Progress Notes (Signed)
° °  Subjective:    Patient ID: Erika Larson, female    DOB: 27-Feb-1971, 49 y.o.   MRN: [id number]  HPI    Review of Systems  All other systems reviewed and are negative.      Objective:   Physical Exam        Assessment & Plan:

## 2019-10-22 NOTE — Progress Notes (Signed)
Subjective:   Patient ID: Erika Larson, female   DOB: 49 y.o.   MRN: [id number]   HPI patient presents stating she was given a diagnosis of 2 broken bones in her right foot but the lack doctor said no and she has had pain and states that it is been present for a long period of time.  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  Patient does not smoke likes to be active   Review of Systems  All other systems reviewed and are negative.       Objective:  Physical Exam Vitals and nursing note reviewed.  Constitutional:      Appearance: She is well-developed.  Pulmonary:     Effort: Pulmonary effort is normal.  Musculoskeletal:        General: Normal range of motion.  Skin:    General: Skin is warm.  Neurological:     Mental Status: She is alert.     Neurovascular status intact muscle strength was found to be within normal limits with reduced range of motion around the subtalar joint right where it appears that she is splinting.  Patient has moderate swelling in the forefoot right but no area of intense discomfort and the pain seems to be diffuse.  Good digital perfusion well oriented x3     Assessment:  Possibility for some kind of inflammatory condition versus any kind of specific bone injury with no current indications of acute inflammatory condition     Plan:  H&P x-rays reviewed.  At this point I did do sterile prep and I injected into the subtalar joint 3 mg Kenalog 5 mg Xylocaine I discussed immobilization to try to let it rest and I applied air fracture walker to completely immobilize along with Sterapred DS Dosepak.  Hopefully this will take care of the acute inflammation and I reviewed her previous MRI which did not indicate any bone pathology.  Patient will be seen back to recheck 3 weeks and is instructed on reduced activity along with complete immobilization  X-rays indicate no signs of fracture or  indications of advanced arthritic process subtalar or ankle joint right

## 2019-11-11 ENCOUNTER — Ambulatory Visit: Payer: 59 | Admitting: Podiatry

## 2019-11-11 ENCOUNTER — Encounter: Payer: Self-pay | Admitting: Podiatry

## 2019-11-11 ENCOUNTER — Other Ambulatory Visit: Payer: Self-pay

## 2019-11-11 DIAGNOSIS — M779 Enthesopathy, unspecified: Secondary | ICD-10-CM

## 2019-11-17 NOTE — Progress Notes (Signed)
Subjective:   Patient ID: Erika Larson, female   DOB: 49 y.o.   MRN: [id number]   HPI Patient presents stating the heel and the ankle seem to be improving and states that the pain that she was experiencing is gradually getting better   ROS      Objective:  Physical Exam  Neurovascular status intact with patient found to have discomfort in the right ankle and it is painful only upon deep palpation and I was able to move the ankle without pain currently     Assessment:  Inflammatory capsulitis which appears to be improving     Plan:  Reviewed the continuation of physical therapy the continuation of anti-inflammatories supportive shoes and can introduce topicals.  Patient will be seen back as needed may require further injection or other more aggressive treatment depending on when symptoms return

## 2019-11-18 ENCOUNTER — Encounter: Payer: Self-pay | Admitting: Podiatry

## 2019-11-18 ENCOUNTER — Other Ambulatory Visit: Payer: Self-pay

## 2019-11-18 ENCOUNTER — Ambulatory Visit (INDEPENDENT_AMBULATORY_CARE_PROVIDER_SITE_OTHER): Payer: 59 | Admitting: Podiatry

## 2019-11-18 DIAGNOSIS — M778 Other enthesopathies, not elsewhere classified: Secondary | ICD-10-CM | POA: Diagnosis not present

## 2019-11-19 NOTE — Progress Notes (Signed)
Subjective:   Patient ID: Erika Larson, female   DOB: 49 y.o.   MRN: [id number]   HPI Patient states the ankles been feeling quite a bit better right but she is having a lot of pain on top of the foot and did not realize that secondary to the other pain that she had   ROS      Objective:  Physical Exam  Neurovascular status intact with improvement of lateral foot pain sinus tarsitis right with dorsal extensor tendinitis right that is painful     Assessment:  Extensor tendinitis right with inflammation with improved sinus tarsitis lateral ankle pain right     Plan:  Sterile prep injected the extensor tendon complex right 3 mg Dexasone Kenalog 5 mg Xylocaine advised on diclofenac gel therapy and reappoint to recheck as needed

## 2019-12-09 ENCOUNTER — Telehealth: Payer: Self-pay | Admitting: Family Medicine

## 2019-12-09 NOTE — Telephone Encounter (Signed)
Spoke with pt left a detailed message to call the office ASAP and schedule appointment for tomorrow at 10 am with DR Jerilee Hoh for BP check

## 2019-12-09 NOTE — Telephone Encounter (Signed)
BP was high when patient was at the [facility] dr according to husband.  Her OBGYN dr wants her pcp to increase her BP medication.

## 2019-12-14 NOTE — Telephone Encounter (Signed)
Called pt LVM for pt to call the office and schedule office visit for her BP

## 2019-12-16 ENCOUNTER — Telehealth: Payer: Self-pay | Admitting: Family Medicine

## 2019-12-16 ENCOUNTER — Other Ambulatory Visit: Payer: Self-pay

## 2019-12-16 ENCOUNTER — Ambulatory Visit: Payer: 59 | Admitting: Family Medicine

## 2019-12-16 ENCOUNTER — Emergency Department (HOSPITAL_COMMUNITY): Payer: 59

## 2019-12-16 ENCOUNTER — Emergency Department (HOSPITAL_COMMUNITY)
Admission: EM | Admit: 2019-12-16 | Discharge: 2019-12-16 | Disposition: A | Discharge status: Home / Self Care | Payer: 59 | Attending: Emergency Medicine | Admitting: Emergency Medicine

## 2019-12-16 ENCOUNTER — Encounter: Payer: Self-pay | Admitting: Family Medicine

## 2019-12-16 VITALS — BP 156/98 | HR 80 | Temp 98.4°F | Wt 226.0 lb

## 2019-12-16 DIAGNOSIS — Z87891 Personal history of nicotine dependence: Secondary | ICD-10-CM | POA: Insufficient documentation

## 2019-12-16 DIAGNOSIS — Z79899 Other long term (current) drug therapy: Secondary | ICD-10-CM | POA: Diagnosis not present

## 2019-12-16 DIAGNOSIS — R079 Chest pain, unspecified: Secondary | ICD-10-CM

## 2019-12-16 DIAGNOSIS — I10 Essential (primary) hypertension: Secondary | ICD-10-CM | POA: Insufficient documentation

## 2019-12-16 LAB — COMPREHENSIVE METABOLIC PANEL
ALT: 22 U/L (ref 0–44)
AST: 24 U/L (ref 15–41)
Albumin: 4.4 g/dL (ref 3.5–5.0)
Alkaline Phosphatase: 80 U/L (ref 38–126)
Anion gap: 10 (ref 5–15)
BUN: 11 mg/dL (ref 6–20)
CO2: 27 mmol/L (ref 22–32)
Calcium: 9.2 mg/dL (ref 8.9–10.3)
Chloride: 101 mmol/L (ref 98–111)
Creatinine, Ser: 0.86 mg/dL (ref 0.44–1.00)
GFR calc Af Amer: >60 mL/min (ref >60)
GFR calc non Af Amer: >60 mL/min (ref >60)
Glucose, Bld: 98 mg/dL (ref 70–99)
Potassium: 3.7 mmol/L (ref 3.5–5.1)
Sodium: 138 mmol/L (ref 135–145)
Total Bilirubin: 0.9 mg/dL (ref 0.3–1.2)
Total Protein: 8.4 g/dL — ABNORMAL HIGH (ref 6.5–8.1)

## 2019-12-16 LAB — CBC WITH DIFFERENTIAL/PLATELET
Abs Immature Granulocytes: 0.03 10*3/uL (ref 0.00–0.07)
Basophils Absolute: 0.1 10*3/uL (ref 0.0–0.1)
Basophils Relative: 1 %
Eosinophils Absolute: 0.3 10*3/uL (ref 0.0–0.5)
Eosinophils Relative: 3 %
HCT: 46.9 % — ABNORMAL HIGH (ref 36.0–46.0)
Hemoglobin: 15.7 g/dL — ABNORMAL HIGH (ref 12.0–15.0)
Immature Granulocytes: 0 %
Lymphocytes Relative: 25 %
Lymphs Abs: 2.1 10*3/uL (ref 0.7–4.0)
MCH: 31.3 pg (ref 26.0–34.0)
MCHC: 33.5 g/dL (ref 30.0–36.0)
MCV: 93.4 fL (ref 80.0–100.0)
Monocytes Absolute: 0.4 10*3/uL (ref 0.1–1.0)
Monocytes Relative: 4 %
Neutro Abs: 5.6 10*3/uL (ref 1.7–7.7)
Neutrophils Relative %: 67 %
Platelets: 215 10*3/uL (ref 150–400)
RBC: 5.02 MIL/uL (ref 3.87–5.11)
RDW: 13.1 % (ref 11.5–15.5)
WBC: 8.4 10*3/uL (ref 4.0–10.5)
nRBC: 0 % (ref 0.0–0.2)

## 2019-12-16 LAB — TROPONIN I (HIGH SENSITIVITY)
Troponin I (High Sensitivity): 4 ng/L (ref <18)
Troponin I (High Sensitivity): 4 ng/L (ref <18)

## 2019-12-16 MED ORDER — IRBESARTAN 75 MG PO TABS: 75.0000 mg | ORAL_TABLET | Freq: Every day | ORAL | 2 refills | Status: DC

## 2019-12-16 NOTE — ED Triage Notes (Signed)
Pt arrived via walk in, sent by PCP for possible STEMI, c/o  Chest pain, left arm pain and tightness.

## 2019-12-16 NOTE — ED Notes (Signed)
Patient ambulatory to and from the bathroom with a steady gait, no c/o discomfort noted at this time, spouse at the bedside.

## 2019-12-16 NOTE — Telephone Encounter (Signed)
error 

## 2019-12-16 NOTE — Patient Instructions (Addendum)
Given ST elevation on EKG and chest pain you were experiencing please advise that you proceed to nearest emergency department for further evaluation.  You were given aspirin 325 mg to chew in clinic.  Managing Your Hypertension Hypertension is commonly called high blood pressure. This is when the force of your blood pressing against the walls of your arteries is too strong. Arteries are blood vessels that carry blood from your heart throughout your body. Hypertension forces the heart to work harder to pump blood, and may cause the arteries to become narrow or stiff. Having untreated or uncontrolled hypertension can cause heart attack, stroke, kidney disease, and other problems. What are blood pressure readings? A blood pressure reading consists of a higher number over a lower number. Ideally, your blood pressure should be below 120/80. The first ("top") number is called the systolic pressure. It is a measure of the pressure in your arteries as your heart beats. The second ("bottom") number is called the diastolic pressure. It is a measure of the pressure in your arteries as the heart relaxes. What does my blood pressure reading mean? Blood pressure is classified into four stages. Based on your blood pressure reading, your health care provider may use the following stages to determine what type of treatment you need, if any. Systolic pressure and diastolic pressure are measured in a unit called mm Hg. Normal  Systolic pressure: below 161.  Diastolic pressure: below 80. Elevated  Systolic pressure: 096-045.  Diastolic pressure: below 80. Hypertension stage 1  Systolic pressure: 409-811.  Diastolic pressure: 91-47. Hypertension stage 2  Systolic pressure: 829 or above.  Diastolic pressure: 90 or above. What health risks are associated with hypertension? Managing your hypertension is an important responsibility. Uncontrolled hypertension can lead to:  A heart attack.  A stroke.  A  weakened blood vessel (aneurysm).  Heart failure.  Kidney damage.  Eye damage.  Metabolic syndrome.  Memory and concentration problems. What changes can I make to manage my hypertension? Hypertension can be managed by making lifestyle changes and possibly by taking medicines. Your health care provider will help you make a plan to bring your blood pressure within a normal range. Eating and drinking   Eat a diet that is high in fiber and potassium, and low in salt (sodium), added sugar, and fat. An example eating plan is called the DASH (Dietary Approaches to Stop Hypertension) diet. To eat this way: ? Eat plenty of fresh fruits and vegetables. Try to fill half of your plate at each meal with fruits and vegetables. ? Eat whole grains, such as whole wheat pasta, brown rice, or whole grain bread. Fill about one quarter of your plate with whole grains. ? Eat low-fat diary products. ? Avoid fatty cuts of meat, processed or cured meats, and poultry with skin. Fill about one quarter of your plate with lean proteins such as fish, chicken without skin, beans, eggs, and tofu. ? Avoid premade and processed foods. These tend to be higher in sodium, added sugar, and fat.  Reduce your daily sodium intake. Most people with hypertension should eat less than 1,500 mg of sodium a day.  Limit alcohol intake to no more than 1 drink a day for nonpregnant women and 2 drinks a day for men. One drink equals 12 oz of beer, 5 oz of wine, or 1 oz of hard liquor. Lifestyle  Work with your health care provider to maintain a healthy body weight, or to lose weight. Ask what an ideal weight  is for you.  Get at least 30 minutes of exercise that causes your heart to beat faster (aerobic exercise) most days of the week. Activities may include walking, swimming, or biking.  Include exercise to strengthen your muscles (resistance exercise), such as weight lifting, as part of your weekly exercise routine. Try to do these  types of exercises for 30 minutes at least 3 days a week.  Do not use any products that contain nicotine or tobacco, such as cigarettes and e-cigarettes. If you need help quitting, ask your health care provider.  Control any long-term (chronic) conditions you have, such as high cholesterol or diabetes. Monitoring  Monitor your blood pressure at home as told by your health care provider. Your personal target blood pressure may vary depending on your medical conditions, your age, and other factors.  Have your blood pressure checked regularly, as often as told by your health care provider. Working with your health care provider  Review all the medicines you take with your health care provider because there may be side effects or interactions.  Talk with your health care provider about your diet, exercise habits, and other lifestyle factors that may be contributing to hypertension.  Visit your health care provider regularly. Your health care provider can help you create and adjust your plan for managing hypertension. Will I need medicine to control my blood pressure? Your health care provider may prescribe medicine if lifestyle changes are not enough to get your blood pressure under control, and if:  Your systolic blood pressure is 130 or higher.  Your diastolic blood pressure is 80 or higher. Take medicines only as told by your health care provider. Follow the directions carefully. Blood pressure medicines must be taken as prescribed. The medicine does not work as well when you skip doses. Skipping doses also puts you at risk for problems. Contact a health care provider if:  You think you are having a reaction to medicines you have taken.  You have repeated (recurrent) headaches.  You feel dizzy.  You have swelling in your ankles.  You have trouble with your vision. Get help right away if:  You develop a severe headache or confusion.  You have unusual weakness or numbness, or you  feel faint.  You have severe pain in your chest or abdomen.  You vomit repeatedly.  You have trouble breathing. Summary  Hypertension is when the force of blood pumping through your arteries is too strong. If this condition is not controlled, it may put you at risk for serious complications.  Your personal target blood pressure may vary depending on your medical conditions, your age, and other factors. For most people, a normal blood pressure is less than 120/80.  Hypertension is managed by lifestyle changes, medicines, or both. Lifestyle changes include weight loss, eating a healthy, low-sodium diet, exercising more, and limiting alcohol. This information is not intended to replace advice given to you by your health care provider. Make sure you discuss any questions you have with your health care provider. Document Revised: 07/11/2018 Document Reviewed: 02/15/2016 Elsevier Patient Education  [place] DASH stands for "Dietary Approaches to Stop Hypertension." The DASH eating plan is a healthy eating plan that has been shown to reduce high blood pressure (hypertension). It may also reduce your risk for type 2 diabetes, heart disease, and stroke. The DASH eating plan may also help with weight loss. What are tips for following this plan?  General guidelines  Avoid eating  more than 2,300 mg (milligrams) of salt (sodium) a day. If you have hypertension, you may need to reduce your sodium intake to 1,500 mg a day.  Limit alcohol intake to no more than 1 drink a day for nonpregnant women and 2 drinks a day for men. One drink equals 12 oz of beer, 5 oz of wine, or 1 oz of hard liquor.  Work with your health care provider to maintain a healthy body weight or to lose weight. Ask what an ideal weight is for you.  Get at least 30 minutes of exercise that causes your heart to beat faster (aerobic exercise) most days of the week. Activities may include walking, swimming,  or biking.  Work with your health care provider or diet and nutrition specialist (dietitian) to adjust your eating plan to your individual calorie needs. Reading food labels   Check food labels for the amount of sodium per serving. Choose foods with less than 5 percent of the Daily Value of sodium. Generally, foods with less than 300 mg of sodium per serving fit into this eating plan.  To find whole grains, look for the word "whole" as the first word in the ingredient list. Shopping  Buy products labeled as "low-sodium" or "no salt added."  Buy fresh foods. Avoid canned foods and premade or frozen meals. Cooking  Avoid adding salt when cooking. Use salt-free seasonings or herbs instead of table salt or sea salt. Check with your health care provider or pharmacist before using salt substitutes.  Do not fry foods. Cook foods using healthy methods such as baking, boiling, grilling, and broiling instead.  Cook with heart-healthy oils, such as olive, canola, soybean, or sunflower oil. Meal planning  Eat a balanced diet that includes: ? 5 or more servings of fruits and vegetables each day. At each meal, try to fill half of your plate with fruits and vegetables. ? Up to 6-8 servings of whole grains each day. ? Less than 6 oz of lean meat, poultry, or fish each day. A 3-oz serving of meat is about the same size as a deck of cards. One egg equals 1 oz. ? 2 servings of low-fat dairy each day. ? A serving of nuts, seeds, or beans 5 times each week. ? Heart-healthy fats. Healthy fats called Omega-3 fatty acids are found in foods such as flaxseeds and coldwater fish, like sardines, salmon, and mackerel.  Limit how much you eat of the following: ? Canned or prepackaged foods. ? Food that is high in trans fat, such as fried foods. ? Food that is high in saturated fat, such as fatty meat. ? Sweets, desserts, sugary drinks, and other foods with added sugar. ? Full-fat dairy products.  Do not salt  foods before eating.  Try to eat at least 2 vegetarian meals each week.  Eat more home-cooked food and less restaurant, buffet, and fast food.  When eating at a restaurant, ask that your food be prepared with less salt or no salt, if possible. What foods are recommended? The items listed may not be a complete list. Talk with your dietitian about what dietary choices are best for you. Grains Whole-grain or whole-wheat bread. Whole-grain or whole-wheat pasta. Brown rice. [place]. Bulgur. Whole-grain and low-sodium cereals. Pita bread. Low-fat, low-sodium crackers. Whole-wheat flour tortillas. Vegetables Fresh or frozen vegetables (raw, steamed, roasted, or grilled). Low-sodium or reduced-sodium tomato and vegetable juice. Low-sodium or reduced-sodium tomato sauce and tomato paste. Low-sodium or reduced-sodium canned vegetables. Fruits All fresh,  dried, or frozen fruit. Canned fruit in natural juice (without added sugar). Meat and other protein foods Skinless chicken or [place]. Ground chicken or [place]. Pork with fat trimmed off. Fish and seafood. Egg whites. Dried beans, peas, or lentils. Unsalted nuts, nut butters, and seeds. Unsalted canned beans. Lean cuts of beef with fat trimmed off. Low-sodium, lean deli meat. Dairy Low-fat (1%) or fat-free (skim) milk. Fat-free, low-fat, or reduced-fat cheeses. Nonfat, low-sodium ricotta or cottage cheese. Low-fat or nonfat yogurt. Low-fat, low-sodium cheese. Fats and oils Soft margarine without trans fats. Vegetable oil. Low-fat, reduced-fat, or light mayonnaise and salad dressings (reduced-sodium). Canola, safflower, olive, soybean, and sunflower oils. Avocado. Seasoning and other foods Herbs. Spices. Seasoning mixes without salt. Unsalted popcorn and pretzels. Fat-free sweets. What foods are not recommended? The items listed may not be a complete list. Talk with your dietitian about what dietary choices are best for you. Grains Baked goods  made with fat, such as croissants, muffins, or some breads. Dry pasta or rice meal packs. Vegetables Creamed or fried vegetables. Vegetables in a cheese sauce. Regular canned vegetables (not low-sodium or reduced-sodium). Regular canned tomato sauce and paste (not low-sodium or reduced-sodium). Regular tomato and vegetable juice (not low-sodium or reduced-sodium). Angie Fava. Olives. Fruits Canned fruit in a light or heavy syrup. Fried fruit. Fruit in cream or butter sauce. Meat and other protein foods Fatty cuts of meat. Ribs. Fried meat. Berniece Salines. Sausage. Bologna and other processed lunch meats. Salami. Fatback. Hotdogs. Bratwurst. Salted nuts and seeds. Canned beans with added salt. Canned or smoked fish. Whole eggs or egg yolks. Chicken or [place] with skin. Dairy Whole or 2% milk, cream, and half-and-half. Whole or full-fat cream cheese. Whole-fat or sweetened yogurt. Full-fat cheese. Nondairy creamers. Whipped toppings. Processed cheese and cheese spreads. Fats and oils Butter. Stick margarine. Lard. Shortening. Ghee. Bacon fat. Tropical oils, such as coconut, palm kernel, or palm oil. Seasoning and other foods Salted popcorn and pretzels. Onion salt, garlic salt, seasoned salt, table salt, and sea salt. Worcestershire sauce. Tartar sauce. Barbecue sauce. Teriyaki sauce. Soy sauce, including reduced-sodium. Steak sauce. Canned and packaged gravies. Fish sauce. Oyster sauce. Cocktail sauce. Horseradish that you find on the shelf. Ketchup. Mustard. Meat flavorings and tenderizers. Bouillon cubes. Hot sauce and Tabasco sauce. Premade or packaged marinades. Premade or packaged taco seasonings. Relishes. Regular salad dressings. Where to find more information:  National Heart, Lung, and [place]: https://wilson-eaton.com/  American Heart Association: www.heart.org Summary  The DASH eating plan is a healthy eating plan that has been shown to reduce high blood pressure (hypertension). It may also reduce  your risk for type 2 diabetes, heart disease, and stroke.  With the DASH eating plan, you should limit salt (sodium) intake to 2,300 mg a day. If you have hypertension, you may need to reduce your sodium intake to 1,500 mg a day.  When on the DASH eating plan, aim to eat more fresh fruits and vegetables, whole grains, lean proteins, low-fat dairy, and heart-healthy fats.  Work with your health care provider or diet and nutrition specialist (dietitian) to adjust your eating plan to your individual calorie needs. This information is not intended to replace advice given to you by your health care provider. Make sure you discuss any questions you have with your health care provider. Document Revised: 03/01/2017 Document Reviewed: 03/12/2016 Elsevier Patient Education  [place].  Nonspecific Chest Pain Chest pain can be caused by many different conditions. Some causes of chest pain can be life-threatening.  These will require treatment right away. Serious causes of chest pain include:  Heart attack.  A tear in the body's main blood vessel.  Redness and swelling (inflammation) around your heart.  Blood clot in your lungs. Other causes of chest pain may not be so serious. These include:  Heartburn.  Anxiety or stress.  Damage to bones or muscles in your chest.  Lung infections. Chest pain can feel like:  Pain or discomfort in your chest.  Crushing, pressure, aching, or squeezing pain.  Burning or tingling.  Dull or sharp pain that is worse when you move, cough, or take a deep breath.  Pain or discomfort that is also felt in your back, neck, jaw, shoulder, or arm, or pain that spreads to any of these areas. It is hard to know whether your pain is caused by something that is serious or something that is not so serious. So it is important to see your doctor right away if you have chest pain. Follow these instructions at home: Medicines  Take over-the-counter and  prescription medicines only as told by your doctor.  If you were prescribed an antibiotic medicine, take it as told by your doctor. Do not stop taking the antibiotic even if you start to feel better. Lifestyle   Rest as told by your doctor.  Do not use any products that contain nicotine or tobacco, such as cigarettes, e-cigarettes, and chewing tobacco. If you need help quitting, ask your doctor.  Do not drink alcohol.  Make lifestyle changes as told by your doctor. These may include: ? Getting regular exercise. Ask your doctor what activities are safe for you. ? Eating a heart-healthy diet. A diet and nutrition specialist (dietitian) can help you to learn healthy eating options. ? Staying at a healthy weight. ? Treating diabetes or high blood pressure, if needed. ? Lowering your stress. Activities such as yoga and relaxation techniques can help. General instructions  Pay attention to any changes in your symptoms. Tell your doctor about them or any new symptoms.  Avoid any activities that cause chest pain.  Keep all follow-up visits as told by your doctor. This is important. You may need more testing if your chest pain does not go away. Contact a doctor if:  Your chest pain does not go away.  You feel depressed.  You have a fever. Get help right away if:  Your chest pain is worse.  You have a cough that gets worse, or you cough up blood.  You have very bad (severe) pain in your belly (abdomen).  You pass out (faint).  You have either of these for no clear reason: ? Sudden chest discomfort. ? Sudden discomfort in your arms, back, neck, or jaw.  You have shortness of breath at any time.  You suddenly start to sweat, or your skin gets clammy.  You feel sick to your stomach (nauseous).  You throw up (vomit).  You suddenly feel lightheaded or dizzy.  You feel very weak or tired.  Your heart starts to beat fast, or it feels like it is skipping beats. These symptoms  may be an emergency. Do not wait to see if the symptoms will go away. Get medical help right away. Call your local emergency services (911 in the [place] Do not drive yourself to the hospital. Summary  Chest pain can be caused by many different conditions. The cause may be serious and need treatment right away. If you have chest pain, see your doctor right away.  Follow your doctor's instructions for taking medicines and making lifestyle changes.  Keep all follow-up visits as told by your doctor. This includes visits for any further testing if your chest pain does not go away.  Be sure to know the signs that show that your condition has become worse. Get help right away if you have these symptoms. This information is not intended to replace advice given to you by your health care provider. Make sure you discuss any questions you have with your health care provider. Document Revised: 09/19/2017 Document Reviewed: 09/19/2017 Elsevier Patient Education  2020 [employer].

## 2019-12-16 NOTE — Progress Notes (Signed)
Subjective:    Patient ID: Erika Larson, female    DOB: 03/08/1971, 49 y.o.   MRN: [id number]  No chief complaint on file. Patient accompanied by her husband.  HPI Pt is a 49 year old female with pmh sig for HTN, GERD, arthritis, history of nicotine use who was seen for acute concern.  Pt with elevated bp at OB/Gyn appt last week.  Pt taking lisinopril 10 mg daily.  Per chart review pt should be out of med, but she states was able to get refills from pharmacy.  Pt and her husband note h/o dry cough since starting lisinopril years ago. Pt stopped eating at restaurants and is eating grilled meats and salad.  Pt has her home b/p cuff, 176/107 in clinic.  Pt mentions episodes of CP.  Woke up this am with substernal CP, having some now in office.  Denies HA, dizziness, changes in vision, pain in L arm, nausea.  Different from heart burn.    Past Medical History:  Diagnosis Date  . Anemia   . Arthritis   . Gall bladder stones 1998  . Hypertension     Allergies  Allergen Reactions  . Shellfish Allergy Anaphylaxis and Swelling    ROS General: Denies fever, chills, night sweats, changes in weight, changes in appetite HEENT: Denies headaches, ear pain, changes in vision, rhinorrhea, sore throat CV: Denies palpitations, orthopnea  +CP, SOB Pulm: Denies SOB, cough, wheezing GI: Denies abdominal pain, nausea, vomiting, diarrhea, constipation GU: Denies dysuria, hematuria, frequency, vaginal discharge Msk: Denies muscle cramps, joint pains Neuro: Denies weakness, numbness, tingling Skin: Denies rashes, bruising Psych: Denies depression, anxiety, hallucinations     Objective:    Blood pressure (!) 142/106, pulse 80, temperature 98.4 F (36.9 C), temperature source Oral, weight 226 lb (102.5 kg), SpO2 97 %. BP repeat 156/98 Patient's home BP cuff 176/107  Gen. Pleasant, well-nourished, in no distress, normal affect   HEENT: [place]/AT, face symmetric, conjunctiva clear, no scleral  icterus, PERRLA, EOMI, nares patent without drainage Lungs: no accessory muscle use, CTAB, no wheezes or rales Cardiovascular: RRR, no m/r/g, no peripheral edema Musculoskeletal: No TTP of chest wall.  No deformities, no cyanosis or clubbing, normal tone Neuro:  A&Ox3, CN II-XII intact, normal gait Skin:  Warm, no lesions/ rash   Wt Readings from Last 3 Encounters:  10/15/19 218 lb (98.9 kg)  09/07/19 219 lb (99.3 kg)  07/27/19 219 lb (99.3 kg)    Lab Results  Component Value Date   WBC 5.6 02/23/2019   HGB 8.1 (L) 02/23/2019   HCT 29.2 (L) 02/23/2019   PLT 213 02/23/2019   GLUCOSE 100 (H) 02/23/2019   ALT 17 09/27/2016   AST 19 09/27/2016   NA 139 02/23/2019   K 3.5 02/23/2019   CL 110 02/23/2019   CREATININE 0.82 02/23/2019   BUN 12 02/23/2019   CO2 21 (L) 02/23/2019   INR 0.99 08/06/2011    Assessment/Plan: Labs drawn at start of visit before lab closed.    Pt is a 49 yo female with pmh sig for HTN, arthritis, GERD, h/o tobacco use who presents with uncontrolled HTN and CP.  EKG in clinic with ST elevation in V1-3, T wave inversion in AVL, poor R wave progression.  Pt given aspirin 324 mg to chew in clinic.  Advised on EMS transport to ED for further evaluation, however patient declined.  Patient's husband agreed to transport patient to ED.  Patient given copy of today's EKG and EKG from 02/23/2019.  Essential hypertension  -uncontrolled -repeat 156/98 -discussed DASH diet -will d/c lisinopril 10 mg 2/2 dry cough -rx for irbesartan 75 mg sent to pharmacy -given handout - Plan: CMP with eGFR([facility]), Lipid panel  Chest pain, unspecified type  -Mild substernal CP while in clinic. -EKG this visit with new ST elevation in V1-3, T wave inversion in AVL, poor R wave progression.  Compared to previous EKG from 02/23/2019. -given ASA 324 mg in clinic. -Pt declined EMS transport to ED.  Pt to proceed to nearest ED.   - Plan: CBC with Differential/Platelet, Troponin I,  Lipid panel  F/u prn  Grier Mitts, MD

## 2019-12-16 NOTE — Telephone Encounter (Signed)
Pt is scheduled for appointment with Dr Volanda Napoleon this afternoon for her BP

## 2019-12-16 NOTE — ED Provider Notes (Signed)
[facility] DEPT Provider Note   CSN: [id number] Arrival date & time: 12/16/19  1754     History Chief Complaint  Patient presents with   Chest Pain    Erika Larson is a 49 y.o. female.  HPI Patient presents with chest pain.  States it was in the mid chest.  X1 to left arm.  Feeling better now.  No fevers.  Not exertional.  States it started after a nap today.  States she worked in [employer] today without any issue.  Has had her Covid vaccine.  Also blood pressure was elevated.  Seen at PCP and sent in for ST elevation on the EKG.  Reportedly was supposed to go by ambulance to [facility] patient refused and came by car here instead.  No swelling her legs.  No known coronary artery disease.  Patient does smoke.    Past Medical History:  Diagnosis Date   Anemia    Arthritis    Gall bladder stones 1998   Hypertension     Patient Active Problem List   Diagnosis Date Noted   Status post hysterectomy 02/25/2019   Essential hypertension 04/27/2018   Arthritis 04/27/2018   Cigarette nicotine dependence without complication 83/15/1761    Past Surgical History:  Procedure Laterality Date   CHOLECYSTECTOMY     CYSTOSCOPY Bilateral 02/25/2019   Procedure: CYSTOSCOPY;  Surgeon: Jonelle Sidle, MD;  Location: [facility];  Service: Gynecology;  Laterality: Bilateral;   ROBOTIC ASSISTED LAPAROSCOPIC HYSTERECTOMY AND SALPINGECTOMY Bilateral 02/25/2019   Procedure: XI ROBOTIC ASSISTED LAPAROSCOPIC HYSTERECTOMY AND SALPINGECTOMY;  Surgeon: Jonelle Sidle, MD;  Location: [facility];  Service: Gynecology;  Laterality: Bilateral;   TUBAL LIGATION     WISDOM TOOTH EXTRACTION       OB History    Gravida  4   Para  3   Term      Preterm      AB      Living  3     SAB      TAB      Ectopic      Multiple      Live Births              Family History  Problem  Relation Age of Onset   Arthritis Mother    Cancer Mother    Hearing loss Mother    Hypertension Mother    Cancer Sister    Cancer Brother     Social History   Tobacco Use   Smoking status: Former Smoker    Packs/day: 0.25    Years: 2.00    Pack years: 0.50    Quit date: 02/02/2019    Years since quitting: 0.8   Smokeless tobacco: Never Used  Vaping Use   Vaping Use: Never used  Substance Use Topics   Alcohol use: Never   Drug use: No    Home Medications Prior to Admission medications   Medication Sig Start Date End Date Taking? Authorizing Provider  irbesartan (AVAPRO) 75 MG tablet Take 1 tablet (75 mg total) by mouth daily. 12/16/19   Billie Ruddy, MD  Iron, Ferrous Sulfate, 325 (65 Fe) MG TABS Take 325 mg by mouth daily.     [provider]  meloxicam (MOBIC) 15 MG tablet TAKE 1 TABLET BY MOUTH EVERY DAY 08/05/19   Billie Ruddy, MD  predniSONE (DELTASONE) 10 MG tablet 12 day tapering dose 10/21/19   Regal,  Tamala Fothergill, DPM    Allergies    Shellfish allergy and Lisinopril  Review of Systems   Review of Systems  Physical Exam Updated Vital Signs BP (!) 165/103 (BP Location: Left Arm)    Pulse 68    Temp (!) 97.5 F (36.4 C) (Oral)    Resp 14    SpO2 100%   Physical Exam  ED Results / Procedures / Treatments   Labs (all labs ordered are listed, but only abnormal results are displayed) Labs Reviewed  COMPREHENSIVE METABOLIC PANEL - Abnormal; Notable for the following components:      Result Value   Total Protein 8.4 (*)    All other components within normal limits  CBC WITH DIFFERENTIAL/PLATELET - Abnormal; Notable for the following components:   Hemoglobin 15.7 (*)    HCT 46.9 (*)    All other components within normal limits  TROPONIN I (HIGH SENSITIVITY)  TROPONIN I (HIGH SENSITIVITY)    EKG EKG Interpretation  Date/Time:  Wednesday December 16 2019 18:02:04 EDT Ventricular Rate:  89 PR Interval:    QRS Duration: 90 QT  Interval:  369 QTC Calculation: 449 R Axis:   63 Text Interpretation: Sinus rhythm Anteroseptal infarct, old 12 Lead; [place]ar No significant change since last tracing Confirmed by Davonna Belling [phone]) on 12/16/2019 6:28:18 PM   Radiology DG Chest Portable 1 View  Result Date: 12/16/2019 CLINICAL DATA:  Chest pain. Additional history provided: Chest pain, left arm pain and tightness. EXAM: PORTABLE CHEST 1 VIEW COMPARISON:  Prior chest radiographs 07/07/2014 and earlier. FINDINGS: Heart size within normal limits. No appreciable airspace consolidation. No evidence of pleural effusion or pneumothorax. No acute bony abnormality identified. IMPRESSION: No evidence of active cardiopulmonary disease. Electronically Signed   By: Kellie Simmering DO   On: 12/16/2019 18:35    Procedures Procedures (including critical care time)  Medications Ordered in ED Medications - No data to display  ED Course  I have reviewed the triage vital signs and the nursing notes.  Pertinent labs & imaging results that were available during my care of the patient were reviewed by me and considered in my medical decision making (see chart for details).    MDM Rules/Calculators/A&P                          Patient with chest pain. Heart score of 3. EKG overall reassuring. Sent in for EKG changes but not as severe on my review as on PCPs review. Troponin negative x2. Not exertional. I think patient is safe for outpatient follow-up with PCP. Will discharge home. Final Clinical Impression(s) / ED Diagnoses Final diagnoses:  Nonspecific chest pain    Rx / DC Orders ED Discharge Orders    None       Davonna Belling, MD 12/16/19 2116

## 2019-12-17 LAB — CBC WITH DIFFERENTIAL/PLATELET
Absolute Monocytes: 263 cells/uL (ref 200–950)
Basophils Absolute: 44 cells/uL (ref 0–200)
Basophils Relative: 0.6 %
Eosinophils Absolute: 219 cells/uL (ref 15–500)
Eosinophils Relative: 3 %
HCT: 42.4 % (ref 35.0–45.0)
Hemoglobin: 14.5 g/dL (ref 11.7–15.5)
Lymphs Abs: 1803 cells/uL (ref 850–3900)
MCH: 31.3 pg (ref 27.0–33.0)
MCHC: 34.2 g/dL (ref 32.0–36.0)
MCV: 91.4 fL (ref 80.0–100.0)
MPV: 12.2 fL (ref 7.5–12.5)
Monocytes Relative: 3.6 %
Neutro Abs: 4971 cells/uL (ref 1500–7800)
Neutrophils Relative %: 68.1 %
Platelets: 189 10*3/uL (ref 140–400)
RBC: 4.64 10*6/uL (ref 3.80–5.10)
RDW: 12.7 % (ref 11.0–15.0)
Total Lymphocyte: 24.7 %
WBC: 7.3 10*3/uL (ref 3.8–10.8)

## 2019-12-17 LAB — COMPLETE METABOLIC PANEL WITH GFR
AG Ratio: 1.4 (calc) (ref 1.0–2.5)
ALT: 17 U/L (ref 6–29)
AST: 17 U/L (ref 10–35)
Albumin: 4.2 g/dL (ref 3.6–5.1)
Alkaline phosphatase (APISO): 75 U/L (ref 31–125)
BUN: 11 mg/dL (ref 7–25)
CO2: 26 mmol/L (ref 20–32)
Calcium: 8.7 mg/dL (ref 8.6–10.2)
Chloride: 104 mmol/L (ref 98–110)
Creat: 0.83 mg/dL (ref 0.50–1.10)
GFR, Est African American: 97 mL/min/{1.73_m2} (ref >=60)
GFR, Est Non African American: 84 mL/min/{1.73_m2} (ref >=60)
Globulin: 2.9 g/dL (calc) (ref 1.9–3.7)
Glucose, Bld: 75 mg/dL (ref 65–99)
Potassium: 3.9 mmol/L (ref 3.5–5.3)
Sodium: 139 mmol/L (ref 135–146)
Total Bilirubin: 0.6 mg/dL (ref 0.2–1.2)
Total Protein: 7.1 g/dL (ref 6.1–8.1)

## 2019-12-17 LAB — LIPID PANEL
Cholesterol: 166 mg/dL (ref <200)
HDL: 47 mg/dL — ABNORMAL LOW (ref >=50)
LDL Cholesterol (Calc): 98 mg/dL (calc)
Non-HDL Cholesterol (Calc): 119 mg/dL (calc) (ref <130)
Total CHOL/HDL Ratio: 3.5 (calc) (ref <5.0)
Triglycerides: 115 mg/dL (ref <150)

## 2019-12-17 LAB — TROPONIN I: Troponin I: 3 ng/L (ref <=47)

## 2019-12-18 ENCOUNTER — Ambulatory Visit: Payer: 59 | Admitting: Gastroenterology

## 2019-12-18 ENCOUNTER — Encounter: Payer: Self-pay | Admitting: Gastroenterology

## 2019-12-18 VITALS — BP 144/100 | HR 80 | Ht 62.25 in | Wt 225.4 lb

## 2019-12-18 DIAGNOSIS — K625 Hemorrhage of anus and rectum: Secondary | ICD-10-CM | POA: Diagnosis not present

## 2019-12-18 DIAGNOSIS — I10 Essential (primary) hypertension: Secondary | ICD-10-CM | POA: Diagnosis not present

## 2019-12-18 DIAGNOSIS — R1032 Left lower quadrant pain: Secondary | ICD-10-CM | POA: Diagnosis not present

## 2019-12-18 DIAGNOSIS — K649 Unspecified hemorrhoids: Secondary | ICD-10-CM | POA: Diagnosis not present

## 2019-12-18 MED ORDER — SUTAB 1479-225-188 MG PO TABS: 1.0000 | ORAL_TABLET | Freq: Once | ORAL | 0 refills | Status: AC

## 2019-12-18 NOTE — Patient Instructions (Addendum)
Make sure to take a daily fiber supplement  You have been scheduled for a colonoscopy. Please follow written instructions given to you at your visit today.  Please pick up your prep supplies at the pharmacy within the next 1-3 days. If you use inhalers (even only as needed), please bring them with you on the day of your procedure.  I appreciate the opportunity to care for you. Crawford Cellar, MD

## 2019-12-18 NOTE — Progress Notes (Addendum)
HPI :  49 year old female with a history of hypertension, gallstones, rectal bleeding, referred here by Grier Mitts, MD for rectal bleeding and abdominal pain.  She states on August 1 is when she first noted blood in her stool.  She states for about a week and a half after it started she was noticing red blood in each of her bowel movements.  She thought she may have had some hemorrhoid tissue at the time and thought it might have been related.  She denied any pain in the perianal area but did have some soreness at the time.  She denied any constipation or diarrhea otherwise.  No straining of her bowels.  Around the same time she had some left lower quadrant pain.  States it was a cramping sensation, lasted over to few days, was intermittent.  No fevers.  No nausea, no vomiting.  This eventually resolved on its own without any intervention.  She states over the past few weeks she is not had any symptoms at all and no recurrence.  She denies symptoms of rectal bleeding prior to August.  She has never had a prior colonoscopy.  No family history of colon cancer known.  She does have a strong family history of lymphoma (mother and 2 or 3 siblings).  She was seen in the emergency room 2 days ago for poorly controlled hypertension and some chest discomfort.  She was ruled out for MI, they changed her blood pressure medications and her blood pressure is improved.  Generally she is feeling better at this time.  She is accompanied by her husband, he is keeping her track of her blood pressure at home, states it is much better.   Past Medical History:  Diagnosis Date  . Anemia   . Arthritis   . Gallstones 1998  . Hypertension      Past Surgical History:  Procedure Laterality Date  . CHOLECYSTECTOMY    . CYSTOSCOPY Bilateral 02/25/2019   Procedure: CYSTOSCOPY;  Surgeon: Jonelle Sidle, MD;  Location: [facility];  Service: Gynecology;  Laterality: Bilateral;  . ROBOTIC  ASSISTED LAPAROSCOPIC HYSTERECTOMY AND SALPINGECTOMY Bilateral 02/25/2019   Procedure: XI ROBOTIC ASSISTED LAPAROSCOPIC HYSTERECTOMY AND SALPINGECTOMY;  Surgeon: Jonelle Sidle, MD;  Location: [facility];  Service: Gynecology;  Laterality: Bilateral;  . TUBAL LIGATION    . WISDOM TOOTH EXTRACTION     Family History  Problem Relation Age of Onset  . Arthritis Mother   . Hearing loss Mother   . Hypertension Mother   . Hypertension Sister   . Hypertension Brother   . Hypertension Sister   . Hypertension Sister   . Hypertension Sister   . Hypertension Sister   . Hypertension Brother   . Hypertension Brother   . Hypertension Brother    Social History   Tobacco Use  . Smoking status: Former Smoker    Packs/day: 0.25    Years: 2.00    Pack years: 0.50    Quit date: 02/02/2019    Years since quitting: 0.8  . Smokeless tobacco: Never Used  Vaping Use  . Vaping Use: Never used  Substance Use Topics  . Alcohol use: Yes    Comment: weekends  . Drug use: No   Current Outpatient Medications  Medication Sig Dispense Refill  . irbesartan (AVAPRO) 75 MG tablet Take 1 tablet (75 mg total) by mouth daily. 30 tablet 2  . Iron, Ferrous Sulfate, 325 (65 Fe) MG TABS Take 325 mg  by mouth daily.     . Sodium Sulfate-Mag Sulfate-KCl (SUTAB) [phone] MG TABS Take 1 kit by mouth once for 1 dose. MANUFACTURER CODES!! BIN: [id number] PCN: CN GROUP: [id number] MEMBER ID: [id number];HUO AS CASH;NO PRIOR AUTHORIZATION 24 tablet 0   No current facility-administered medications for this visit.   Allergies  Allergen Reactions  . Shellfish Allergy Anaphylaxis and Swelling  . Lisinopril     Dry cough     Review of Systems: All systems reviewed and negative except where noted in HPI.    DG Chest Portable 1 View  Result Date: 12/16/2019 CLINICAL DATA:  Chest pain. Additional history provided: Chest pain, left arm pain and tightness. EXAM: PORTABLE CHEST 1 VIEW COMPARISON:   Prior chest radiographs 07/07/2014 and earlier. FINDINGS: Heart size within normal limits. No appreciable airspace consolidation. No evidence of pleural effusion or pneumothorax. No acute bony abnormality identified. IMPRESSION: No evidence of active cardiopulmonary disease. Electronically Signed   By: Kellie Simmering DO   On: 12/16/2019 18:35   Lab Results  Component Value Date   WBC 8.4 12/16/2019   HGB 15.7 (H) 12/16/2019   HCT 46.9 (H) 12/16/2019   MCV 93.4 12/16/2019   PLT 215 12/16/2019    Lab Results  Component Value Date   CREATININE 0.86 12/16/2019   BUN 11 12/16/2019   NA 138 12/16/2019   K 3.7 12/16/2019   CL 101 12/16/2019   CO2 27 12/16/2019    Lab Results  Component Value Date   ALT 22 12/16/2019   AST 24 12/16/2019   ALKPHOS 80 12/16/2019   BILITOT 0.9 12/16/2019      Physical Exam: BP (!) 144/100 (BP Location: Left Arm, Patient Position: Sitting, Cuff Size: Normal)   Pulse 80   Ht 5' 2.25" (1.581 m) Comment: height measured without shoes  Wt 225 lb 6 oz (102.2 kg)   LMP 02/23/2019   BMI 40.89 kg/m  Constitutional: Pleasant,well-developed, female in no acute distress. HEENT: Normocephalic and atraumatic. Conjunctivae are normal. No scleral icterus. Neck supple.  Cardiovascular: Normal rate, regular rhythm.  Pulmonary/chest: Effort normal and breath sounds normal. No wheezing, rales or rhonchi. Abdominal: Soft, nondistended, nontender.  There are no masses palpable.  DRE - [place] standby - no fissure, hemorrhoids noted, no mass lesion Extremities: no edema Lymphadenopathy: No cervical adenopathy noted. Neurological: Alert and oriented to person place and time. Skin: Skin is warm and dry. No rashes noted. Psychiatric: Normal mood and affect. Behavior is normal.   ASSESSMENT AND PLAN: 49 year old female here for new patient assessment of the following:  Rectal bleeding / left lower quadrant pain / hemorrhoids - history as outlined above.   Quite possible her known hemorrhoids are the cause of the symptoms, however given her age and that she has never had prior colon cancer screening, recommend optical colonoscopy to further evaluate.  I discussed what colonoscopy is, risks / benefits of the exam and anesthesia and she wanted to proceed.  We will also evaluate for causes of her abdominal pain, rule out diverticulosis, etc.  Following discussion of risks and benefits she wants to proceed.  She can consider a daily fiber supplement in the interim to treat hemorrhoids.  She inquires about long-term therapy of hemorrhoids as these have bothered her intermittently in the past.  We can consider hemorrhoid banding if she is a candidate and has persistent symptoms pending the results of her colonoscopy.  She agreed.   Hypertension - recently has been poorly controlled  although addressed after her ED visit and seems to be improving.  Recommend that she maintain compliance with her meds, continue home BP checks, and follow-up with her primary care about this.  We want to make sure her blood pressure is controlled at the time of her colonoscopy for anesthesia etc.  She agreed  Isanti Cellar, MD [facility]  CC: Billie Ruddy, MD

## 2019-12-31 ENCOUNTER — Other Ambulatory Visit: Payer: Self-pay

## 2020-01-01 ENCOUNTER — Encounter: Payer: Self-pay | Admitting: Family Medicine

## 2020-01-01 ENCOUNTER — Ambulatory Visit (INDEPENDENT_AMBULATORY_CARE_PROVIDER_SITE_OTHER): Payer: 59 | Admitting: Family Medicine

## 2020-01-01 VITALS — BP 136/104 | HR 87 | Temp 98.3°F | Wt 223.0 lb

## 2020-01-01 DIAGNOSIS — M25512 Pain in left shoulder: Secondary | ICD-10-CM | POA: Diagnosis not present

## 2020-01-01 DIAGNOSIS — R0683 Snoring: Secondary | ICD-10-CM | POA: Diagnosis not present

## 2020-01-01 DIAGNOSIS — I10 Essential (primary) hypertension: Secondary | ICD-10-CM | POA: Diagnosis not present

## 2020-01-01 MED ORDER — IRBESARTAN 150 MG PO TABS: 150.0000 mg | ORAL_TABLET | Freq: Every day | ORAL | 3 refills | Status: DC

## 2020-01-01 NOTE — Progress Notes (Signed)
Subjective:    Patient ID: Erika Larson, female    DOB: 1971/03/04, 49 y.o.   MRN: [id number]  No chief complaint on file.   HPI Patient was seen today for ongoing concern.  Pt endorses L shoulder pain with overhead movements and when laying on side x several wks.  Pt has not tried anything for her symptoms.  Pt denies injury, hearing any pops, clicks, or feeling any tears.  Shoulder feels better with L arm adducted and internally rotated with forearm against abdomen.  Pt works for [employer] and often lifts heavy items like pans.   Pt sent to the ED at last [facility] 12/16/19 for EKG changes and CP.  Pt taking irbesartan 75 mg and ASA 81 mg for bp.  Not checking at home.  Pt denies iLE edema, changes in vision, insomnia, but snores and may nap in the afternoon.    Past Medical History:  Diagnosis Date  . Anemia   . Arthritis   . Gallstones 1998  . Hypertension     Allergies  Allergen Reactions  . Shellfish Allergy Anaphylaxis and Swelling  . Lisinopril     Dry cough    ROS General: Denies fever, chills, night sweats, changes in weight, changes in appetite  +snoring HEENT: Denies headaches, ear pain, changes in vision, rhinorrhea, sore throat CV: Denies CP, palpitations, SOB, orthopnea Pulm: Denies SOB, cough, wheezing GI: Denies abdominal pain, nausea, vomiting, diarrhea, constipation GU: Denies dysuria, hematuria, frequency, vaginal discharge Msk: Denies muscle cramps  +L shoulder pain Neuro: Denies weakness, numbness, tingling Skin: Denies rashes, bruising Psych: Denies depression, anxiety, hallucinations    Objective:    Blood pressure (!) 136/104, pulse 87, temperature 98.3 F (36.8 C), temperature source Oral, weight 223 lb (101.2 kg), last menstrual period 02/23/2019, SpO2 98 %.  Gen. Pleasant, well-nourished, in no distress, normal affect   HEENT: [place]/AT, face symmetric, conjunctiva clear, no scleral icterus, PERRLA, EOMI, nares patent without drainage Lungs: no  accessory muscle use, CTAB, no wheezes or rales Cardiovascular: RRR, no m/r/g, no peripheral edema Musculoskeletal: Asymmetry of shoulders-L slightly higher than R.  FROM active and passive of b/l shoulders with discomfort.  TTP of L AC joint. +Neers and Hawkins.  Negative empty can. No deformities, no cyanosis or clubbing, normal tone Neuro:  A&Ox3, CN II-XII intact, normal gait Skin:  Warm, no lesions/ rash   Wt Readings from Last 3 Encounters:  01/01/20 223 lb (101.2 kg)  12/18/19 225 lb 6 oz (102.2 kg)  12/16/19 226 lb (102.5 kg)    Lab Results  Component Value Date   WBC 8.4 12/16/2019   HGB 15.7 (H) 12/16/2019   HCT 46.9 (H) 12/16/2019   PLT 215 12/16/2019   GLUCOSE 98 12/16/2019   CHOL 166 12/16/2019   TRIG 115 12/16/2019   HDL 47 (L) 12/16/2019   LDLCALC 98 12/16/2019   ALT 22 12/16/2019   AST 24 12/16/2019   NA 138 12/16/2019   K 3.7 12/16/2019   CL 101 12/16/2019   CREATININE 0.86 12/16/2019   BUN 11 12/16/2019   CO2 27 12/16/2019   INR 0.99 08/06/2011    Assessment/Plan:  Acute pain of left shoulder  -discussed possible causes including AC joint arthritis, bursitis -discussed supportive care: ice, heat, tylenol, topical analgesic rubs -will obtain imaging given duration of symptoms - Plan: DG Shoulder Left  Essential hypertension -uncontrolled -will increase irbesartan from 75 mg to 150 mg -discussed the importance of lifestyle modifications -pt to check bp  at home regularly  -continue ASA 81 mg daily -will obtain sleep study to evaluate for OSA - Plan: [facility] Sleep Study, irbesartan (AVAPRO) 150 MG tablet  Snoring -advised may be contributing to elevated bp -weight loss encouraged.  - Plan: [facility] Sleep Study  F/u prn  Grier Mitts, MD

## 2020-01-01 NOTE — Patient Instructions (Addendum)
You can use Voltaren gel, heat, ice, massage, and stretching exercises for your shoulder.  You can also use biofreeze, icy hot, or aspercreme on it.  If you are still having pain/discomfort in it next week you can have the xray done at the [facility] office.  They take walk ins so you do not need an appointment.  A prescription for the increased dose of your blood pressure medicine was sent to your pharmacy.  You can take two of the 75 mg tabs that you currently have until you pick up the new prescription for Irbesartan 150 mg.  Managing Your Hypertension Hypertension is commonly called high blood pressure. This is when the force of your blood pressing against the walls of your arteries is too strong. Arteries are blood vessels that carry blood from your heart throughout your body. Hypertension forces the heart to work harder to pump blood, and may cause the arteries to become narrow or stiff. Having untreated or uncontrolled hypertension can cause heart attack, stroke, kidney disease, and other problems. What are blood pressure readings? A blood pressure reading consists of a higher number over a lower number. Ideally, your blood pressure should be below 120/80. The first ("top") number is called the systolic pressure. It is a measure of the pressure in your arteries as your heart beats. The second ("bottom") number is called the diastolic pressure. It is a measure of the pressure in your arteries as the heart relaxes. What does my blood pressure reading mean? Blood pressure is classified into four stages. Based on your blood pressure reading, your health care provider may use the following stages to determine what type of treatment you need, if any. Systolic pressure and diastolic pressure are measured in a unit called mm Hg. Normal  Systolic pressure: below 161.  Diastolic pressure: below 80. Elevated  Systolic pressure: 096-045.  Diastolic pressure: below 80. Hypertension stage  1  Systolic pressure: 409-811.  Diastolic pressure: 91-47. Hypertension stage 2  Systolic pressure: 829 or above.  Diastolic pressure: 90 or above. What health risks are associated with hypertension? Managing your hypertension is an important responsibility. Uncontrolled hypertension can lead to:  A heart attack.  A stroke.  A weakened blood vessel (aneurysm).  Heart failure.  Kidney damage.  Eye damage.  Metabolic syndrome.  Memory and concentration problems. What changes can I make to manage my hypertension? Hypertension can be managed by making lifestyle changes and possibly by taking medicines. Your health care provider will help you make a plan to bring your blood pressure within a normal range. Eating and drinking   Eat a diet that is high in fiber and potassium, and low in salt (sodium), added sugar, and fat. An example eating plan is called the DASH (Dietary Approaches to Stop Hypertension) diet. To eat this way: ? Eat plenty of fresh fruits and vegetables. Try to fill half of your plate at each meal with fruits and vegetables. ? Eat whole grains, such as whole wheat pasta, brown rice, or whole grain bread. Fill about one quarter of your plate with whole grains. ? Eat low-fat diary products. ? Avoid fatty cuts of meat, processed or cured meats, and poultry with skin. Fill about one quarter of your plate with lean proteins such as fish, chicken without skin, beans, eggs, and tofu. ? Avoid premade and processed foods. These tend to be higher in sodium, added sugar, and fat.  Reduce your daily sodium intake. Most people with hypertension should eat less  than 1,500 mg of sodium a day.  Limit alcohol intake to no more than 1 drink a day for nonpregnant women and 2 drinks a day for men. One drink equals 12 oz of beer, 5 oz of wine, or 1 oz of hard liquor. Lifestyle  Work with your health care provider to maintain a healthy body weight, or to lose weight. Ask what an  ideal weight is for you.  Get at least 30 minutes of exercise that causes your heart to beat faster (aerobic exercise) most days of the week. Activities may include walking, swimming, or biking.  Include exercise to strengthen your muscles (resistance exercise), such as weight lifting, as part of your weekly exercise routine. Try to do these types of exercises for 30 minutes at least 3 days a week.  Do not use any products that contain nicotine or tobacco, such as cigarettes and e-cigarettes. If you need help quitting, ask your health care provider.  Control any long-term (chronic) conditions you have, such as high cholesterol or diabetes. Monitoring  Monitor your blood pressure at home as told by your health care provider. Your personal target blood pressure may vary depending on your medical conditions, your age, and other factors.  Have your blood pressure checked regularly, as often as told by your health care provider. Working with your health care provider  Review all the medicines you take with your health care provider because there may be side effects or interactions.  Talk with your health care provider about your diet, exercise habits, and other lifestyle factors that may be contributing to hypertension.  Visit your health care provider regularly. Your health care provider can help you create and adjust your plan for managing hypertension. Will I need medicine to control my blood pressure? Your health care provider may prescribe medicine if lifestyle changes are not enough to get your blood pressure under control, and if:  Your systolic blood pressure is 130 or higher.  Your diastolic blood pressure is 80 or higher. Take medicines only as told by your health care provider. Follow the directions carefully. Blood pressure medicines must be taken as prescribed. The medicine does not work as well when you skip doses. Skipping doses also puts you at risk for problems. Contact a  health care provider if:  You think you are having a reaction to medicines you have taken.  You have repeated (recurrent) headaches.  You feel dizzy.  You have swelling in your ankles.  You have trouble with your vision. Get help right away if:  You develop a severe headache or confusion.  You have unusual weakness or numbness, or you feel faint.  You have severe pain in your chest or abdomen.  You vomit repeatedly.  You have trouble breathing. Summary  Hypertension is when the force of blood pumping through your arteries is too strong. If this condition is not controlled, it may put you at risk for serious complications.  Your personal target blood pressure may vary depending on your medical conditions, your age, and other factors. For most people, a normal blood pressure is less than 120/80.  Hypertension is managed by lifestyle changes, medicines, or both. Lifestyle changes include weight loss, eating a healthy, low-sodium diet, exercising more, and limiting alcohol. This information is not intended to replace advice given to you by your health care provider. Make sure you discuss any questions you have with your health care provider. Document Revised: 07/11/2018 Document Reviewed: 02/15/2016 Elsevier Patient Education  2020 [employer]  Shoulder Pain Many things can cause shoulder pain, including:  An injury.  Moving the shoulder in the same way again and again (overuse).  Joint pain (arthritis). Pain can come from:  Swelling and irritation (inflammation) of any part of the shoulder.  An injury to the shoulder joint.  An injury to: ? Tissues that connect muscle to bone (tendons). ? Tissues that connect bones to each other (ligaments). ? Bones. Follow these instructions at home: Watch for changes in your symptoms. Let your doctor know about them. Follow these instructions to help with your pain. If you have a sling:  Wear the sling as told by your doctor.  Remove it only as told by your doctor.  Loosen the sling if your fingers: ? Tingle. ? Become numb. ? Turn cold and blue.  Keep the sling clean.  If the sling is not waterproof: ? Do not let it get wet. ? Take the sling off when you shower or bathe. Managing pain, stiffness, and swelling   If told, put ice on the painful area: ? Put ice in a plastic bag. ? Place a towel between your skin and the bag. ? Leave the ice on for 20 minutes, 2-3 times a day. Stop putting ice on if it does not help with the pain.  Squeeze a soft ball or a foam pad as much as possible. This prevents swelling in the shoulder. It also helps to strengthen the arm. General instructions  Take over-the-counter and prescription medicines only as told by your doctor.  Keep all follow-up visits as told by your doctor. This is important. Contact a doctor if:  Your pain gets worse.  Medicine does not help your pain.  You have new pain in your arm, hand, or fingers. Get help right away if:  Your arm, hand, or fingers: ? Tingle. ? Are numb. ? Are swollen. ? Are painful. ? Turn white or blue. Summary  Shoulder pain can be caused by many things. These include injury, moving the shoulder in the same away again and again, and joint pain.  Watch for changes in your symptoms. Let your doctor know about them.  This condition may be treated with a sling, ice, and pain medicine.  Contact your doctor if the pain gets worse or you have new pain. Get help right away if your arm, hand, or fingers tingle or get numb, swollen, or painful.  Keep all follow-up visits as told by your doctor. This is important. This information is not intended to replace advice given to you by your health care provider. Make sure you discuss any questions you have with your health care provider. Document Revised: 10/01/2017 Document Reviewed: 10/01/2017 Elsevier Patient Education  [place].  Shoulder Exercises Ask your  health care provider which exercises are safe for you. Do exercises exactly as told by your health care provider and adjust them as directed. It is normal to feel mild stretching, pulling, tightness, or discomfort as you do these exercises. Stop right away if you feel sudden pain or your pain gets worse. Do not begin these exercises until told by your health care provider. Stretching exercises External rotation and abduction This exercise is sometimes called corner stretch. This exercise rotates your arm outward (external rotation) and moves your arm out from your body (abduction). 1. Stand in a doorway with one of your feet slightly in front of the other. This is called a staggered stance. If you cannot reach your forearms to  the door frame, stand facing a corner of a room. 2. Choose one of the following positions as told by your health care provider: ? Place your hands and forearms on the door frame above your head. ? Place your hands and forearms on the door frame at the height of your head. ? Place your hands on the door frame at the height of your elbows. 3. Slowly move your weight onto your front foot until you feel a stretch across your chest and in the front of your shoulders. Keep your head and chest upright and keep your abdominal muscles tight. 4. Hold for __________ seconds. 5. To release the stretch, shift your weight to your back foot. Repeat __________ times. Complete this exercise __________ times a day. Extension, standing 1. Stand and hold a broomstick, a cane, or a similar object behind your back. ? Your hands should be a little wider than shoulder width apart. ? Your palms should face away from your back. 2. Keeping your elbows straight and your shoulder muscles relaxed, move the stick away from your body until you feel a stretch in your shoulders (extension). ? Avoid shrugging your shoulders while you move the stick. Keep your shoulder blades tucked down toward the middle of  your back. 3. Hold for __________ seconds. 4. Slowly return to the starting position. Repeat __________ times. Complete this exercise __________ times a day. Range-of-motion exercises Pendulum  1. Stand near a wall or a surface that you can hold onto for balance. 2. Bend at the waist and let your left / right arm hang straight down. Use your other arm to support you. Keep your back straight and do not lock your knees. 3. Relax your left / right arm and shoulder muscles, and move your hips and your trunk so your left / right arm swings freely. Your arm should swing because of the motion of your body, not because you are using your arm or shoulder muscles. 4. Keep moving your hips and trunk so your arm swings in the following directions, as told by your health care provider: ? Side to side. ? Forward and backward. ? In clockwise and counterclockwise circles. 5. Continue each motion for __________ seconds, or for as long as told by your health care provider. 6. Slowly return to the starting position. Repeat __________ times. Complete this exercise __________ times a day. Shoulder flexion, standing  1. Stand and hold a broomstick, a cane, or a similar object. Place your hands a little more than shoulder width apart on the object. Your left / right hand should be palm up, and your other hand should be palm down. 2. Keep your elbow straight and your shoulder muscles relaxed. Push the stick up with your healthy arm to raise your left / right arm in front of your body, and then over your head until you feel a stretch in your shoulder (flexion). ? Avoid shrugging your shoulder while you raise your arm. Keep your shoulder blade tucked down toward the middle of your back. 3. Hold for __________ seconds. 4. Slowly return to the starting position. Repeat __________ times. Complete this exercise __________ times a day. Shoulder abduction, standing 1. Stand and hold a broomstick, a cane, or a similar  object. Place your hands a little more than shoulder width apart on the object. Your left / right hand should be palm up, and your other hand should be palm down. 2. Keep your elbow straight and your shoulder muscles relaxed. Push the object  across your body toward your left / right side. Raise your left / right arm to the side of your body (abduction) until you feel a stretch in your shoulder. ? Do not raise your arm above shoulder height unless your health care provider tells you to do that. ? If directed, raise your arm over your head. ? Avoid shrugging your shoulder while you raise your arm. Keep your shoulder blade tucked down toward the middle of your back. 3. Hold for __________ seconds. 4. Slowly return to the starting position. Repeat __________ times. Complete this exercise __________ times a day. Internal rotation  1. Place your left / right hand behind your back, palm up. 2. Use your other hand to dangle an exercise band, a towel, or a similar object over your shoulder. Grasp the band with your left / right hand so you are holding on to both ends. 3. Gently pull up on the band until you feel a stretch in the front of your left / right shoulder. The movement of your arm toward the center of your body is called internal rotation. ? Avoid shrugging your shoulder while you raise your arm. Keep your shoulder blade tucked down toward the middle of your back. 4. Hold for __________ seconds. 5. Release the stretch by letting go of the band and lowering your hands. Repeat __________ times. Complete this exercise __________ times a day. Strengthening exercises External rotation  1. Sit in a stable chair without armrests. 2. Secure an exercise band to a stable object at elbow height on your left / right side. 3. Place a soft object, such as a folded towel or a small pillow, between your left / right upper arm and your body to move your elbow about 4 inches (10 cm) away from your side. 4. Hold  the end of the exercise band so it is tight and there is no slack. 5. Keeping your elbow pressed against the soft object, slowly move your forearm out, away from your abdomen (external rotation). Keep your body steady so only your forearm moves. 6. Hold for __________ seconds. 7. Slowly return to the starting position. Repeat __________ times. Complete this exercise __________ times a day. Shoulder abduction  1. Sit in a stable chair without armrests, or stand up. 2. Hold a __________ weight in your left / right hand, or hold an exercise band with both hands. 3. Start with your arms straight down and your left / right palm facing in, toward your body. 4. Slowly lift your left / right hand out to your side (abduction). Do not lift your hand above shoulder height unless your health care provider tells you that this is safe. ? Keep your arms straight. ? Avoid shrugging your shoulder while you do this movement. Keep your shoulder blade tucked down toward the middle of your back. 5. Hold for __________ seconds. 6. Slowly lower your arm, and return to the starting position. Repeat __________ times. Complete this exercise __________ times a day. Shoulder extension 1. Sit in a stable chair without armrests, or stand up. 2. Secure an exercise band to a stable object in front of you so it is at shoulder height. 3. Hold one end of the exercise band in each hand. Your palms should face each other. 4. Straighten your elbows and lift your hands up to shoulder height. 5. Step back, away from the secured end of the exercise band, until the band is tight and there is no slack. 6. Squeeze your shoulder  blades together as you pull your hands down to the sides of your thighs (extension). Stop when your hands are straight down by your sides. Do not let your hands go behind your body. 7. Hold for __________ seconds. 8. Slowly return to the starting position. Repeat __________ times. Complete this exercise  __________ times a day. Shoulder row 1. Sit in a stable chair without armrests, or stand up. 2. Secure an exercise band to a stable object in front of you so it is at waist height. 3. Hold one end of the exercise band in each hand. Position your palms so that your thumbs are facing the ceiling (neutral position). 4. Bend each of your elbows to a 90-degree angle (right angle) and keep your upper arms at your sides. 5. Step back until the band is tight and there is no slack. 6. Slowly pull your elbows back behind you. 7. Hold for __________ seconds. 8. Slowly return to the starting position. Repeat __________ times. Complete this exercise __________ times a day. Shoulder press-ups  1. Sit in a stable chair that has armrests. Sit upright, with your feet flat on the floor. 2. Put your hands on the armrests so your elbows are bent and your fingers are pointing forward. Your hands should be about even with the sides of your body. 3. Push down on the armrests and use your arms to lift yourself off the chair. Straighten your elbows and lift yourself up as much as you comfortably can. ? Move your shoulder blades down, and avoid letting your shoulders move up toward your ears. ? Keep your feet on the ground. As you get stronger, your feet should support less of your body weight as you lift yourself up. 4. Hold for __________ seconds. 5. Slowly lower yourself back into the chair. Repeat __________ times. Complete this exercise __________ times a day. Wall push-ups  1. Stand so you are facing a stable wall. Your feet should be about one arm-length away from the wall. 2. Lean forward and place your palms on the wall at shoulder height. 3. Keep your feet flat on the floor as you bend your elbows and lean forward toward the wall. 4. Hold for __________ seconds. 5. Straighten your elbows to push yourself back to the starting position. Repeat __________ times. Complete this exercise __________ times a  day. This information is not intended to replace advice given to you by your health care provider. Make sure you discuss any questions you have with your health care provider. Document Revised: 07/11/2018 Document Reviewed: 04/18/2018 Elsevier Patient Education  [employer].

## 2020-01-05 ENCOUNTER — Encounter: Payer: Self-pay | Admitting: Family Medicine

## 2020-01-22 ENCOUNTER — Ambulatory Visit: Payer: 59 | Admitting: Family Medicine

## 2020-02-08 ENCOUNTER — Encounter: Payer: 59 | Admitting: Gastroenterology

## 2020-05-27 ENCOUNTER — Other Ambulatory Visit: Payer: Self-pay | Admitting: Family Medicine

## 2020-05-27 DIAGNOSIS — I10 Essential (primary) hypertension: Secondary | ICD-10-CM

## 2020-06-09 ENCOUNTER — Ambulatory Visit (INDEPENDENT_AMBULATORY_CARE_PROVIDER_SITE_OTHER): Payer: 59 | Admitting: Family Medicine

## 2020-06-09 ENCOUNTER — Other Ambulatory Visit: Payer: Self-pay

## 2020-06-09 ENCOUNTER — Ambulatory Visit (INDEPENDENT_AMBULATORY_CARE_PROVIDER_SITE_OTHER): Payer: 59

## 2020-06-09 ENCOUNTER — Encounter: Payer: Self-pay | Admitting: Family Medicine

## 2020-06-09 VITALS — BP 128/84 | HR 90 | Temp 98.4°F | Ht 63.39 in | Wt 224.6 lb

## 2020-06-09 DIAGNOSIS — I10 Essential (primary) hypertension: Secondary | ICD-10-CM | POA: Diagnosis not present

## 2020-06-09 DIAGNOSIS — L600 Ingrowing nail: Secondary | ICD-10-CM

## 2020-06-09 DIAGNOSIS — Z6839 Body mass index (BMI) 39.0-39.9, adult: Secondary | ICD-10-CM | POA: Diagnosis not present

## 2020-06-09 DIAGNOSIS — M25512 Pain in left shoulder: Secondary | ICD-10-CM

## 2020-06-09 DIAGNOSIS — G8929 Other chronic pain: Secondary | ICD-10-CM

## 2020-06-09 DIAGNOSIS — R600 Localized edema: Secondary | ICD-10-CM

## 2020-06-09 DIAGNOSIS — Z0000 Encounter for general adult medical examination without abnormal findings: Secondary | ICD-10-CM

## 2020-06-09 DIAGNOSIS — F439 Reaction to severe stress, unspecified: Secondary | ICD-10-CM | POA: Diagnosis not present

## 2020-06-09 DIAGNOSIS — Z1159 Encounter for screening for other viral diseases: Secondary | ICD-10-CM

## 2020-06-09 NOTE — Patient Instructions (Addendum)
You can use Voltaren gel for your shoulder pain.  It can be found over-the-counter at your local drugstore, [employer], [employer], or online.  Preventive Care 8-49 Years Old, Female Preventive care refers to lifestyle choices and visits with your health care provider that can promote health and wellness. This includes:  A yearly physical exam. This is also called an annual wellness visit.  Regular dental and eye exams.  Immunizations.  Screening for certain conditions.  Healthy lifestyle choices, such as: ? Eating a healthy diet. ? Getting regular exercise. ? Not using drugs or products that contain nicotine and tobacco. ? Limiting alcohol use. What can I expect for my preventive care visit? Physical exam Your health care provider will check your:  Height and weight. These may be used to calculate your BMI (body mass index). BMI is a measurement that tells if you are at a healthy weight.  Heart rate and blood pressure.  Body temperature.  Skin for abnormal spots. Counseling Your health care provider may ask you questions about your:  Past medical problems.  Family's medical history.  Alcohol, tobacco, and drug use.  Emotional well-being.  Home life and relationship well-being.  Sexual activity.  Diet, exercise, and sleep habits.  Work and work Statistician.  Access to firearms.  Method of birth control.  Menstrual cycle.  Pregnancy history. What immunizations do I need? Vaccines are usually given at various ages, according to a schedule. Your health care provider will recommend vaccines for you based on your age, medical history, and lifestyle or other factors, such as travel or where you work.   What tests do I need? Blood tests  Lipid and cholesterol levels. These may be checked every 5 years, or more often if you are over 39 years old.  Hepatitis C test.  Hepatitis B test. Screening  Lung cancer screening. You may have this screening every year starting  at age 49 if you have a 30-pack-year history of smoking and currently smoke or have quit within the past 15 years.  Colorectal cancer screening. ? All adults should have this screening starting at age 87 and continuing until age 62. ? Your health care provider may recommend screening at age 1 if you are at increased risk. ? You will have tests every 1-10 years, depending on your results and the type of screening test.  Diabetes screening. ? This is done by checking your blood sugar (glucose) after you have not eaten for a while (fasting). ? You may have this done every 1-3 years.  Mammogram. ? This may be done every 1-2 years. ? Talk with your health care provider about when you should start having regular mammograms. This may depend on whether you have a family history of breast cancer.  BRCA-related cancer screening. This may be done if you have a family history of breast, ovarian, tubal, or peritoneal cancers.  Pelvic exam and Pap test. ? This may be done every 3 years starting at age 42. ? Starting at age 28, this may be done every 5 years if you have a Pap test in combination with an HPV test. Other tests  STD (sexually transmitted disease) testing, if you are at risk.  Bone density scan. This is done to screen for osteoporosis. You may have this scan if you are at high risk for osteoporosis. Talk with your health care provider about your test results, treatment options, and if necessary, the need for more tests. Follow these instructions at home: Eating and drinking  Eat a diet that includes fresh fruits and vegetables, whole grains, lean protein, and low-fat dairy products.  Take vitamin and mineral supplements as recommended by your health care provider.  Do not drink alcohol if: ? Your health care provider tells you not to drink. ? You are pregnant, may be pregnant, or are planning to become pregnant.  If you drink alcohol: ? Limit how much you have to 0-1 drink a  day. ? Be aware of how much alcohol is in your drink. In the [place] one drink equals one 12 oz bottle of beer (355 mL), one 5 oz glass of wine (148 mL), or one 1 oz glass of hard liquor (44 mL).   Lifestyle  Take daily care of your teeth and gums. Brush your teeth every morning and night with fluoride toothpaste. Floss one time each day.  Stay active. Exercise for at least 30 minutes 5 or more days each week.  Do not use any products that contain nicotine or tobacco, such as cigarettes, e-cigarettes, and chewing tobacco. If you need help quitting, ask your health care provider.  Do not use drugs.  If you are sexually active, practice safe sex. Use a condom or other form of protection to prevent STIs (sexually transmitted infections).  If you do not wish to become pregnant, use a form of birth control. If you plan to become pregnant, see your health care provider for a prepregnancy visit.  If told by your health care provider, take low-dose aspirin daily starting at age 61.  Find healthy ways to cope with stress, such as: ? Meditation, yoga, or listening to music. ? Journaling. ? Talking to a trusted person. ? Spending time with friends and family. Safety  Always wear your seat belt while driving or riding in a vehicle.  Do not drive: ? If you have been drinking alcohol. Do not ride with someone who has been drinking. ? When you are tired or distracted. ? While texting.  Wear a helmet and other protective equipment during sports activities.  If you have firearms in your house, make sure you follow all gun safety procedures. What's next?  Visit your health care provider once a year for an annual wellness visit.  Ask your health care provider how often you should have your eyes and teeth checked.  Stay up to date on all vaccines. This information is not intended to replace advice given to you by your health care provider. Make sure you discuss any questions you have with your  health care provider. Document Revised: 12/22/2019 Document Reviewed: 11/28/2017 Elsevier Patient Education  2021 [employer] (9th ed., pp. [phone]). [place], [place]: [employer].">  Stress, Adult Stress is a normal reaction to life events. Stress is what you feel when life demands more than you are used to, or more than you think you can handle. Some stress can be useful, such as studying for a test or meeting a deadline at work. Stress that occurs too often or for too long can cause problems. It can affect your emotional health and interfere with relationships and normal daily activities. Too much stress can weaken your body's defense system (immune system) and increase your risk for physical illness. If you already have a medical problem, stress can make it worse. What are the causes? All sorts of life events can cause stress. An event that causes stress for one person may not be stressful for another person. Major life events, whether positive or  negative, commonly cause stress. Examples include:  Losing a job or starting a new job.  Losing a loved one.  Moving to a new town or home.  Getting married or divorced.  Having a baby.  Getting injured or sick. Less obvious life events can also cause stress, especially if they occur day after day or in combination with each other. Examples include:  Working long hours.  Driving in traffic.  Caring for children.  Being in debt.  Being in a difficult relationship. What are the signs or symptoms? Stress can cause emotional symptoms, including:  Anxiety. This is feeling worried, afraid, on edge, overwhelmed, or out of control.  Anger, including irritation or impatience.  Depression. This is feeling sad, down, helpless, or guilty.  Trouble focusing, remembering, or making decisions. Stress can cause physical symptoms, including:  Aches and pains. These may affect your head, neck, back, stomach, or  other areas of your body.  Tight muscles or a clenched jaw.  Low energy.  Trouble sleeping. Stress can cause unhealthy behaviors, including:  Eating to feel better (overeating) or skipping meals.  Working too much or putting off tasks.  Smoking, drinking alcohol, or using drugs to feel better. How is this diagnosed? Stress is diagnosed through an assessment by your health care provider. He or she may diagnose this condition based on:  Your symptoms and any stressful life events.  Your medical history.  Tests to rule out other causes of your symptoms. Depending on your condition, your health care provider may refer you to a specialist for further evaluation. How is this treated? Stress management techniques are the recommended treatment for stress. Medicine is not typically recommended for the treatment of stress. Techniques to reduce your reaction to stressful life events include:  Stress identification. Monitor yourself for symptoms of stress and identify what causes stress for you. These skills may help you to avoid or prepare for stressful events.  Time management. Set your priorities, keep a calendar of events, and learn to say no. Taking these actions can help you avoid making too many commitments. Techniques for coping with stress include:  Rethinking the problem. Try to think realistically about stressful events rather than ignoring them or overreacting. Try to find the positives in a stressful situation rather than focusing on the negatives.  Exercise. Physical exercise can release both physical and emotional tension. The key is to find a form of exercise that you enjoy and do it regularly.  Relaxation techniques. These relax the body and mind. The key is to find one or more that you enjoy and use the techniques regularly. Examples include: ? Meditation, deep breathing, or progressive relaxation techniques. ? Yoga or tai chi. ? Biofeedback, mindfulness techniques, or  journaling. ? Listening to music, being out in nature, or participating in other hobbies.  Practicing a healthy lifestyle. Eat a balanced diet, drink plenty of water, limit or avoid caffeine, and get plenty of sleep.  Having a strong support network. Spend time with family, friends, or other people you enjoy being around. Express your feelings and talk things over with someone you trust. Counseling or talk therapy with a mental health professional may be helpful if you are having trouble managing stress on your own.   Follow these instructions at home: Lifestyle  Avoid drugs.  Do not use any products that contain nicotine or tobacco, such as cigarettes, e-cigarettes, and chewing tobacco. If you need help quitting, ask your health care provider.  Limit alcohol intake to  no more than 1 drink a day for nonpregnant women and 2 drinks a day for men. One drink equals 12 oz of beer, 5 oz of wine, or 1 oz of hard liquor  Do not use alcohol or drugs to relax.  Eat a balanced diet that includes fresh fruits and vegetables, whole grains, lean meats, fish, eggs, and beans, and low-fat dairy. Avoid processed foods and foods high in added fat, sugar, and salt.  Exercise at least 30 minutes on 5 or more days each week.  Get 7-8 hours of sleep each night.   General instructions  Practice stress management techniques as discussed with your health care provider.  Drink enough fluid to keep your urine clear or pale yellow.  Take over-the-counter and prescription medicines only as told by your health care provider.  Keep all follow-up visits as told by your health care provider. This is important.   Contact a health care provider if:  Your symptoms get worse.  You have new symptoms.  You feel overwhelmed by your problems and can no longer manage them on your own. Get help right away if:  You have thoughts of hurting yourself or others. If you ever feel like you may hurt yourself or others, or  have thoughts about taking your own life, get help right away. You can go to your nearest emergency department or call:  Your local emergency services (911 in the [place]  A suicide crisis helpline, such as the [place] at (319)130-0637. This is open 24 hours a day. Summary  Stress is a normal reaction to life events. It can cause problems if it happens too often or for too long.  Practicing stress management techniques is the best way to treat stress.  Counseling or talk therapy with a mental health professional may be helpful if you are having trouble managing stress on your own. This information is not intended to replace advice given to you by your health care provider. Make sure you discuss any questions you have with your health care provider. Document Revised: 12/04/2019 Document Reviewed: 12/04/2019 Elsevier Patient Education  2021 [place].  Shoulder Pain Many things can cause shoulder pain, including:  An injury.  Moving the shoulder in the same way again and again (overuse).  Joint pain (arthritis). Pain can come from:  Swelling and irritation (inflammation) of any part of the shoulder.  An injury to the shoulder joint.  An injury to: ? Tissues that connect muscle to bone (tendons). ? Tissues that connect bones to each other (ligaments). ? Bones. Follow these instructions at home: Watch for changes in your symptoms. Let your doctor know about them. Follow these instructions to help with your pain. If you have a sling:  Wear the sling as told by your doctor. Remove it only as told by your doctor.  Loosen the sling if your fingers: ? Tingle. ? Become numb. ? Turn cold and blue.  Keep the sling clean.  If the sling is not waterproof: ? Do not let it get wet. ? Take the sling off when you shower or bathe. Managing pain, stiffness, and swelling  If told, put ice on the painful area: ? Put ice in a plastic bag. ? Place a  towel between your skin and the bag. ? Leave the ice on for 20 minutes, 2-3 times a day. Stop putting ice on if it does not help with the pain.  Squeeze a soft ball or a foam pad as  much as possible. This prevents swelling in the shoulder. It also helps to strengthen the arm.   General instructions  Take over-the-counter and prescription medicines only as told by your doctor.  Keep all follow-up visits as told by your doctor. This is important. Contact a doctor if:  Your pain gets worse.  Medicine does not help your pain.  You have new pain in your arm, hand, or fingers. Get help right away if:  Your arm, hand, or fingers: ? Tingle. ? Are numb. ? Are swollen. ? Are painful. ? Turn white or blue. Summary  Shoulder pain can be caused by many things. These include injury, moving the shoulder in the same away again and again, and joint pain.  Watch for changes in your symptoms. Let your doctor know about them.  This condition may be treated with a sling, ice, and pain medicine.  Contact your doctor if the pain gets worse or you have new pain. Get help right away if your arm, hand, or fingers tingle or get numb, swollen, or painful.  Keep all follow-up visits as told by your doctor. This is important. This information is not intended to replace advice given to you by your health care provider. Make sure you discuss any questions you have with your health care provider. Document Revised: 10/01/2017 Document Reviewed: 10/01/2017 Elsevier Patient Education  2021 [employer].

## 2020-06-09 NOTE — Progress Notes (Signed)
sSubjective:     Erika Larson is a 50 y.o. female and is here for a comprehensive physical exam. The patient reports several concerns.  Pt with intermittent L shoulder pain x a while. Tried po meds without relief.  Pt taking irbesartan 150 mg for bp.  Notes occasional edema in hands.  Decreasing sodium intake.  Pt with ingrown toenails causing pain, inquires about options.  Pt notes increased stress at work, feels irritated, typically goes to her room when gets home.  Pt with h/o partial hysterectomy.  Needs to schedule mammogram.  Social History   Socioeconomic History  . Marital status: Married    Spouse name: Not on file  . Number of children: 4  . Years of education: Not on file  . Highest education level: Not on file  Occupational History  . Occupation: Psychologist, forensic  Tobacco Use  . Smoking status: Current Every Day Smoker    Packs/day: 0.25    Years: 2.00    Pack years: 0.50    Types: Cigarettes    Last attempt to quit: 02/02/2019    Years since quitting: 1.3  . Smokeless tobacco: Never Used  Vaping Use  . Vaping Use: Never used  Substance and Sexual Activity  . Alcohol use: Yes    Comment: weekends  . Drug use: No  . Sexual activity: Yes    Birth control/protection: None, I.U.D.  Other Topics Concern  . Not on file  Social History Narrative  . Not on file   Social Determinants of Health   Financial Resource Strain: Not on file  Food Insecurity: Not on file  Transportation Needs: Not on file  Physical Activity: Not on file  Stress: Not on file  Social Connections: Not on file  Intimate Partner Violence: Not on file   Health Maintenance  Topic Date Due  . Hepatitis C Screening  Never done  . HIV Screening  Never done  . TETANUS/TDAP  Never done  . COLONOSCOPY (Pts 45-30yrs Insurance coverage will need to be confirmed)  Never done  . PAP SMEAR-Modifier  08/04/2019  . INFLUENZA VACCINE  Never done  . COVID-19 Vaccine (3 - Booster for Moderna  series) 03/16/2020  . HPV VACCINES  Aged Out    The following portions of the patient's history were reviewed and updated as appropriate: allergies, current medications, past family history, past medical history, past social history, past surgical history and problem list.  Review of Systems Pertinent items noted in HPI and remainder of comprehensive ROS otherwise negative.   Objective:    BP 128/84 (BP Location: Left Arm, Patient Position: Sitting, Cuff Size: Normal)   Pulse 90   Temp 98.4 F (36.9 C) (Oral)   Ht 5' 3.39" (1.61 m)   Wt 224 lb 9.6 oz (101.9 kg)   LMP 02/23/2019   SpO2 97%   BMI 39.30 kg/m  General appearance: alert, cooperative and no distress Head: Normocephalic, without obvious abnormality, atraumatic Eyes: conjunctivae/corneas clear. PERRL, EOM's intact. Fundi benign. Ears: normal TM's and external ear canals both ears Nose: Nares normal. Septum midline. Mucosa normal. No drainage or sinus tenderness. Throat: lips, mucosa, and tongue normal; teeth and gums normal Neck: no adenopathy, no carotid bruit, no JVD, supple, symmetrical, trachea midline and thyroid not enlarged, symmetric, no tenderness/mass/nodules Lungs: clear to auscultation bilaterally Heart: regular rate and rhythm, S1, S2 normal, no murmur, click, rub or gallop Abdomen: soft, non-tender; bowel sounds normal; no masses,  no organomegaly Extremities: extremities normal, atraumatic,  no cyanosis or edema Pulses: 2+ and symmetric Skin: Skin color, texture, turgor normal. No rashes or lesions  Ingrown toe nail. Lymph nodes: Cervical, supraclavicular, and axillary nodes normal. Neurologic: Alert and oriented X 3, normal strength and tone. Normal symmetric reflexes. Normal coordination and gait    Assessment:    Healthy female exam.      Plan:     Anticipatory guidance given including wearing seatbelts, smoke detectors in the home, increasing physical activity, increasing p.o. intake of water and  vegetables. -will obtain labs  -pap not indicated 2/2 partial hysterectomy -discussed colonoscopy for colon cancer screening -pt to schedule mammogram -given handout -next CPE 1 yr See After Visit Summary for Counseling Recommendations    Essential hypertension  -controlled -continue irbesartan 150 mg -continue lifestyle modifications - Plan: Basic metabolic panel  Ingrown toenail -supportive care  - Plan: Ambulatory referral to Podiatry  Chronic left shoulder pain  -discussed symptoms likely 2/2 arthritis -pt encouraged to consider XR.  Previously order on 01/01/20 -supportive care including voltaren gel -consider ortho referral  - Plan: CBC with Differential/Platelet  Class 2 severe obesity due to excess calories with serious comorbidity and body mass index (BMI) of 39.0 to 39.9 in adult ([place])  -lifestyle modifications - Plan: TSH, T4, Free, Hemoglobin A1c, Lipid panel, Vitamin D, 25-hydroxy, Vitamin B12  Stress -discussed self care -PHQ 9 score 9  -GAD 7 score 7 -continue to monitor - Plan: TSH, T4, Free  Encounter for hepatitis C screening test for low risk patient  - Plan: Hep C Antibody  F/u prn  Grier Mitts, MD

## 2020-06-10 LAB — LIPID PANEL
Cholesterol: 166 mg/dL (ref 0–200)
HDL: 43.8 mg/dL (ref >39.00)
LDL Cholesterol: 104 mg/dL — ABNORMAL HIGH (ref 0–99)
NonHDL: 122.46
Total CHOL/HDL Ratio: 4
Triglycerides: 93 mg/dL (ref 0.0–149.0)
VLDL: 18.6 mg/dL (ref 0.0–40.0)

## 2020-06-10 LAB — CBC WITH DIFFERENTIAL/PLATELET
Basophils Absolute: 0.1 10*3/uL (ref 0.0–0.1)
Basophils Relative: 0.7 % (ref 0.0–3.0)
Eosinophils Absolute: 0.2 10*3/uL (ref 0.0–0.7)
Eosinophils Relative: 2.4 % (ref 0.0–5.0)
HCT: 41.8 % (ref 36.0–46.0)
Hemoglobin: 13.8 g/dL (ref 12.0–15.0)
Lymphocytes Relative: 24 % (ref 12.0–46.0)
Lymphs Abs: 2 10*3/uL (ref 0.7–4.0)
MCHC: 33.1 g/dL (ref 30.0–36.0)
MCV: 91.9 fl (ref 78.0–100.0)
Monocytes Absolute: 0.3 10*3/uL (ref 0.1–1.0)
Monocytes Relative: 4 % (ref 3.0–12.0)
Neutro Abs: 5.6 10*3/uL (ref 1.4–7.7)
Neutrophils Relative %: 68.9 % (ref 43.0–77.0)
Platelets: 198 10*3/uL (ref 150.0–400.0)
RBC: 4.55 Mil/uL (ref 3.87–5.11)
RDW: 13.6 % (ref 11.5–15.5)
WBC: 8.2 10*3/uL (ref 4.0–10.5)

## 2020-06-10 LAB — T4, FREE: Free T4: 0.82 ng/dL (ref 0.60–1.60)

## 2020-06-10 LAB — BASIC METABOLIC PANEL
BUN: 14 mg/dL (ref 6–23)
CO2: 26 mEq/L (ref 19–32)
Calcium: 9.4 mg/dL (ref 8.4–10.5)
Chloride: 103 mEq/L (ref 96–112)
Creatinine, Ser: 0.93 mg/dL (ref 0.40–1.20)
GFR: 72.7 mL/min (ref >60.00)
Glucose, Bld: 77 mg/dL (ref 70–99)
Potassium: 4 mEq/L (ref 3.5–5.1)
Sodium: 139 mEq/L (ref 135–145)

## 2020-06-10 LAB — HEPATITIS C ANTIBODY
Hepatitis C Ab: NONREACTIVE
SIGNAL TO CUT-OFF: 0.21 (ref <1.00)

## 2020-06-10 LAB — C-REACTIVE PROTEIN: CRP: 2.6 mg/dL (ref 0.5–20.0)

## 2020-06-10 LAB — VITAMIN D 25 HYDROXY (VIT D DEFICIENCY, FRACTURES): VITD: 22.02 ng/mL — ABNORMAL LOW (ref 30.00–100.00)

## 2020-06-10 LAB — VITAMIN B12: Vitamin B-12: 418 pg/mL (ref 211–911)

## 2020-06-10 LAB — TSH: TSH: 1.17 u[IU]/mL (ref 0.35–4.50)

## 2020-06-10 LAB — HEMOGLOBIN A1C: Hgb A1c MFr Bld: 5.1 % (ref 4.6–6.5)

## 2020-06-13 LAB — LUPUS (SLE) ANALYSIS
Anti Nuclear Antibody (ANA): NEGATIVE
Complement C4, Serum: 29 mg/dL (ref 12–38)
ENA RNP Ab: 0.2 AI (ref 0.0–0.9)
ENA SM Ab Ser-aCnc: <0.2 AI (ref 0.0–0.9)
ENA SSA (RO) Ab: <0.2 AI (ref 0.0–0.9)
ENA SSB (LA) Ab: <0.2 AI (ref 0.0–0.9)
Mitochondrial Ab: <20 Units (ref 0.0–20.0)
Parietal Cell Ab: 14.4 Units (ref 0.0–20.0)
Scleroderma (Scl-70) (ENA) Antibody, IgG: <0.2 AI (ref 0.0–0.9)
Smooth Muscle Ab: 17 Units (ref 0–19)
Thyroperoxidase Ab SerPl-aCnc: 8 IU/mL (ref 0–34)
dsDNA Ab: 1 IU/mL (ref 0–9)

## 2020-06-14 ENCOUNTER — Other Ambulatory Visit: Payer: Self-pay | Admitting: Family Medicine

## 2020-06-14 DIAGNOSIS — E559 Vitamin D deficiency, unspecified: Secondary | ICD-10-CM

## 2020-06-14 MED ORDER — VITAMIN D (ERGOCALCIFEROL) 1.25 MG (50000 UNIT) PO CAPS: 50000.0000 [IU] | ORAL_CAPSULE | ORAL | 0 refills | Status: DC

## 2020-06-14 NOTE — Progress Notes (Signed)
Results viewed on MyChart. 

## 2020-06-14 NOTE — Progress Notes (Signed)
Left detailed message on vm.

## 2020-06-16 ENCOUNTER — Other Ambulatory Visit: Payer: Self-pay

## 2020-06-16 ENCOUNTER — Ambulatory Visit (INDEPENDENT_AMBULATORY_CARE_PROVIDER_SITE_OTHER): Payer: 59 | Admitting: Podiatry

## 2020-06-16 ENCOUNTER — Encounter: Payer: Self-pay | Admitting: Podiatry

## 2020-06-16 DIAGNOSIS — L600 Ingrowing nail: Secondary | ICD-10-CM | POA: Diagnosis not present

## 2020-06-16 NOTE — Patient Instructions (Signed)

## 2020-06-19 NOTE — Progress Notes (Signed)
Subjective:   Patient ID: Erika Larson, female   DOB: 50 y.o.   MRN: [id number]   HPI Patient presents with ingrown toenail deformity right big toe that is sore and makes shoe gear difficult.  States that she is tried to trim it and soak it herself   ROS      Objective:  Physical Exam  Neurovascular status intact with incurvated right hallux nail medial lateral borders that are painful when pressed with no active drainage or redness noted currently     Assessment:  Ingrown toenail deformity right hallux border with pain     Plan:  H&P reviewed condition recommended correction of deformity.  Explained procedure risk patient wants surgery understanding risk and signed consent form.  I infiltrated the right hallux 60 mg like Marcaine mixture sterile prep done and using sterile instrumentation remove borders exposed matrix applied phenol 3 applications 30 seconds followed by alcohol lavage sterile dressing gave instructions on soaks and to leave dressing on 24 hours but take it off earlier if any throbbing were to occur

## 2020-06-23 ENCOUNTER — Encounter: Payer: Self-pay | Admitting: Family Medicine

## 2020-07-07 ENCOUNTER — Other Ambulatory Visit: Payer: Self-pay

## 2020-07-07 ENCOUNTER — Encounter: Payer: Self-pay | Admitting: Family Medicine

## 2020-07-07 ENCOUNTER — Ambulatory Visit (INDEPENDENT_AMBULATORY_CARE_PROVIDER_SITE_OTHER): Payer: 59 | Admitting: Family Medicine

## 2020-07-07 VITALS — BP 140/104 | HR 86 | Temp 97.9°F | Wt 223.6 lb

## 2020-07-07 DIAGNOSIS — M25561 Pain in right knee: Secondary | ICD-10-CM | POA: Diagnosis not present

## 2020-07-07 DIAGNOSIS — I10 Essential (primary) hypertension: Secondary | ICD-10-CM | POA: Diagnosis not present

## 2020-07-07 NOTE — Progress Notes (Signed)
Subjective:    Patient ID: Erika Larson, female    DOB: 1970/10/15, 50 y.o.   MRN: [id number]  Chief Complaint  Patient presents with  . Knee Pain    HPI Patient was seen today for acute concern.  Patient endorses right knee pain starting 2 days ago after kneeling down to clean her mothers refrigerator and home.  Pt notes edema, stiffness, and increased warmth of joint.  Tried Tylenol, ASA, and rubbing EtOH on knee.  Past Medical History:  Diagnosis Date  . Anemia   . Arthritis   . Gallstones 1998  . Hypertension     Allergies  Allergen Reactions  . Shellfish Allergy Anaphylaxis and Swelling  . Lisinopril     Dry cough    ROS General: Denies fever, chills, night sweats, changes in weight, changes in appetite HEENT: Denies headaches, ear pain, changes in vision, rhinorrhea, sore throat CV: Denies CP, palpitations, SOB, orthopnea Pulm: Denies SOB, cough, wheezing GI: Denies abdominal pain, nausea, vomiting, diarrhea, constipation GU: Denies dysuria, hematuria, frequency, vaginal discharge Msk: Denies muscle cramps +R knee pain Neuro: Denies weakness, numbness, tingling Skin: Denies rashes, bruising Psych: Denies depression, anxiety, hallucinations      Objective:    Blood pressure (!) 140/104, pulse 86, temperature 97.9 F (36.6 C), temperature source Oral, weight 223 lb 9.6 oz (101.4 kg), last menstrual period 02/23/2019, SpO2 99 %.  Gen. Pleasant, well-nourished, in no distress, normal affect   HEENT: [place]/AT, face symmetric, conjunctiva clear, no scleral icterus, PERRLA, EOMI, nares patent without drainage Lungs: no accessory muscle use Cardiovascular: RRR, no m/r/g, no peripheral edema Musculoskeletal: Mild edema and effusion of the right knee, increased warmth.  No crepitus.  TTP of lateral joint line >medial jt line.  No TTP of patella or irregular tracking.  No deformities, no cyanosis or clubbing, normal tone Neuro:  A&Ox3, CN II-XII intact, normal  gait Skin:  Warm, no lesions/ rash   Wt Readings from Last 3 Encounters:  07/07/20 223 lb 9.6 oz (101.4 kg)  06/09/20 224 lb 9.6 oz (101.9 kg)  01/01/20 223 lb (101.2 kg)    Lab Results  Component Value Date   WBC 8.2 06/09/2020   HGB 13.8 06/09/2020   HCT 41.8 06/09/2020   PLT 198.0 06/09/2020   GLUCOSE 77 06/09/2020   CHOL 166 06/09/2020   TRIG 93.0 06/09/2020   HDL 43.80 06/09/2020   LDLCALC 104 (H) 06/09/2020   ALT 22 12/16/2019   AST 24 12/16/2019   NA 139 06/09/2020   K 4.0 06/09/2020   CL 103 06/09/2020   CREATININE 0.93 06/09/2020   BUN 14 06/09/2020   CO2 26 06/09/2020   TSH 1.17 06/09/2020   INR 0.99 08/06/2011   HGBA1C 5.1 06/09/2020    Assessment/Plan:  Acute pain of right knee -Discussed likely causes including arthritis -Discussed supportive care including rest, ice, heat, massage, Tylenol -Avoid NSAIDs given elevation in BP -Patient to obtain Voltaren gel OTC -Given handout -For continued or worsening symptoms next week will obtain imaging.  Consider draining effusion if becomes larger.  Essential hypertension  -elevated likely 2/2 pain -Recheck remains elevated -continue irbesartan 150 mg -Continue lifestyle modifications  F/u next wk for bp recheck and knee  Grier Mitts, MD

## 2020-07-07 NOTE — Patient Instructions (Addendum)
You can use over-the-counter Voltaren gel on your knee up to 4 times a day.  You can also continue taking Tylenol 1000 mg 3 times daily as needed.   Acute Knee Pain, Adult Many things can cause knee pain. Sometimes, knee pain is sudden (acute) and may be caused by damage, swelling, or irritation of the muscles and tissues that support your knee. The pain often goes away on its own with time and rest. If the pain does not go away, tests may be done to find out what is causing the pain. Follow these instructions at home: If you have a knee sleeve or brace:  Wear the knee sleeve or brace as told by your doctor. Take it off only as told by your doctor.  Loosen it if your toes: ? Tingle. ? Become numb. ? Turn cold and blue.  Keep it clean.  If the knee sleeve or brace is not waterproof: ? Do not let it get wet. ? Cover it with a watertight covering when you take a bath or shower.   Activity  Rest your knee.  Do not do things that cause pain or make pain worse.  Avoid activities where both feet leave the ground at the same time (high-impact activities). Examples are running, jumping rope, and doing jumping jacks.  Work with a physical therapist to make a safe exercise program, as told by your doctor. Managing pain, stiffness, and swelling  If told, put ice on the knee. To do this: ? If you have a removable knee sleeve or brace, take it off as told by your doctor. ? Put ice in a plastic bag. ? Place a towel between your skin and the bag. ? Leave the ice on for 20 minutes, 2-3 times a day. ? Take off the ice if your skin turns bright red. This is very important. If you cannot feel pain, heat, or cold, you have a greater risk of damage to the area.  If told, use an elastic bandage to put pressure (compression) on your injured knee.  Raise your knee above the level of your heart while you are sitting or lying down.  Sleep with a pillow under your knee.   General instructions  Take  over-the-counter and prescription medicines only as told by your doctor.  Do not smoke or use any products that contain nicotine or tobacco. If you need help quitting, ask your doctor.  If you are overweight, work with your doctor and a food expert (dietitian) to set goals to lose weight. Being overweight can make your knee hurt more.  Watch for any changes in your symptoms.  Keep all follow-up visits. Contact a doctor if:  The knee pain does not stop.  The knee pain changes or gets worse.  You have a fever along with knee pain.  Your knee is red or feels warm when you touch it.  Your knee gives out or locks up. Get help right away if:  Your knee swells, and the swelling gets worse.  You cannot move your knee.  You have very bad knee pain that does not get better with pain medicine. Summary  Many things can cause knee pain. The pain often goes away on its own with time and rest.  Your doctor may do tests to find out the cause of the pain.  Watch for any changes in your symptoms. Relieve your pain with rest, medicines, light activity, and use of ice.  Get help right away if you  cannot move your knee or your knee pain is very bad. This information is not intended to replace advice given to you by your health care provider. Make sure you discuss any questions you have with your health care provider. Document Revised: 09/02/2019 Document Reviewed: 09/02/2019 Elsevier Patient Education  2021 [employer].  Arthritis Arthritis means joint pain. It can also mean joint disease. A joint is a place where bones come together. There are more than 100 types of arthritis. What are the causes? This condition may be caused by:  Wear and tear of a joint. This is the most common cause.  A lot of acid in the blood, which leads to pain in the joint (gout).  Pain and swelling (inflammation) in a joint.  Infection of a joint.  Injuries in the joint.  A reaction to medicines  (allergy). In some cases, the cause may not be known. What are the signs or symptoms? Symptoms of this condition include:  Redness at a joint.  Swelling at a joint.  Stiffness at a joint.  Warmth coming from the joint.  A fever.  A feeling of being sick. How is this treated? This condition may be treated with:  Treating the cause, if it is known.  Rest.  Raising (elevating) the joint.  Putting cold or hot packs on the joint.  Medicines to treat symptoms and reduce pain and swelling.  Shots of medicines (cortisone) into the joint. You may also be told to make changes in your life, such as doing exercises and losing weight. Follow these instructions at home: Medicines  Take over-the-counter and prescription medicines only as told by your doctor.  Do not take aspirin for pain if your doctor says that you may have gout. Activity  Rest your joint if your doctor tells you to.  Avoid activities that make the pain worse.  Exercise your joint regularly as told by your doctor. Try doing exercises like: ? Swimming. ? Water aerobics. ? Biking. ? Walking. Managing pain, stiffness, and swelling  If told, put ice on the affected area. ? Put ice in a plastic bag. ? Place a towel between your skin and the bag. ? Leave the ice on for 20 minutes, 2-3 times per day.  If your joint is swollen, raise (elevate) it above the level of your heart if told by your doctor.  If your joint feels stiff in the morning, try taking a warm shower.  If told, put heat on the affected area. Do this as often as told by your doctor. Use the heat source that your doctor recommends, such as a moist heat pack or a heating pad. If you have diabetes, do not apply heat without asking your doctor. To apply heat: ? Place a towel between your skin and the heat source. ? Leave the heat on for 20-30 minutes. ? Remove the heat if your skin turns bright red. This is very important if you are unable to feel  pain, heat, or cold. You may have a greater risk of getting burned.      General instructions  Do not use any products that contain nicotine or tobacco, such as cigarettes, e-cigarettes, and chewing tobacco. If you need help quitting, ask your doctor.  Keep all follow-up visits as told by your doctor. This is important. Contact a doctor if:  The pain gets worse.  You have a fever. Get help right away if:  You have very bad pain in your joint.  You have  swelling in your joint.  Your joint is red.  Many joints become painful and swollen.  You have very bad back pain.  Your leg is very weak.  You cannot control your pee (urine) or poop (stool). Summary  Arthritis means joint pain. It can also mean joint disease. A joint is a place where bones come together.  The most common cause of this condition is wear and tear of a joint.  Symptoms of this condition include redness, swelling, or stiffness of the joint.  This condition is treated with rest, raising the joint, medicines, and putting cold or hot packs on the joint.  Follow your doctor's instructions about medicines, activity, exercises, and other home care treatments. This information is not intended to replace advice given to you by your health care provider. Make sure you discuss any questions you have with your health care provider. Document Revised: 02/24/2018 Document Reviewed: 02/24/2018 Elsevier Patient Education  2021 [place].  Hypertension, Adult Hypertension is another name for high blood pressure. High blood pressure forces your heart to work harder to pump blood. This can cause problems over time. There are two numbers in a blood pressure reading. There is a top number (systolic) over a bottom number (diastolic). It is best to have a blood pressure that is below 120/80. Healthy choices can help lower your blood pressure, or you may need medicine to help lower it. What are the causes? The cause of this  condition is not known. Some conditions may be related to high blood pressure. What increases the risk?  Smoking.  Having type 2 diabetes mellitus, high cholesterol, or both.  Not getting enough exercise or physical activity.  Being overweight.  Having too much fat, sugar, calories, or salt (sodium) in your diet.  Drinking too much alcohol.  Having long-term (chronic) kidney disease.  Having a family history of high blood pressure.  Age. Risk increases with age.  Race. You may be at higher risk if you are African American.  Gender. Men are at higher risk than women before age 49. After age 38, women are at higher risk than men.  Having obstructive sleep apnea.  Stress. What are the signs or symptoms?  High blood pressure may not cause symptoms. Very high blood pressure (hypertensive crisis) may cause: ? Headache. ? Feelings of worry or nervousness (anxiety). ? Shortness of breath. ? Nosebleed. ? A feeling of being sick to your stomach (nausea). ? Throwing up (vomiting). ? Changes in how you see. ? Very bad chest pain. ? Seizures. How is this treated?  This condition is treated by making healthy lifestyle changes, such as: ? Eating healthy foods. ? Exercising more. ? Drinking less alcohol.  Your health care provider may prescribe medicine if lifestyle changes are not enough to get your blood pressure under control, and if: ? Your top number is above 130. ? Your bottom number is above 80.  Your personal target blood pressure may vary. Follow these instructions at home: Eating and drinking  If told, follow the DASH eating plan. To follow this plan: ? Fill one half of your plate at each meal with fruits and vegetables. ? Fill one fourth of your plate at each meal with whole grains. Whole grains include whole-wheat pasta, brown rice, and whole-grain bread. ? Eat or drink low-fat dairy products, such as skim milk or low-fat yogurt. ? Fill one fourth of your plate  at each meal with low-fat (lean) proteins. Low-fat proteins include fish, chicken without  skin, eggs, beans, and tofu. ? Avoid fatty meat, cured and processed meat, or chicken with skin. ? Avoid pre-made or processed food.  Eat less than 1,500 mg of salt each day.  Do not drink alcohol if: ? Your doctor tells you not to drink. ? You are pregnant, may be pregnant, or are planning to become pregnant.  If you drink alcohol: ? Limit how much you use to:  0-1 drink a day for women.  0-2 drinks a day for men. ? Be aware of how much alcohol is in your drink. In the [place] one drink equals one 12 oz bottle of beer (355 mL), one 5 oz glass of wine (148 mL), or one 1 oz glass of hard liquor (44 mL).   Lifestyle  Work with your doctor to stay at a healthy weight or to lose weight. Ask your doctor what the best weight is for you.  Get at least 30 minutes of exercise most days of the week. This may include walking, swimming, or biking.  Get at least 30 minutes of exercise that strengthens your muscles (resistance exercise) at least 3 days a week. This may include lifting weights or doing Pilates.  Do not use any products that contain nicotine or tobacco, such as cigarettes, e-cigarettes, and chewing tobacco. If you need help quitting, ask your doctor.  Check your blood pressure at home as told by your doctor.  Keep all follow-up visits as told by your doctor. This is important.   Medicines  Take over-the-counter and prescription medicines only as told by your doctor. Follow directions carefully.  Do not skip doses of blood pressure medicine. The medicine does not work as well if you skip doses. Skipping doses also puts you at risk for problems.  Ask your doctor about side effects or reactions to medicines that you should watch for. Contact a doctor if you:  Think you are having a reaction to the medicine you are taking.  Have headaches that keep coming back (recurring).  Feel  dizzy.  Have swelling in your ankles.  Have trouble with your vision. Get help right away if you:  Get a very bad headache.  Start to feel mixed up (confused).  Feel weak or numb.  Feel faint.  Have very bad pain in your: ? Chest. ? Belly (abdomen).  Throw up more than once.  Have trouble breathing. Summary  Hypertension is another name for high blood pressure.  High blood pressure forces your heart to work harder to pump blood.  For most people, a normal blood pressure is less than 120/80.  Making healthy choices can help lower blood pressure. If your blood pressure does not get lower with healthy choices, you may need to take medicine. This information is not intended to replace advice given to you by your health care provider. Make sure you discuss any questions you have with your health care provider. Document Revised: 11/27/2017 Document Reviewed: 11/27/2017 Elsevier Patient Education  2021 [employer].

## 2020-09-07 ENCOUNTER — Other Ambulatory Visit: Payer: Self-pay | Admitting: Family Medicine

## 2020-09-07 DIAGNOSIS — I10 Essential (primary) hypertension: Secondary | ICD-10-CM

## 2020-09-09 ENCOUNTER — Other Ambulatory Visit: Payer: Self-pay | Admitting: Family Medicine

## 2020-09-09 DIAGNOSIS — E559 Vitamin D deficiency, unspecified: Secondary | ICD-10-CM

## 2021-05-09 ENCOUNTER — Other Ambulatory Visit: Payer: Self-pay | Admitting: Family Medicine

## 2021-05-09 DIAGNOSIS — I10 Essential (primary) hypertension: Secondary | ICD-10-CM

## 2021-07-08 IMAGING — DX DG FOOT COMPLETE 3+V*R*
3 series · 3 of 3 positions shown · non-contrast
Comparison: None

CLINICAL DATA: Right foot pain for 2 weeks. Tenderness to palpation
of lateral midfoot and edema.

EXAM:
RIGHT FOOT COMPLETE - 3+ VIEW

[foot ap]
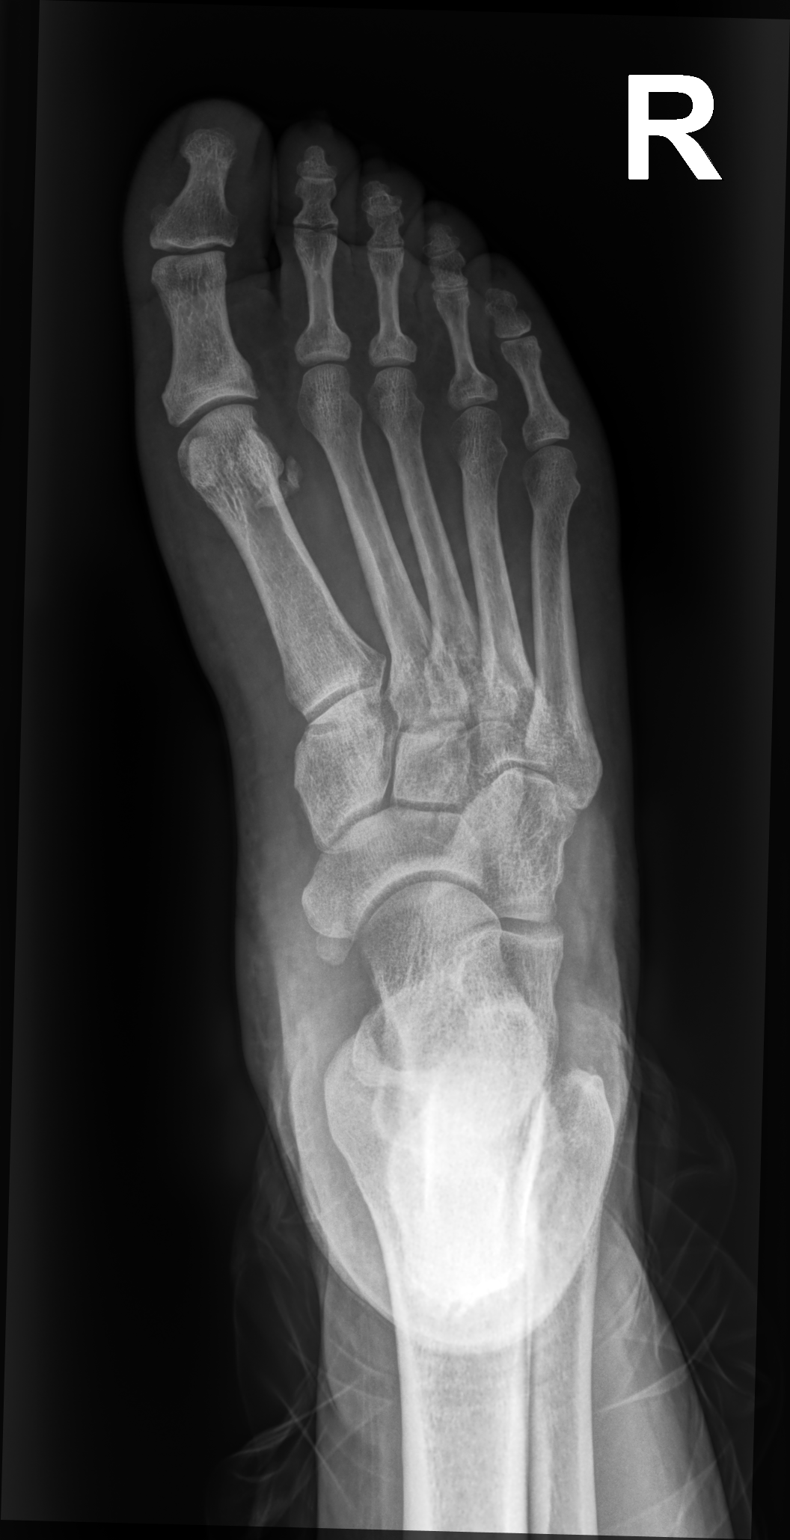

[foot mlo]
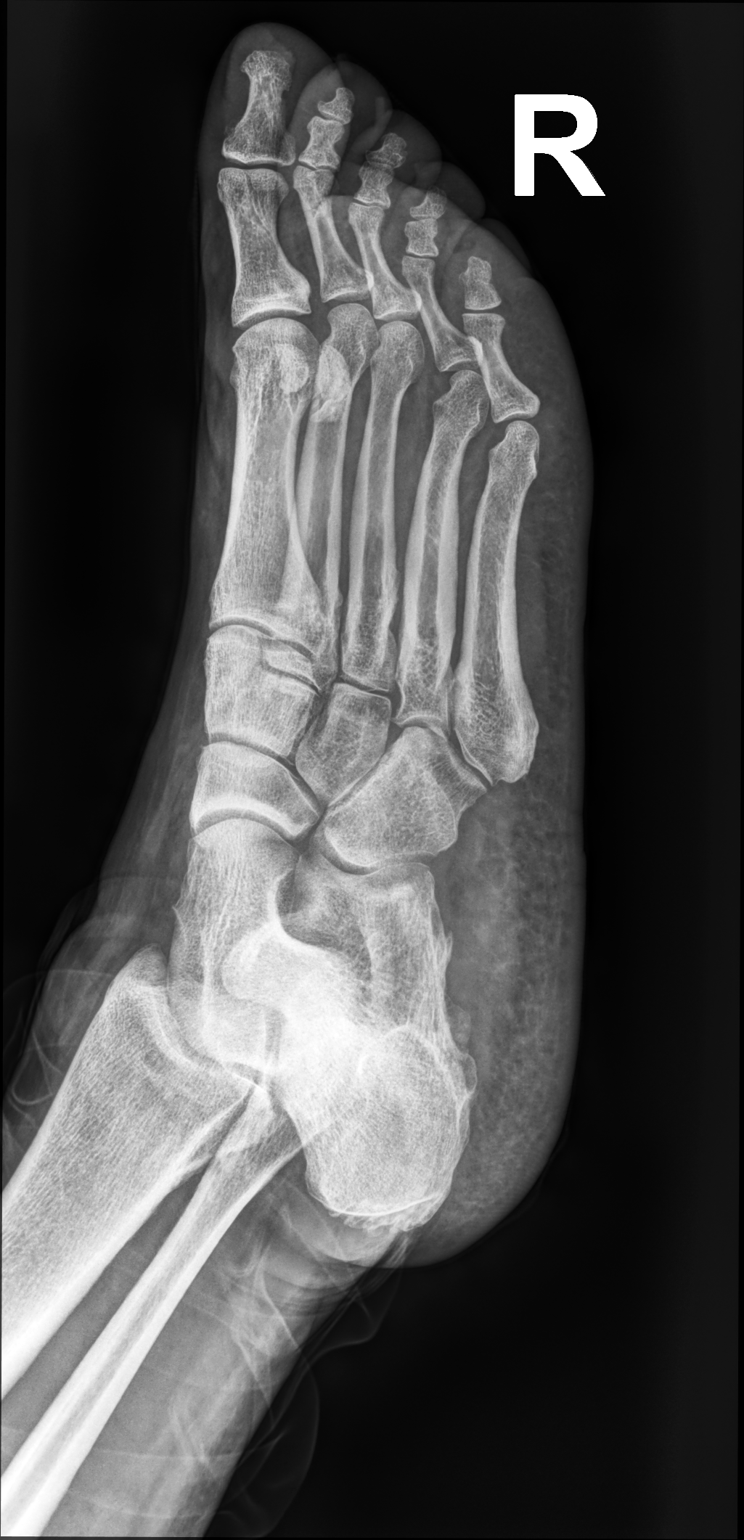

[foot lat]
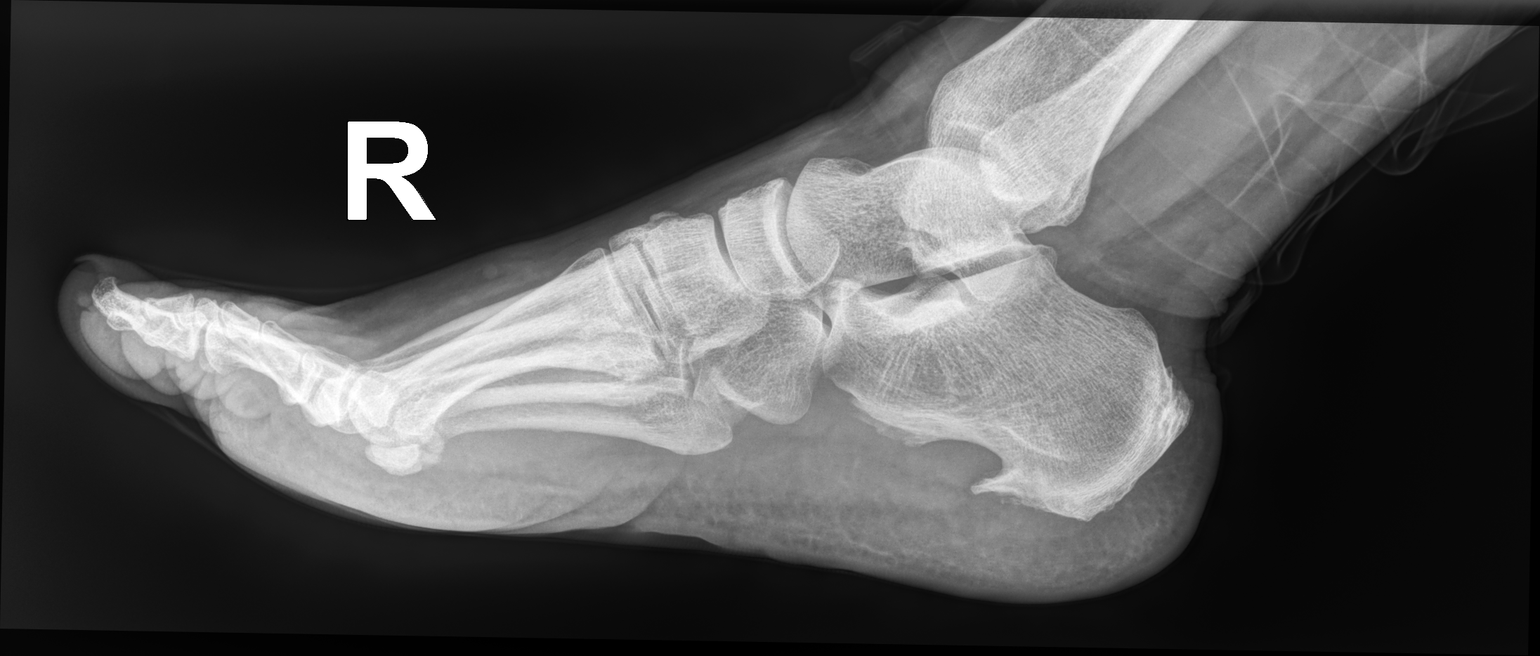

[3 of 3 positions shown; findings below may reference images not displayed]

FINDINGS: Signs of midfoot degenerative change and plantar enthesopathy. No
signs of acute fracture or dislocation. No significant soft tissue
swelling.
IMPRESSION: No acute findings. Signs of midfoot degenerative change and plantar
enthesopathy.

## 2021-09-29 IMAGING — MR MR ANKLE*R* W/O CM
4 of 6 series · 13 of 40 positions shown · non-contrast
Comparison: None.

CLINICAL DATA: Ankle pain and swelling for 2-3 months.

EXAM:
MRI OF THE RIGHT ANKLE WITHOUT CONTRAST
TECHNIQUE: Multiplanar, multisequence MR imaging of the ankle was performed. No
intravenous contrast was administered.

[Series 3: PD fat-sat · axial · right · 3.0mm · 0.25mm/px · z∈[-71,+49]mm · 4 of 36 slices shown]
[im 1/36]
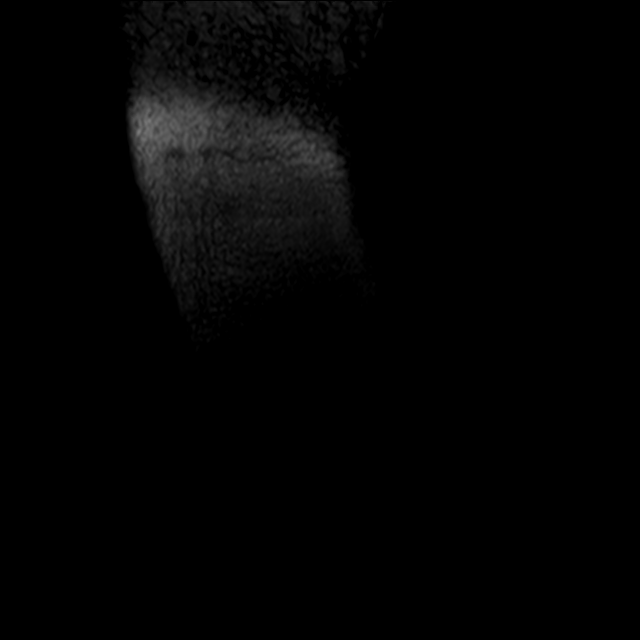
[im 5/36]
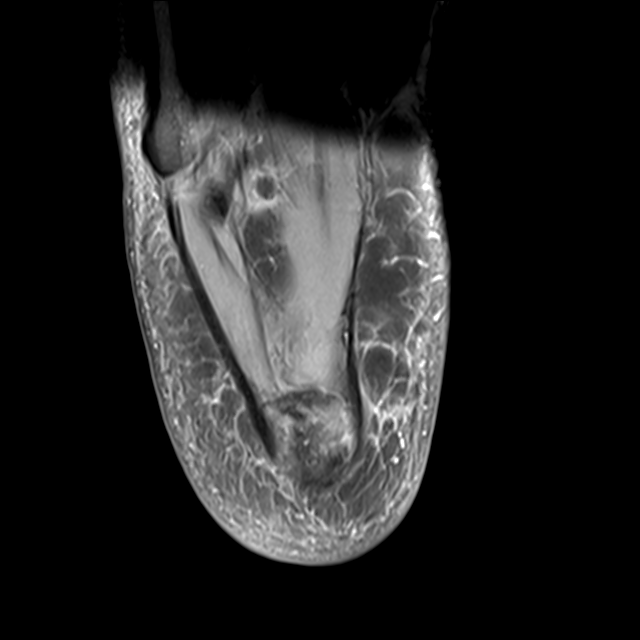
[im 18/36]
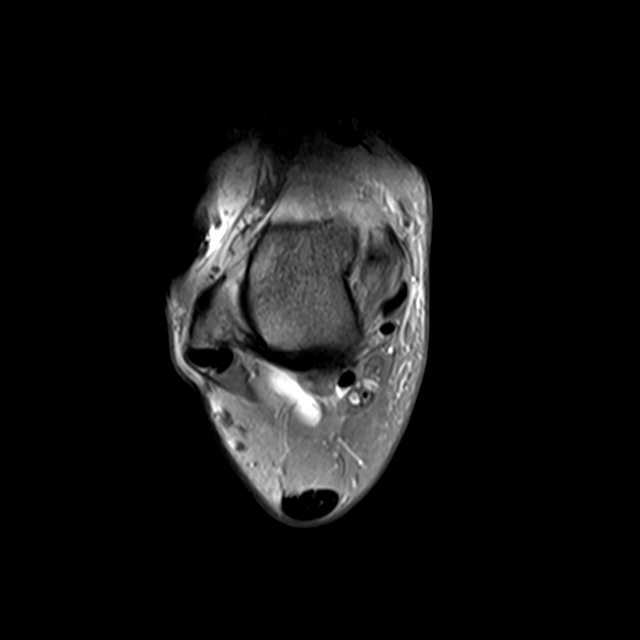
[im 31/36]
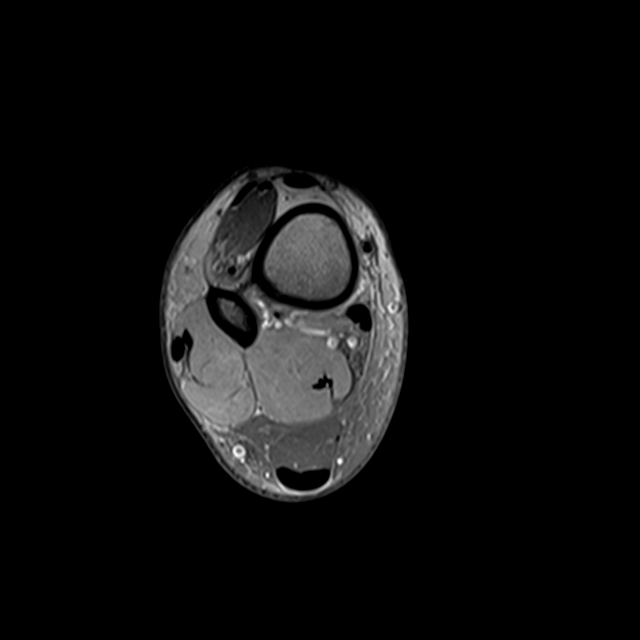

[Series 4: T2 fat-sat · axial · right · 3.0mm · 0.25mm/px · z∈[-51,+49]mm · 3 of 36 slices shown (1 of 2)]
[im 6/36]
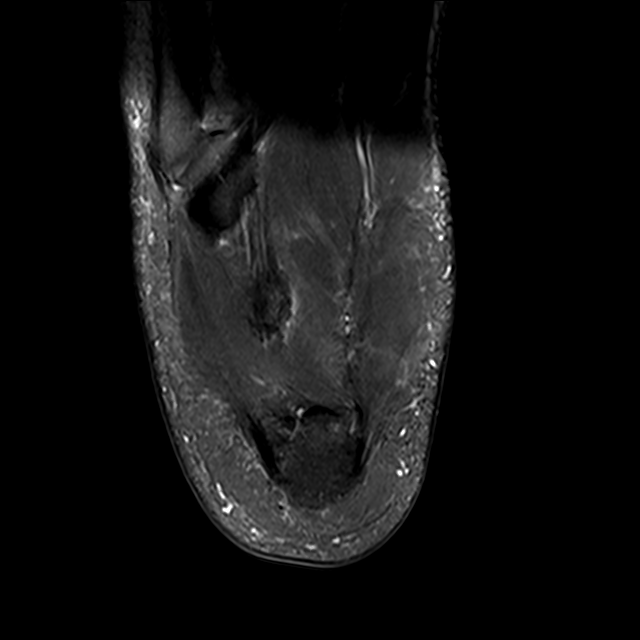
[im 21/36]
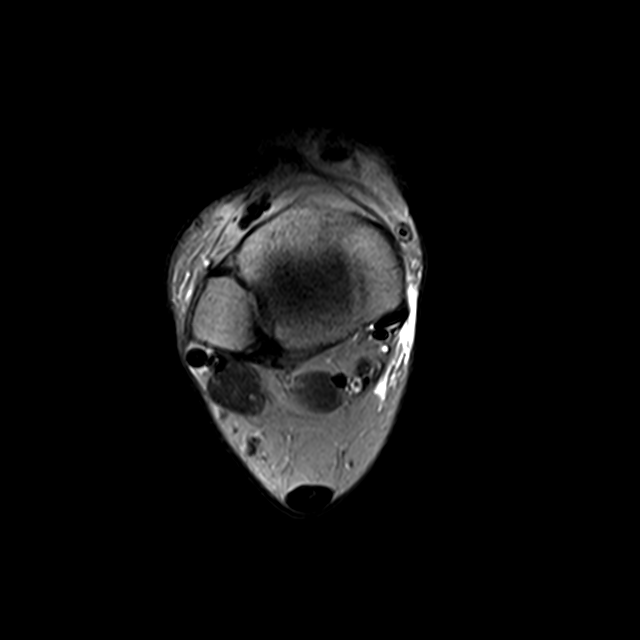
[im 31/36]
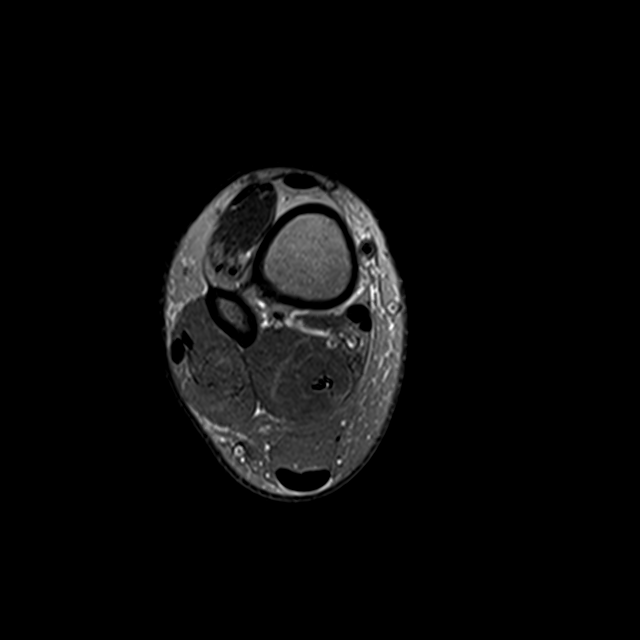

[Series 5: T1 · sagittal · right · 4.0mm · 0.27mm/px · 3 of 24 slices shown]
[im 1/24]
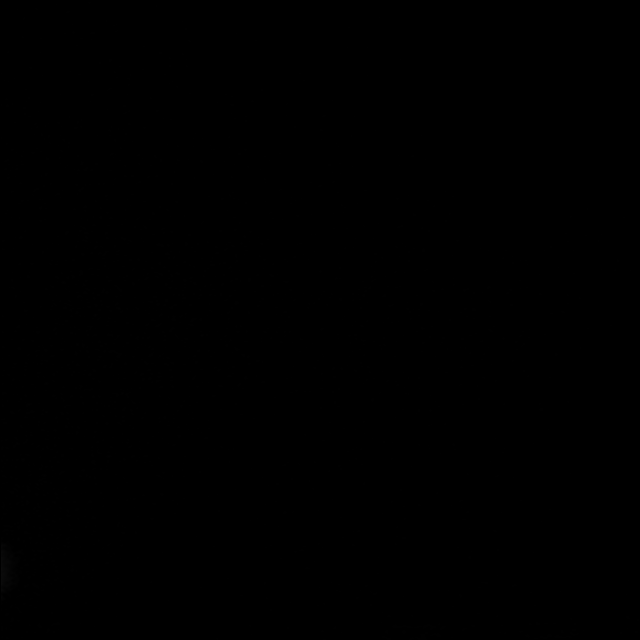
[im 12/24]
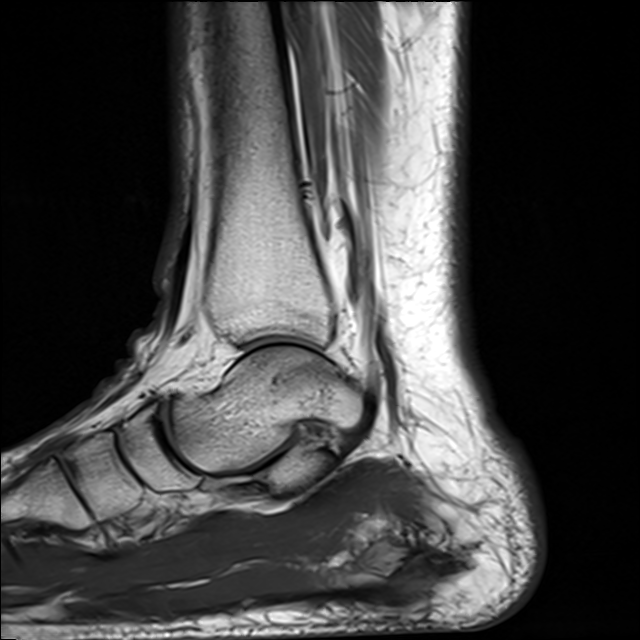
[im 24/24]
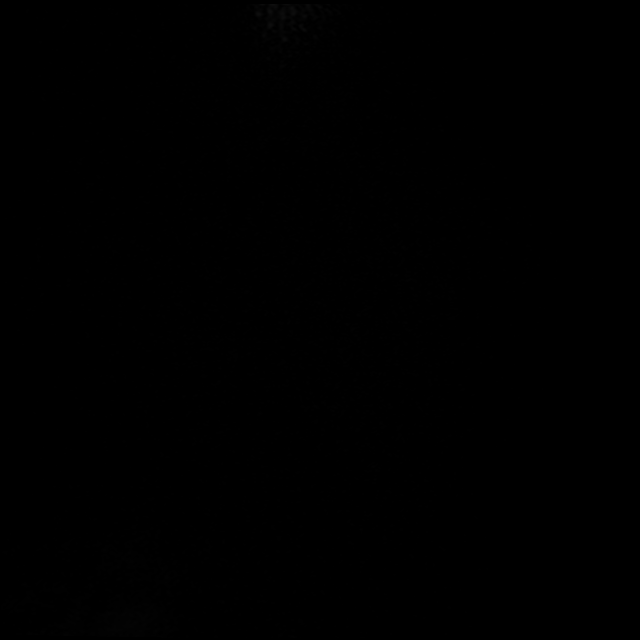

[Series 7: T2 fat-sat · coronal · right · 3.0mm · 0.25mm/px · 3 of 33 slices shown (2 of 2)]
[im 5/33]
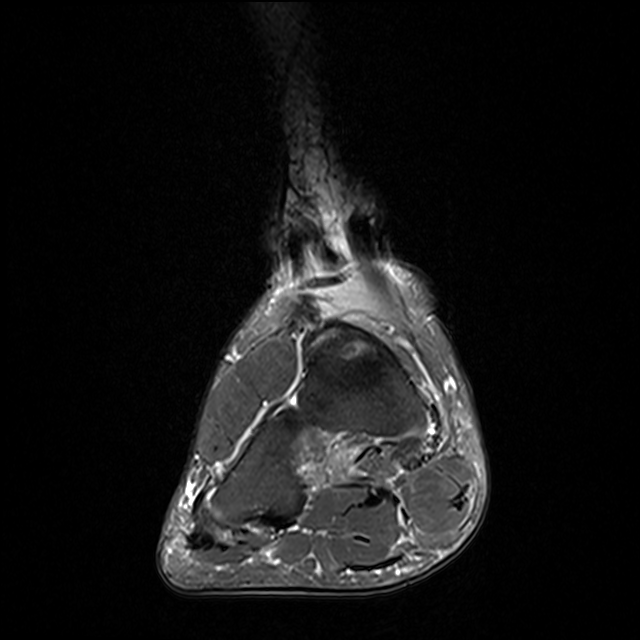
[im 19/33]
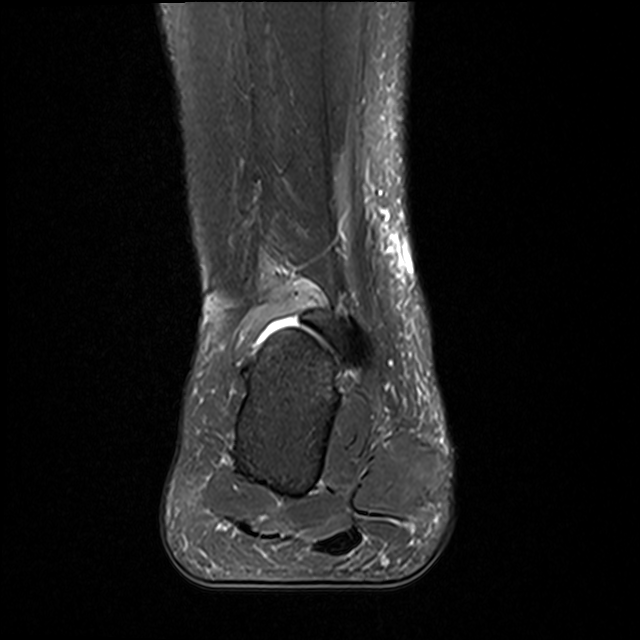
[im 28/33]
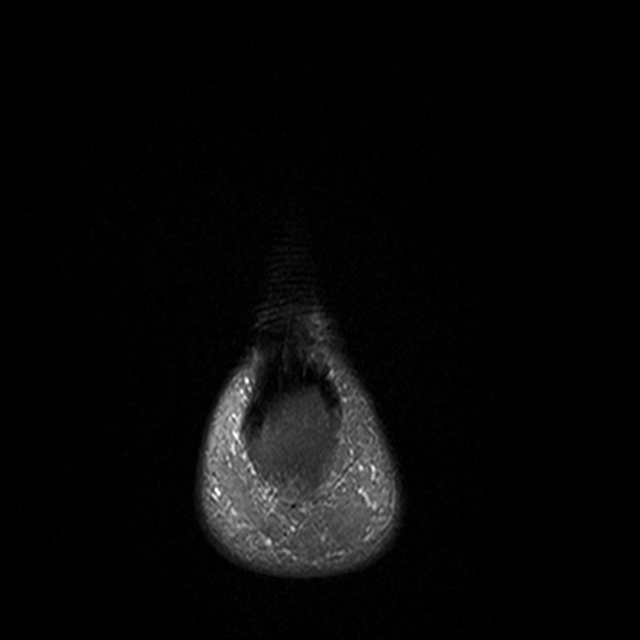

[13 of 40 positions shown; findings below may reference images not displayed]

FINDINGS: TENDONS

Peroneal: Intact. Mild tenosynovitis but no significant tendinopathy
or tear.

Posteromedial: Intact. No significant tendinopathy or tenosynovitis.

Anterior: Intact.  No significant tendinopathy or tenosynovitis.

Achilles: Mild tendinopathy with slight thickening and small
interstitial tears. No complete tear/rupture. Minimal retrocalcaneal
bursitis.

Plantar Fascia: Changes consistent with plantar fasciitis. There is
slight thickening and increased T2 signal intensity in the central
slip near the attachment site. Small interstitial tears are noted.
No complete tear/rupture. Small calcaneal heel spur.

LIGAMENTS

Lateral: Intact

Medial: Intact

CARTILAGE

Ankle Joint: No significant degenerative changes. No cartilage
defects or osteochondral lesion. No joint effusion.

Subtalar Joints/Sinus Tarsi: Joint the subtalar joints are
maintained. Small amount of fluid in the posterior talocalcaneal
facet. The sinus tarsi is unremarkable. The cervical and
interosseous ligaments are intact. The spring ligament is intact.

Bones: Pes planus is noted. Mild degenerative changes at the
talonavicular joint. No findings suspicious for tarsal coalition. No
stress fracture or AVN.

Other: Unremarkable foot and ankle musculature.
IMPRESSION: 1. Changes consistent with plantar fasciitis with small interstitial
tears but no complete tear/rupture.
2. Mild Achilles tendinopathy.
3. Intact anterior, medial and lateral ankle tendons and medial and
lateral ankle ligaments.
4. Pes planus.

## 2021-10-02 ENCOUNTER — Ambulatory Visit (INDEPENDENT_AMBULATORY_CARE_PROVIDER_SITE_OTHER): Payer: 59 | Admitting: Family Medicine

## 2021-10-02 VITALS — BP 144/110 | HR 89 | Temp 98.7°F | Ht 62.5 in | Wt 215.0 lb

## 2021-10-02 DIAGNOSIS — R109 Unspecified abdominal pain: Secondary | ICD-10-CM | POA: Diagnosis not present

## 2021-10-02 DIAGNOSIS — I10 Essential (primary) hypertension: Secondary | ICD-10-CM

## 2021-10-02 DIAGNOSIS — Z0001 Encounter for general adult medical examination with abnormal findings: Secondary | ICD-10-CM

## 2021-10-02 DIAGNOSIS — Z0000 Encounter for general adult medical examination without abnormal findings: Secondary | ICD-10-CM

## 2021-10-02 DIAGNOSIS — L732 Hidradenitis suppurativa: Secondary | ICD-10-CM | POA: Diagnosis not present

## 2021-10-02 DIAGNOSIS — Z1211 Encounter for screening for malignant neoplasm of colon: Secondary | ICD-10-CM

## 2021-10-02 LAB — CBC WITH DIFFERENTIAL/PLATELET
Basophils Absolute: 0 10*3/uL (ref 0.0–0.1)
Basophils Relative: 0.2 % (ref 0.0–3.0)
Eosinophils Absolute: 0.2 10*3/uL (ref 0.0–0.7)
Eosinophils Relative: 3.5 % (ref 0.0–5.0)
HCT: 41.9 % (ref 36.0–46.0)
Hemoglobin: 14 g/dL (ref 12.0–15.0)
Lymphocytes Relative: 26.6 % (ref 12.0–46.0)
Lymphs Abs: 1.6 10*3/uL (ref 0.7–4.0)
MCHC: 33.4 g/dL (ref 30.0–36.0)
MCV: 91.1 fl (ref 78.0–100.0)
Monocytes Absolute: 0.3 10*3/uL (ref 0.1–1.0)
Monocytes Relative: 4.2 % (ref 3.0–12.0)
Neutro Abs: 4 10*3/uL (ref 1.4–7.7)
Neutrophils Relative %: 65.5 % (ref 43.0–77.0)
Platelets: 181 10*3/uL (ref 150.0–400.0)
RBC: 4.6 Mil/uL (ref 3.87–5.11)
RDW: 13.4 % (ref 11.5–15.5)
WBC: 6 10*3/uL (ref 4.0–10.5)

## 2021-10-02 LAB — BASIC METABOLIC PANEL
BUN: 14 mg/dL (ref 6–23)
CO2: 25 mEq/L (ref 19–32)
Calcium: 9.2 mg/dL (ref 8.4–10.5)
Chloride: 105 mEq/L (ref 96–112)
Creatinine, Ser: 0.87 mg/dL (ref 0.40–1.20)
GFR: 78.03 mL/min (ref >60.00)
Glucose, Bld: 94 mg/dL (ref 70–99)
Potassium: 3.9 mEq/L (ref 3.5–5.1)
Sodium: 139 mEq/L (ref 135–145)

## 2021-10-02 LAB — LIPID PANEL
Cholesterol: 182 mg/dL (ref 0–200)
HDL: 45.5 mg/dL (ref >39.00)
LDL Cholesterol: 112 mg/dL — ABNORMAL HIGH (ref 0–99)
NonHDL: 136.54
Total CHOL/HDL Ratio: 4
Triglycerides: 122 mg/dL (ref 0.0–149.0)
VLDL: 24.4 mg/dL (ref 0.0–40.0)

## 2021-10-02 LAB — T4, FREE: Free T4: 0.88 ng/dL (ref 0.60–1.60)

## 2021-10-02 LAB — TSH: TSH: 1.69 u[IU]/mL (ref 0.35–5.50)

## 2021-10-02 LAB — HEMOGLOBIN A1C: Hgb A1c MFr Bld: 5 % (ref 4.6–6.5)

## 2021-10-02 MED ORDER — AMOXICILLIN-POT CLAVULANATE 875-125 MG PO TABS: 1.0000 | ORAL_TABLET | Freq: Two times a day (BID) | ORAL | 0 refills | Status: DC

## 2021-10-02 MED ORDER — SPIRONOLACTONE 25 MG PO TABS: 12.5000 mg | ORAL_TABLET | Freq: Every day | ORAL | 3 refills | Status: DC

## 2021-10-02 MED ORDER — HYDROCHLOROTHIAZIDE 12.5 MG PO TABS: 12.5000 mg | ORAL_TABLET | Freq: Every day | ORAL | 3 refills | Status: DC

## 2021-10-02 NOTE — Progress Notes (Signed)
Subjective:     Erika Larson is a 51 y.o. female and is here for a comprehensive physical exam. The patient reports previously unable to f/u as lost insurance.  Pt states feeling ok.  BP has been elevated.  Patient endorses dry cough with lisinopril 5 mg.  States blood pressures been increased since having hysterectomy.  Patient also endorses the sensation that hands get hot and feels tight when taking medication.  Was taking irbesartan 150 mg daily.  Patient denies headache, CP, changes in vision.  Patient mentions a bump that has been draining on left breast.  Patient states typically drains when in the shower.  Tender to the touch.  Patient also mentions a tightness in left abdomen.  Denies constipation.  Social History   Socioeconomic History   Marital status: Married    Spouse name: Not on file   Number of children: 4   Years of education: Not on file   Highest education level: Not on file  Occupational History   Occupation: Psychologist, forensic  Tobacco Use   Smoking status: Every Day    Packs/day: 0.25    Years: 2.00    Total pack years: 0.50    Types: Cigarettes    Last attempt to quit: 02/02/2019    Years since quitting: 2.6   Smokeless tobacco: Never  Vaping Use   Vaping Use: Never used  Substance and Sexual Activity   Alcohol use: Yes    Comment: weekends   Drug use: No   Sexual activity: Yes    Birth control/protection: None, I.U.D.  Other Topics Concern   Not on file  Social History Narrative   Not on file   Social Determinants of Health   Financial Resource Strain: Not on file  Food Insecurity: Not on file  Transportation Needs: Not on file  Physical Activity: Not on file  Stress: Not on file  Social Connections: Not on file  Intimate Partner Violence: Not on file   Health Maintenance  Topic Date Due   HIV Screening  Never done   TETANUS/TDAP  Never done   COLONOSCOPY (Pts 45-66yr Insurance coverage will need to be confirmed)  Never done    COVID-19 Vaccine (3 - Moderna series) 10/18/2021 (Originally 11/10/2019)   PAP SMEAR-Modifier  06/10/2023 (Originally 08/04/2019)   INFLUENZA VACCINE  10/31/2021   Hepatitis C Screening  Completed   HPV VACCINES  Aged Out    The following portions of the patient's history were reviewed and updated as appropriate: allergies, current medications, past family history, past medical history, past social history, past surgical history, and problem list.  Review of Systems Pertinent items noted in HPI and remainder of comprehensive ROS otherwise negative.   Objective:    BP (!) 144/110 (BP Location: Right Arm, Patient Position: Sitting, Cuff Size: Normal)   Pulse 89   Temp 98.7 F (37.1 C) (Oral)   Ht 5' 2.5" (1.588 m)   Wt 215 lb (97.5 kg)   LMP 02/23/2019   SpO2 96%   BMI 38.70 kg/m  General appearance: alert, cooperative, and no distress Head: Normocephalic, without obvious abnormality, atraumatic Eyes: conjunctivae/corneas clear. PERRL, EOM's intact. Fundi benign. Ears: normal TM's and external ear canals both ears Nose: Nares normal. Septum midline. Mucosa normal. No drainage or sinus tenderness. Throat: lips, mucosa, and tongue normal; teeth and gums normal Neck: no adenopathy, no carotid bruit, no JVD, supple, symmetrical, trachea midline, and thyroid not enlarged, symmetric, no tenderness/mass/nodules Lungs: clear to auscultation bilaterally Heart:  regular rate and rhythm, S1, S2 normal, no murmur, click, rub or gallop Abdomen: soft, non-tender; bowel sounds normal; no masses,  no organomegaly Extremities: extremities normal, atraumatic, no cyanosis or edema Pulses: 2+ and symmetric Skin:  Warm, dry, intact.  Left medial lower breast with 2 fluctuant and draining abscesses another area of fluctuance further medial without drainage.  Areas with TTP and mild erythema. Lymph nodes: Cervical, supraclavicular, and axillary nodes normal. Neurologic: Alert and oriented X 3, normal  strength and tone. Normal symmetric reflexes. Normal coordination and gait    Assessment:    Healthy female exam with uncontrolled HTN/hypertensive urgency, abscesses of left breast   Plan:    Anticipatory guidance given including wearing seatbelts, smoke detectors in the home, increasing physical activity, increasing p.o. intake of water and vegetables. -Obtain labs -Mammogram to be scheduled but pt -Pt status post hysterectomy.  Discussed pelvic exam to assess for changes in vaginal cuff. -Immunizations reviewed.  Patient declines at this time. -Given handout -Next CPE in 1 year See After Visit Summary for Counseling Recommendations  - Plan: CBC with Differential/Platelet, Basic metabolic panel, TSH, T4, Free, Hemoglobin A1c, Lipid panel  Essential hypertension -Uncontrolled -Restart medication.  We will have patient take spironolactone 12.5 mg daily then increase to 25 mg daily. -Also start HCTZ 12.5 mg daily to help with current edema -Patient advised to monitor BP at home and keep a log to bring with her to clinic -Discussed the importance of hydration - Plan: spironolactone (ALDACTONE) 25 MG tablet, hydrochlorothiazide (HYDRODIURIL) 12.5 MG tablet, Basic metabolic panel, Lipid panel  Hidradenitis suppurativa  -Patient to apply warm compresses to promote drainage -Advised more medial abscess likely to need I&D.  We will have patient return later this week for I&D -Start Augmentin -Given strict precautions - Plan: CBC with Differential/Platelet, amoxicillin-clavulanate (AUGMENTIN) 875-125 MG tablet  Left sided abdominal pain -Discussed possible causes including constipation, diverticuitis, GERD -Discussed lifestyle modifications.   - Plan: CBC with Differential/Platelet, Basic metabolic panel, TSH, T4, Free  Colon cancer screening - Plan: Ambulatory referral to Gastroenterology  Follow-up in the next few days for I&D of abscesses/cyst  Grier Mitts, MD

## 2021-10-02 NOTE — Patient Instructions (Addendum)
A prescription for hydrochlorothiazide 12.5 mg and spironolactone 12.5 mg was sent to your pharmacy.  These are both for blood pressure.  The spironolactone does not come in at 12.5 mg tablets we will need to break it in half.  Is important that you monitor your blood pressure at home and keep a log to bring with you to clinic.  The hydrochlorothiazide will likely help relieve some of the tightness in your hands as it helps reduce edema (swelling) by making you urinate a little bit more.  It would be important that you are drinking extra water throughout the day.  A prescription for Augmentin and antibiotic was sent to your pharmacy to help with the cyst.  Apply warm compresses a few times per day to see if the areas will drain better.  If not they need to be drained in clinic.  Pt schedule a mammogram to screen for breast cancer.  You can contact the breast imaging center at [place] at [phone].

## 2021-10-04 ENCOUNTER — Telehealth: Payer: Self-pay | Admitting: Family Medicine

## 2021-10-04 NOTE — Telephone Encounter (Signed)
Attempted to reach pt. Voicemail is full. Patient is take the new prescription that was sent in for her on 7/3.

## 2021-10-04 NOTE — Telephone Encounter (Signed)
Pt is calling and seen dr banks on 10-02-2021 and was put on new bp med and would like to know if she should stop taking the old bp med. Please advise

## 2021-10-05 ENCOUNTER — Ambulatory Visit (INDEPENDENT_AMBULATORY_CARE_PROVIDER_SITE_OTHER): Payer: 59 | Admitting: Family Medicine

## 2021-10-05 ENCOUNTER — Encounter: Payer: Self-pay | Admitting: Family Medicine

## 2021-10-05 VITALS — BP 156/112 | HR 108 | Temp 98.8°F | Wt 214.4 lb

## 2021-10-05 DIAGNOSIS — I10 Essential (primary) hypertension: Secondary | ICD-10-CM

## 2021-10-05 DIAGNOSIS — L0291 Cutaneous abscess, unspecified: Secondary | ICD-10-CM | POA: Diagnosis not present

## 2021-10-05 DIAGNOSIS — L732 Hidradenitis suppurativa: Secondary | ICD-10-CM

## 2021-10-05 NOTE — Progress Notes (Signed)
Subjective:    Patient ID: Erika Larson, female    DOB: [birth year], 51 y.o.   MRN: [id number]  Chief Complaint  Patient presents with   Follow-up    3 days follow up on cyst.     HPI Patient was seen today for I&D of several abscesses on left breast.  Patient started Augmentin 10/02/2021.  Notes bloody drainage from 2 bumps in the area since starting abx, but still with some tenderness.  Patient denies fever, chills, nausea, vomiting.  Pt just started taking spironolactone 12.5 mg daily this wk.  Has been unable to find BP cuff at home to monitor blood pressure.  Denies HAs, changes in vision, CP.  Past Medical History:  Diagnosis Date   Anemia    Arthritis    Gallstones 1998   Hypertension     Allergies  Allergen Reactions   Shellfish Allergy Anaphylaxis and Swelling   Lisinopril     Dry cough    ROS General: Denies fever, chills, night sweats, changes in weight, changes in appetite HEENT: Denies headaches, ear pain, changes in vision, rhinorrhea, sore throat CV: Denies CP, palpitations, SOB, orthopnea Pulm: Denies SOB, cough, wheezing GI: Denies abdominal pain, nausea, vomiting, diarrhea, constipation GU: Denies dysuria, hematuria, frequency, vaginal discharge Msk: Denies muscle cramps, joint pains Neuro: Denies weakness, numbness, tingling Skin: Denies rashes, bruising  +abscesses of left breast Psych: Denies depression, anxiety, hallucinations     Objective:    Blood pressure (!) 156/112, pulse (!) 108, temperature 98.8 F (37.1 C), temperature source Oral, weight 214 lb 6.4 oz (97.3 kg), last menstrual period 02/23/2019, SpO2 95 %.  Gen. Pleasant, well-nourished, in no distress, normal affect   HEENT: [place]/AT, face symmetric, conjunctiva clear, no scleral icterus, PERRLA, EOMI, nares patent without drainage Lungs: no accessory muscle use, CTAB, no wheezes or rales Cardiovascular: RRR, no m/r/g, no peripheral edema Musculoskeletal: No deformities, no  cyanosis or clubbing, normal tone Neuro:  A&Ox3, CN II-XII intact, normal gait Skin:  Warm, dry, intact.  Left medial breast with 3 raised abscess, 2 minimally draining with induration between   Wt Readings from Last 3 Encounters:  10/05/21 214 lb 6.4 oz (97.3 kg)  10/02/21 215 lb (97.5 kg)  07/07/20 223 lb 9.6 oz (101.4 kg)    Lab Results  Component Value Date   WBC 6.0 10/02/2021   HGB 14.0 10/02/2021   HCT 41.9 10/02/2021   PLT 181.0 10/02/2021   GLUCOSE 94 10/02/2021   CHOL 182 10/02/2021   TRIG 122.0 10/02/2021   HDL 45.50 10/02/2021   LDLCALC 112 (H) 10/02/2021   ALT 22 12/16/2019   AST 24 12/16/2019   NA 139 10/02/2021   K 3.9 10/02/2021   CL 105 10/02/2021   CREATININE 0.87 10/02/2021   BUN 14 10/02/2021   CO2 25 10/02/2021   TSH 1.69 10/02/2021   INR 0.99 08/06/2011   HGBA1C 5.0 10/02/2021    Incision and Drainage Procedure Note  Pre-operative Diagnosis: abscess x3  Post-operative Diagnosis: same  Indications: abscess with incomplete drainage  Anesthesia: 1% lidocaine with epinephrine, 3.5 ml  Procedure Details  The procedure, risks and complications have been discussed in detail (including, but not limited to airway compromise, infection, bleeding) with the patient, and the patient has signed consent to the procedure.  The skin was sterilely prepped and draped over the affected area in the usual fashion. After adequate local anesthesia, I&D with a #11 blade was performed on the left, medial lower breast. Purulent  drainage: present The patient was observed until stable.  Findings:  Abscess with scant thin purulent drainage  EBL: minimal cc's  Drains: none   Condition: Tolerated procedure well and Stable  Complications: none.  Assessment/Plan:  Hidradenitis suppurativa -Consider antibacterial soap/body wash such as Dial or Hibiclens. -Given handout  Abscess -Consent obtained.  I&D performed.  Patient tolerated procedure well. -Continue  Augmentin -warm compresses to promote drainage. -Tylenol if needed for discomfort. -given precautions.  Essential hypertension -uncontrolled -Continue spironolactone 12.5 mg daily as just started few days ago. -Continue hydrochlorothiazide 12.5 mg daily. -Patient to locate BP cuff at home to monitor blood pressure.  Discussed increasing spironolactone dose to 25 mg next week for continued elevation. -Continue lifestyle modifications -We will have patient follow-up in 3-4 weeks for BP.  F/u 3-4 weeks for HTN.  F/u as needed for abscesses.  Grier Mitts, MD

## 2021-10-05 NOTE — Patient Instructions (Signed)
Continue antibiotic

## 2021-10-13 ENCOUNTER — Encounter: Payer: Self-pay | Admitting: Family Medicine

## 2022-04-23 ENCOUNTER — Encounter: Payer: Self-pay | Admitting: Family Medicine

## 2022-04-23 ENCOUNTER — Encounter: Payer: No Typology Code available for payment source | Admitting: Family Medicine

## 2022-04-23 ENCOUNTER — Ambulatory Visit (INDEPENDENT_AMBULATORY_CARE_PROVIDER_SITE_OTHER): Payer: No Typology Code available for payment source | Admitting: Family Medicine

## 2022-04-23 VITALS — BP 122/87 | HR 90 | Temp 98.6°F | Ht 62.5 in | Wt 210.0 lb

## 2022-04-23 DIAGNOSIS — I10 Essential (primary) hypertension: Secondary | ICD-10-CM | POA: Diagnosis not present

## 2022-04-23 DIAGNOSIS — E7841 Elevated Lipoprotein(a): Secondary | ICD-10-CM

## 2022-04-23 DIAGNOSIS — Z0000 Encounter for general adult medical examination without abnormal findings: Secondary | ICD-10-CM | POA: Diagnosis not present

## 2022-04-23 DIAGNOSIS — H1033 Unspecified acute conjunctivitis, bilateral: Secondary | ICD-10-CM

## 2022-04-23 DIAGNOSIS — R5383 Other fatigue: Secondary | ICD-10-CM | POA: Diagnosis not present

## 2022-04-23 DIAGNOSIS — Z1231 Encounter for screening mammogram for malignant neoplasm of breast: Secondary | ICD-10-CM

## 2022-04-23 DIAGNOSIS — Z1211 Encounter for screening for malignant neoplasm of colon: Secondary | ICD-10-CM

## 2022-04-23 LAB — COMPREHENSIVE METABOLIC PANEL
ALT: 13 U/L (ref 0–35)
AST: 14 U/L (ref 0–37)
Albumin: 4.1 g/dL (ref 3.5–5.2)
Alkaline Phosphatase: 79 U/L (ref 39–117)
BUN: 15 mg/dL (ref 6–23)
CO2: 30 mEq/L (ref 19–32)
Calcium: 9.1 mg/dL (ref 8.4–10.5)
Chloride: 103 mEq/L (ref 96–112)
Creatinine, Ser: 0.83 mg/dL (ref 0.40–1.20)
GFR: 82.25 mL/min (ref >60.00)
Glucose, Bld: 84 mg/dL (ref 70–99)
Potassium: 3.7 mEq/L (ref 3.5–5.1)
Sodium: 141 mEq/L (ref 135–145)
Total Bilirubin: 0.5 mg/dL (ref 0.2–1.2)
Total Protein: 7.7 g/dL (ref 6.0–8.3)

## 2022-04-23 LAB — LIPID PANEL
Cholesterol: 167 mg/dL (ref 0–200)
HDL: 37.4 mg/dL — ABNORMAL LOW (ref >39.00)
LDL Cholesterol: 97 mg/dL (ref 0–99)
NonHDL: 129.89
Total CHOL/HDL Ratio: 4
Triglycerides: 165 mg/dL — ABNORMAL HIGH (ref 0.0–149.0)
VLDL: 33 mg/dL (ref 0.0–40.0)

## 2022-04-23 LAB — CBC WITH DIFFERENTIAL/PLATELET
Basophils Absolute: 0 10*3/uL (ref 0.0–0.1)
Basophils Relative: 0.5 % (ref 0.0–3.0)
Eosinophils Absolute: 0.2 10*3/uL (ref 0.0–0.7)
Eosinophils Relative: 3 % (ref 0.0–5.0)
HCT: 42 % (ref 36.0–46.0)
Hemoglobin: 13.9 g/dL (ref 12.0–15.0)
Lymphocytes Relative: 28.8 % (ref 12.0–46.0)
Lymphs Abs: 2 10*3/uL (ref 0.7–4.0)
MCHC: 33 g/dL (ref 30.0–36.0)
MCV: 91.2 fl (ref 78.0–100.0)
Monocytes Absolute: 0.4 10*3/uL (ref 0.1–1.0)
Monocytes Relative: 5.2 % (ref 3.0–12.0)
Neutro Abs: 4.3 10*3/uL (ref 1.4–7.7)
Neutrophils Relative %: 62.5 % (ref 43.0–77.0)
Platelets: 186 10*3/uL (ref 150.0–400.0)
RBC: 4.61 Mil/uL (ref 3.87–5.11)
RDW: 13.4 % (ref 11.5–15.5)
WBC: 6.9 10*3/uL (ref 4.0–10.5)

## 2022-04-23 LAB — TSH: TSH: 1.82 u[IU]/mL (ref 0.35–5.50)

## 2022-04-23 LAB — HEMOGLOBIN A1C: Hgb A1c MFr Bld: 5.3 % (ref 4.6–6.5)

## 2022-04-23 LAB — VITAMIN D 25 HYDROXY (VIT D DEFICIENCY, FRACTURES): VITD: 43.69 ng/mL (ref 30.00–100.00)

## 2022-04-23 MED ORDER — POLYMYXIN B-TRIMETHOPRIM 10000-0.1 UNIT/ML-% OP SOLN: 1.0000 [drp] | OPHTHALMIC | 0 refills | Status: DC

## 2022-04-23 NOTE — Progress Notes (Signed)
Established Patient Office Visit   Subjective  Patient ID: Erika Larson, female    DOB: [birth year]  Age: 52 y.o. MRN: [id number]  Chief Complaint  Patient presents with   Annual Exam    Pt seen for CPE.  Doing well overall.  BP has been controlled on current meds.  States changing insurance makes BP meds cheaper at [facility] and [facility].  Trying to get better control of her health.  Patient states her eyes have been red, itchy, draining with dried crusty debris in the last few days.  Patient states she is around her grandchild who had pinkeye.  Patient endorses fatigue.  Occasionally has been told she snores.  Wakes up feeling unrested.  Often has difficulty sleeping.      Review of Systems  Eyes:  Positive for discharge and redness.   Negative unless stated above    Objective:     BP 122/87 (BP Location: Right Arm, Patient Position: Sitting, Cuff Size: Large)   Pulse 90   Temp 98.6 F (37 C) (Oral)   Ht 5' 2.5" (1.588 m)   Wt 210 lb (95.3 kg)   LMP 02/23/2019   SpO2 98%   BMI 37.80 kg/m    Physical Exam Constitutional:      Appearance: Normal appearance.  HENT:     Head: Normocephalic and atraumatic.     Right Ear: Tympanic membrane, ear canal and external ear normal.     Left Ear: Tympanic membrane, ear canal and external ear normal.     Nose: Nose normal.     Mouth/Throat:     Mouth: Mucous membranes are moist.     Pharynx: No oropharyngeal exudate or posterior oropharyngeal erythema.  Eyes:     General: No scleral icterus.    Extraocular Movements: Extraocular movements intact.     Conjunctiva/sclera: Conjunctivae normal.     Pupils: Pupils are equal, round, and reactive to light.  Neck:     Thyroid: No thyromegaly.  Cardiovascular:     Rate and Rhythm: Normal rate and regular rhythm.     Pulses: Normal pulses.     Heart sounds: Normal heart sounds. No murmur heard.    No friction rub.  Pulmonary:     Effort: Pulmonary effort is normal.      Breath sounds: Normal breath sounds. No wheezing, rhonchi or rales.  Abdominal:     General: Bowel sounds are normal.     Palpations: Abdomen is soft.     Tenderness: There is no abdominal tenderness.  Musculoskeletal:        General: No deformity. Normal range of motion.  Lymphadenopathy:     Cervical: No cervical adenopathy.  Skin:    General: Skin is warm and dry.     Findings: No lesion.  Neurological:     General: No focal deficit present.     Mental Status: She is alert and oriented to person, place, and time.  Psychiatric:        Mood and Affect: Mood normal.        Thought Content: Thought content normal.      No results found for any visits on 04/23/22.    Assessment & Plan:  Well adult exam -Obtain labs -Referral for mammogram placed -Referral for colonoscopy placed -Immunizations reviewed.  Patient declines influenza vaccine and shingles vaccine at this time.  Tdap due 2025.  Essential hypertension -Controlled -Continue hydrochlorothiazide 12.5 mg and spironolactone 12.5 mg daily -  CBC with Differential/Platelet -     Comprehensive metabolic panel  Fatigue, unspecified type -     CBC with Differential/Platelet -     TSH -     Hemoglobin A1c -     Comprehensive metabolic panel -     VITAMIN D 25 Hydroxy (Vit-D Deficiency, Fractures)  Elevated lipoprotein(a) -     Lipid panel -     Comprehensive metabolic panel  Colon cancer screening -     Ambulatory referral to Gastroenterology  Encounter for screening mammogram for malignant neoplasm of breast -     3D Screening Mammogram, Left and Right; Future  Acute bacterial conjunctivitis of both eyes -     Polymyxin B-Trimethoprim; Place 1 drop into both eyes every 4 (four) hours.  Dispense: 10 mL; Refill: 0    Return if symptoms worsen or fail to improve.   Billie Ruddy, MD

## 2022-04-23 NOTE — Patient Instructions (Signed)
A referral for colonoscopy and mammogram were placed.  If you have not heard anything about these in the next week please let the clinic know.    You should be good on refills for blood pressure meds until July.

## 2022-04-27 ENCOUNTER — Telehealth: Payer: Self-pay | Admitting: Family Medicine

## 2022-04-27 NOTE — Telephone Encounter (Signed)
Already spoke with pt

## 2022-04-27 NOTE — Telephone Encounter (Signed)
Pt is returning karpuih concerning blood work result

## 2022-04-30 ENCOUNTER — Encounter: Payer: No Typology Code available for payment source | Admitting: Family Medicine

## 2022-05-31 ENCOUNTER — Ambulatory Visit (AMBULATORY_SURGERY_CENTER): Payer: No Typology Code available for payment source

## 2022-05-31 VITALS — Ht 62.5 in | Wt 208.0 lb

## 2022-05-31 DIAGNOSIS — Z1211 Encounter for screening for malignant neoplasm of colon: Secondary | ICD-10-CM

## 2022-05-31 MED ORDER — NA SULFATE-K SULFATE-MG SULF 17.5-3.13-1.6 GM/177ML PO SOLN: 1.0000 | Freq: Once | ORAL | 0 refills | Status: AC

## 2022-05-31 NOTE — Progress Notes (Signed)
No egg or soy allergy known to patient  No issues known to pt with past sedation with any surgeries or procedures Patient denies ever being told they had issues or difficulty with intubation  No FH of Malignant Hyperthermia Pt is not on diet pills Pt is not on  home 02  Pt is not on blood thinners  Pt report going 2 days - 1 week without having a BM. But past two months has been able to have a BM everyday with no straining or hard stools  No A fib or A flutter Have any cardiac testing pending--no  Pt instructed to use Single[employer]com or GoodRx for a price reduction on prep

## 2022-06-18 ENCOUNTER — Encounter: Payer: No Typology Code available for payment source | Admitting: Gastroenterology

## 2022-06-19 ENCOUNTER — Ambulatory Visit (AMBULATORY_SURGERY_CENTER): Payer: No Typology Code available for payment source | Admitting: *Deleted

## 2022-06-19 VITALS — Ht 64.0 in | Wt 209.0 lb

## 2022-06-19 DIAGNOSIS — Z1211 Encounter for screening for malignant neoplasm of colon: Secondary | ICD-10-CM

## 2022-06-19 NOTE — Progress Notes (Signed)
No egg or soy allergy known to patient  No issues known to pt with past sedation with any surgeries or procedures Patient denies ever being told they had issues or difficulty with intubation  No FH of Malignant Hyperthermia Pt is not on diet pills Pt is not on  home 02  Pt is not on blood thinners  Pt admits issues with constipation,"sometimes",extra miralax given to take with prep. No A fib or A flutter Have any cardiac testing pending--NO Pt instructed to use Single[employer]com or GoodRx for a price reduction on prep    Pt.has suprep at home from previous appointment.  Patient's chart reviewed by Osvaldo Angst CNRA prior to previsit and patient appropriate for the [facility].  Previsit completed and red dot placed by patient's name on their procedure day (on provider's schedule).

## 2022-06-27 ENCOUNTER — Ambulatory Visit: Payer: No Typology Code available for payment source | Admitting: Family Medicine

## 2022-06-28 ENCOUNTER — Encounter: Payer: Self-pay | Admitting: Gastroenterology

## 2022-07-03 ENCOUNTER — Encounter: Payer: Self-pay | Admitting: Gastroenterology

## 2022-07-04 ENCOUNTER — Other Ambulatory Visit: Payer: Self-pay | Admitting: Family Medicine

## 2022-07-04 DIAGNOSIS — I10 Essential (primary) hypertension: Secondary | ICD-10-CM

## 2022-07-18 ENCOUNTER — Ambulatory Visit: Payer: BC Managed Care – PPO | Admitting: Gastroenterology

## 2022-07-18 ENCOUNTER — Encounter: Payer: Self-pay | Admitting: Gastroenterology

## 2022-07-18 VITALS — BP 145/94 | HR 72 | Temp 98.2°F | Resp 18 | Ht 64.0 in | Wt 209.0 lb

## 2022-07-18 DIAGNOSIS — K625 Hemorrhage of anus and rectum: Secondary | ICD-10-CM

## 2022-07-18 DIAGNOSIS — D125 Benign neoplasm of sigmoid colon: Secondary | ICD-10-CM

## 2022-07-18 DIAGNOSIS — K635 Polyp of colon: Secondary | ICD-10-CM | POA: Diagnosis not present

## 2022-07-18 MED ORDER — SODIUM CHLORIDE 0.9 % IV SOLN: 500.0000 mL | Freq: Once | INTRAVENOUS | Status: DC

## 2022-07-18 NOTE — Progress Notes (Signed)
[facility] History and Physical   Primary Care Physician:  Deeann Saint, MD   Reason for Procedure:   Rectal bleeding - first colonoscopy  Plan:    colonoscopy     HPI: Erika Larson is a 52 y.o. female  here for colonoscopy -  first time exam. Seen in 2021, recommended colonoscopy at that time for bleeding. She has been having intermittent rectal bleeding for years. Occasional constipation. No family history of colon cancer known. Otherwise feels well without any cardiopulmonary symptoms.   I have discussed risks / benefits of anesthesia and endoscopic procedure with Erika Larson and they wish to proceed with the exams as outlined today.    Past Medical History:  Diagnosis Date   Allergy    SEASONAL   Anemia    Anxiety    Arthritis    Depression    Gallstones 1998   GERD (gastroesophageal reflux disease)    Hyperlipidemia    Hypertension    Seizures    AS A CHILD,BUT NOT AS A ADULT    Past Surgical History:  Procedure Laterality Date   CHOLECYSTECTOMY     CYSTOSCOPY Bilateral 02/25/2019   Procedure: CYSTOSCOPY;  Surgeon: Rande Brunt, MD;  Location: [facility];  Service: Gynecology;  Laterality: Bilateral;   ROBOTIC ASSISTED LAPAROSCOPIC HYSTERECTOMY AND SALPINGECTOMY Bilateral 02/25/2019   Procedure: XI ROBOTIC ASSISTED LAPAROSCOPIC HYSTERECTOMY AND SALPINGECTOMY;  Surgeon: Rande Brunt, MD;  Location: [facility];  Service: Gynecology;  Laterality: Bilateral;   TUBAL LIGATION     WISDOM TOOTH EXTRACTION      Prior to Admission medications   Medication Sig Start Date End Date Taking? Authorizing Provider  aspirin EC 81 MG tablet Take 81 mg by mouth daily. Swallow whole.   Yes [provider]  hydrochlorothiazide (HYDRODIURIL) 12.5 MG tablet TAKE 1 TABLET BY MOUTH EVERY DAY 07/04/22  Yes Deeann Saint, MD  spironolactone (ALDACTONE) 25 MG tablet Take 0.5 tablets  (12.5 mg total) by mouth daily. 10/02/21  Yes Deeann Saint, MD  amoxicillin (AMOXIL) 500 MG capsule Take 500 mg by mouth every 8 (eight) hours. Patient not taking: Reported on 07/18/2022 07/10/22   [provider]  ibuprofen (ADVIL) 600 MG tablet Take 600 mg by mouth every 6 (six) hours as needed. Patient not taking: Reported on 07/18/2022 07/10/22   [provider]  Iron, Ferrous Sulfate, 325 (65 Fe) MG TABS Take 325 mg by mouth daily.     [provider]  trimethoprim-polymyxin b (POLYTRIM) ophthalmic solution Place 1 drop into both eyes every 4 (four) hours. 04/23/22   Deeann Saint, MD  VITAMIN D PO Take by mouth. TAKE GUMMIES DAILY    [provider]    Current Outpatient Medications  Medication Sig Dispense Refill   aspirin EC 81 MG tablet Take 81 mg by mouth daily. Swallow whole.     hydrochlorothiazide (HYDRODIURIL) 12.5 MG tablet TAKE 1 TABLET BY MOUTH EVERY DAY 90 tablet 2   spironolactone (ALDACTONE) 25 MG tablet Take 0.5 tablets (12.5 mg total) by mouth daily. 90 tablet 3   amoxicillin (AMOXIL) 500 MG capsule Take 500 mg by mouth every 8 (eight) hours. (Patient not taking: Reported on 07/18/2022)     ibuprofen (ADVIL) 600 MG tablet Take 600 mg by mouth every 6 (six) hours as needed. (Patient not taking: Reported on 07/18/2022)     Iron, Ferrous Sulfate, 325 (65 Fe) MG TABS Take 325 mg  by mouth daily.      trimethoprim-polymyxin b (POLYTRIM) ophthalmic solution Place 1 drop into both eyes every 4 (four) hours. 10 mL 0   VITAMIN D PO Take by mouth. TAKE GUMMIES DAILY     Current Facility-Administered Medications  Medication Dose Route Frequency Provider Last Rate Last Admin   0.9 %  sodium chloride infusion  500 mL Intravenous Once Antuane Eastridge, Willaim Rayas, MD        Allergies as of 07/18/2022 - Review Complete 07/18/2022  Allergen Reaction Noted   Shellfish allergy Anaphylaxis and Swelling 08/06/2011   Lisinopril  12/16/2019    Family History   Problem Relation Age of Onset   Stomach cancer Mother    Arthritis Mother    Hearing loss Mother    Hypertension Mother    Stomach cancer Sister    Hypertension Sister    Hypertension Sister    Hypertension Sister    Hypertension Sister    Hypertension Sister    Hypertension Brother    Hypertension Brother    Hypertension Brother    Hypertension Brother    Colon cancer Neg Hx    Colon polyps Neg Hx    Esophageal cancer Neg Hx    Crohn's disease Neg Hx    Rectal cancer Neg Hx    Ulcerative colitis Neg Hx     Social History   Socioeconomic History   Marital status: Married    Spouse name: Not on file   Number of children: 4   Years of education: Not on file   Highest education level: Not on file  Occupational History   Occupation: Editor, commissioning  Tobacco Use   Smoking status: Every Day    Packs/day: .25    Types: Cigarettes   Smokeless tobacco: Never  Vaping Use   Vaping Use: Never used  Substance and Sexual Activity   Alcohol use: Yes    Comment: SOCIALLY   Drug use: No   Sexual activity: Yes    Birth control/protection: None, I.U.D.  Other Topics Concern   Not on file  Social History Narrative   Not on file   Social Determinants of Health   Financial Resource Strain: Not on file  Food Insecurity: Not on file  Transportation Needs: Not on file  Physical Activity: Not on file  Stress: Not on file  Social Connections: Not on file  Intimate Partner Violence: Not on file    Review of Systems: All other review of systems negative except as mentioned in the HPI.  Physical Exam: Vital signs BP (!) 164/108   Pulse 90   Temp 98.2 F (36.8 C)   Resp 12   Ht  (1.626 m)   Wt 209 lb (94.8 kg)   LMP 02/23/2019   SpO2 97%   BMI 35.87 kg/m   General:   Alert,  Well-developed, pleasant and cooperative in NAD Lungs:  Clear throughout to auscultation.   Heart:  Regular rate and rhythm Abdomen:  Soft, nontender and nondistended.   Neuro/Psych:   Alert and cooperative. Normal mood and affect. A and O x 3  Harlin Rain, MD [facility]

## 2022-07-18 NOTE — Op Note (Signed)
[facility] Patient Name: Erika Larson Procedure Date: 07/18/2022 8:48 AM MRN: [id number] Endoscopist: Viviann Spare P. Adela Lank , MD, [id number] Age: 52 Referring MD:  Date of Birth: Apr 16, 1970 Gender: Female Account #: [id number] Procedure:                Colonoscopy Indications:              This is the patient's first colonoscopy, Rectal                            bleeding Medicines:                Monitored Anesthesia Care Procedure:                Pre-Anesthesia Assessment:                           - Prior to the procedure, a History and Physical                            was performed, and patient medications and                            allergies were reviewed. The patient's tolerance of                            previous anesthesia was also reviewed. The risks                            and benefits of the procedure and the sedation                            options and risks were discussed with the patient.                            All questions were answered, and informed consent                            was obtained. Prior Anticoagulants: The patient has                            taken no anticoagulant or antiplatelet agents. ASA                            Grade Assessment: II - A patient with mild systemic                            disease. After reviewing the risks and benefits,                            the patient was deemed in satisfactory condition to                            undergo the procedure.  After obtaining informed consent, the colonoscope                            was passed under direct vision. Throughout the                            procedure, the patient's blood pressure, pulse, and                            oxygen saturations were monitored continuously. The                            CF [id number] #[id number] was introduced through the anus                            and advanced to the the terminal ileum,  with                            identification of the appendiceal orifice and IC                            valve. The colonoscopy was performed without                            difficulty. The patient tolerated the procedure                            well. The quality of the bowel preparation was                            adequate. The terminal ileum, ileocecal valve,                            appendiceal orifice, and rectum were photographed. Scope In: 9:04:08 AM Scope Out: 9:21:08 AM Scope Withdrawal Time: 0 hours 13 minutes 28 seconds  Total Procedure Duration: 0 hours 17 minutes 0 seconds  Findings:                 The perianal and digital rectal examinations were                            normal.                           The terminal ileum appeared normal.                           A 3 to 4 mm polyp was found in the sigmoid colon.                            The polyp was sessile. The polyp was removed with a                            cold snare. Resection and retrieval were complete.  Internal hemorrhoids were found during                            retroflexion. The hemorrhoids were moderate.                           The exam was otherwise without abnormality. Complications:            No immediate complications. Estimated blood loss:                            Minimal. Estimated Blood Loss:     Estimated blood loss was minimal. Impression:               - The examined portion of the ileum was normal.                           - One 3 to 4 mm polyp in the sigmoid colon, removed                            with a cold snare. Resected and retrieved.                           - Internal hemorrhoids.                           - The examination was otherwise normal.                           Hemorrhoids are the cause of bleeding symptoms.                           - The GI Genius (intelligent endoscopy module),                            computer-aided  polyp detection system powered by AI                            was utilized to detect colorectal polyps through                            enhanced visualization during colonoscopy. Recommendation:           - Patient has a contact number available for                            emergencies. The signs and symptoms of potential                            delayed complications were discussed with the                            patient. Return to normal activities tomorrow.                            Written discharge instructions were  provided to the                            patient.                           - Resume previous diet.                           - Continue present medications.                           - Recommend daily fiber supplement to keep stools                            soft                           - Await pathology results.                           - Consideration for hemorrhoid banding if more                            definitive treatment is needed / desired for                            hemorrhoids. Will discuss with the patient Erika Larson. Erika Villavicencio, MD 07/18/2022 9:26:51 AM This report has been signed electronically.

## 2022-07-18 NOTE — Telephone Encounter (Signed)
-----   Message from Benancio Deeds, MD sent at 07/18/2022  3:57 PM EDT ----- Regarding: banding Jan can you help schedule this patient for routine hemorrhoid banding? Thanks

## 2022-07-18 NOTE — Progress Notes (Signed)
Uneventful anesthetic. Report to pacu rn. Vss. Care resumed by rn. 

## 2022-07-18 NOTE — Patient Instructions (Addendum)
Handout on hemorrhoids and polyps given to patient. Consider hemorrhoid banding if more definitive treatment is needed/desired for hemorrhoids (handout given) Await pathology results Resume previous diet and continue present medications - recommended to use a daily fiber supplement to keep stools soft Repeat colonoscopy for surveillance will be determined based off of pathology results    YOU HAD AN ENDOSCOPIC PROCEDURE TODAY AT THE [facility]:   Refer to the procedure report that was given to you for any specific questions about what was found during the examination.  If the procedure report does not answer your questions, please call your gastroenterologist to clarify.  If you requested that your care partner not be given the details of your procedure findings, then the procedure report has been included in a sealed envelope for you to review at your convenience later.  YOU SHOULD EXPECT: Some feelings of bloating in the abdomen. Passage of more gas than usual.  Walking can help get rid of the air that was put into your GI tract during the procedure and reduce the bloating. If you had a lower endoscopy (such as a colonoscopy or flexible sigmoidoscopy) you may notice spotting of blood in your stool or on the toilet paper. If you underwent a bowel prep for your procedure, you may not have a normal bowel movement for a few days.  Please Note:  You might notice some irritation and congestion in your nose or some drainage.  This is from the oxygen used during your procedure.  There is no need for concern and it should clear up in a day or so.  SYMPTOMS TO REPORT IMMEDIATELY:  Following lower endoscopy (colonoscopy or flexible sigmoidoscopy):  Excessive amounts of blood in the stool  Significant tenderness or worsening of abdominal pains  Swelling of the abdomen that is new, acute  Fever of 100F or higher  For urgent or emergent issues, a gastroenterologist can be reached at any hour  by calling [phone]. Do not use MyChart messaging for urgent concerns.    DIET:  We do recommend a small meal at first, but then you may proceed to your regular diet.  Drink plenty of fluids but you should avoid alcoholic beverages for 24 hours.  ACTIVITY:  You should plan to take it easy for the rest of today and you should NOT DRIVE or use heavy machinery until tomorrow (because of the sedation medicines used during the test).    FOLLOW UP: Our staff will call the number listed on your records the next business day following your procedure.  We will call around 7:15- 8:00 am to check on you and address any questions or concerns that you may have regarding the information given to you following your procedure. If we do not reach you, we will leave a message.     If any biopsies were taken you will be contacted by phone or by letter within the next 1-3 weeks.  Please call us at [phone] if you have not heard about the biopsies in 3 weeks.    SIGNATURES/CONFIDENTIALITY: You and/or your care partner have signed paperwork which will be entered into your electronic medical record.  These signatures attest to the fact that that the information above on your After Visit Summary has been reviewed and is understood.  Full responsibility of the confidentiality of this discharge information lies with you and/or your care-partner.

## 2022-07-18 NOTE — Telephone Encounter (Signed)
Patient has been scheduled for her 1st hemorrhoid banding appointment on Friday, 6-21 at 4pm. MyChart message sent to patient to reschedule if this appointment is not convenient.

## 2022-07-18 NOTE — Progress Notes (Signed)
Called to room to assist during endoscopic procedure.  Patient ID and intended procedure confirmed with present staff. Received instructions for my participation in the procedure from the performing physician.  

## 2022-07-19 ENCOUNTER — Telehealth: Payer: Self-pay

## 2022-07-19 NOTE — Telephone Encounter (Signed)
Attempted follow up phone call; no answer; VM box full; unable to leave VM.

## 2022-07-30 ENCOUNTER — Encounter: Payer: Self-pay | Admitting: Gastroenterology

## 2022-08-22 ENCOUNTER — Ambulatory Visit (INDEPENDENT_AMBULATORY_CARE_PROVIDER_SITE_OTHER): Payer: BC Managed Care – PPO | Admitting: Family Medicine

## 2022-08-22 ENCOUNTER — Encounter: Payer: Self-pay | Admitting: Family Medicine

## 2022-08-22 VITALS — BP 122/84 | HR 94 | Temp 98.3°F | Wt 208.4 lb

## 2022-08-22 DIAGNOSIS — K5900 Constipation, unspecified: Secondary | ICD-10-CM | POA: Diagnosis not present

## 2022-08-22 DIAGNOSIS — K2900 Acute gastritis without bleeding: Secondary | ICD-10-CM | POA: Diagnosis not present

## 2022-08-22 DIAGNOSIS — R1012 Left upper quadrant pain: Secondary | ICD-10-CM

## 2022-08-22 DIAGNOSIS — R519 Headache, unspecified: Secondary | ICD-10-CM | POA: Diagnosis not present

## 2022-08-22 DIAGNOSIS — F5089 Other specified eating disorder: Secondary | ICD-10-CM

## 2022-08-22 DIAGNOSIS — R5383 Other fatigue: Secondary | ICD-10-CM

## 2022-08-22 DIAGNOSIS — R232 Flushing: Secondary | ICD-10-CM | POA: Diagnosis not present

## 2022-08-22 MED ORDER — OMEPRAZOLE 20 MG PO CPDR
20.0000 mg | DELAYED_RELEASE_CAPSULE | Freq: Every day | ORAL | 1 refills | Status: DC
Start: 2022-08-22 — End: 2022-10-22

## 2022-08-22 NOTE — Patient Instructions (Signed)
A prescription for omeprazole was sent to your pharmacy.

## 2022-08-22 NOTE — Progress Notes (Signed)
Established Patient Office Visit   Subjective  Patient ID: Erika Larson, female    DOB: 1970-11-15  Age: 52 y.o. MRN: [id number]  Chief Complaint  Patient presents with   Abdominal Pain    Upper stomach pain, started Saturday. Started feeling sick afte chinese food, pain started after using the restroom and has not left. Was also throwing up. Cold drink helps but pain comes back. Feels like se needs to vomit.     Pt is a 52 yo female seen for acute concern.  Pt developed abdominal pain 10-15 min after eating [place] food 4-5 days ago.  Pain noted as a stinging, burning sensation that has continued.  Also had nausea, decreased appetite, and constipation. Had a BM this am with small pieces of stool.  Denies diarrhea, fever.  Has h/o anemia. Pt eating ice more.  Endorses fatigue.  Having increased hot flashes and HAs.    Past Medical History:  Diagnosis Date   Allergy    SEASONAL   Anemia    Anxiety    Arthritis    Depression    Gallstones 1998   GERD (gastroesophageal reflux disease)    Hyperlipidemia    Hypertension    Seizures (HCC)    AS A CHILD,BUT NOT AS A ADULT   Past Surgical History:  Procedure Laterality Date   CHOLECYSTECTOMY     CYSTOSCOPY Bilateral 02/25/2019   Procedure: CYSTOSCOPY;  Surgeon: Rande Brunt, MD;  Location: [facility];  Service: Gynecology;  Laterality: Bilateral;   ROBOTIC ASSISTED LAPAROSCOPIC HYSTERECTOMY AND SALPINGECTOMY Bilateral 02/25/2019   Procedure: XI ROBOTIC ASSISTED LAPAROSCOPIC HYSTERECTOMY AND SALPINGECTOMY;  Surgeon: Rande Brunt, MD;  Location: [facility];  Service: Gynecology;  Laterality: Bilateral;   TUBAL LIGATION     WISDOM TOOTH EXTRACTION     Social History   Tobacco Use   Smoking status: Every Day    Packs/day: .25    Types: Cigarettes   Smokeless tobacco: Never  Vaping Use   Vaping Use: Never used  Substance Use Topics   Alcohol use: Yes     Comment: SOCIALLY   Drug use: No   Family History  Problem Relation Age of Onset   Stomach cancer Mother    Arthritis Mother    Hearing loss Mother    Hypertension Mother    Stomach cancer Sister    Hypertension Sister    Hypertension Sister    Hypertension Sister    Hypertension Sister    Hypertension Sister    Hypertension Brother    Hypertension Brother    Hypertension Brother    Hypertension Brother    Colon cancer Neg Hx    Colon polyps Neg Hx    Esophageal cancer Neg Hx    Crohn's disease Neg Hx    Rectal cancer Neg Hx    Ulcerative colitis Neg Hx    Allergies  Allergen Reactions   Shellfish Allergy Anaphylaxis and Swelling   Lisinopril     Dry cough      ROS Negative unless stated above    Objective:     BP 122/84 (BP Location: Right Arm, Patient Position: Sitting, Cuff Size: Large)   Pulse 94   Temp 98.3 F (36.8 C) (Oral)   Wt 208 lb 6.4 oz (94.5 kg)   LMP 02/23/2019   SpO2 96%   BMI 35.77 kg/m    Physical Exam Constitutional:      General: She is not in acute  distress.    Appearance: Normal appearance.  HENT:     Head: Normocephalic and atraumatic.     Nose: Nose normal.     Mouth/Throat:     Mouth: Mucous membranes are moist.  Cardiovascular:     Rate and Rhythm: Normal rate and regular rhythm.     Heart sounds: Normal heart sounds. No murmur heard.    No gallop.  Pulmonary:     Effort: Pulmonary effort is normal. No respiratory distress.     Breath sounds: Normal breath sounds. No wheezing, rhonchi or rales.  Abdominal:     General: Bowel sounds are normal.     Palpations: Abdomen is soft.     Tenderness: There is no abdominal tenderness.  Skin:    General: Skin is warm and dry.  Neurological:     Mental Status: She is alert and oriented to person, place, and time.      No results found for any visits on 08/22/22.    Assessment & Plan:  LUQ abdominal pain -     CBC with Differential/Platelet  Acute gastritis without  hemorrhage, unspecified gastritis type -     CBC with Differential/Platelet -     Omeprazole; Take 1 capsule (20 mg total) by mouth daily.  Dispense: 30 capsule; Refill: 1  Constipation, unspecified constipation type -     Comprehensive metabolic panel -     TSH  Hot flashes  Persistent headaches -     Comprehensive metabolic panel  Fatigue, unspecified type -     Iron, TIBC and Ferritin Panel -     CBC with Differential/Platelet  Pica -     Iron, TIBC and Ferritin Panel -     CBC with Differential/Platelet   Acute gastritis likely contributing to LUQ pain.  Start omeprazole.  Patient advised to avoid spicy or greasy foods.  Bland diet, advance as tolerated.  Daily bowel regimen for constipation.  Evaluate for anemia due to increased fatigue.  Hot flashes likely 2/2 menopause.  Follow-up with OB/GYN encouraged.  Return if symptoms worsen or fail to improve.   Deeann Saint, MD

## 2022-08-23 LAB — CBC WITH DIFFERENTIAL/PLATELET
Basophils Absolute: 0.1 10*3/uL (ref 0.0–0.1)
Basophils Relative: 1.1 % (ref 0.0–3.0)
Eosinophils Absolute: 0.3 10*3/uL (ref 0.0–0.7)
Eosinophils Relative: 3.4 % (ref 0.0–5.0)
HCT: 42.4 % (ref 36.0–46.0)
Hemoglobin: 14.1 g/dL (ref 12.0–15.0)
Lymphocytes Relative: 30.1 % (ref 12.0–46.0)
Lymphs Abs: 2.3 10*3/uL (ref 0.7–4.0)
MCHC: 33.2 g/dL (ref 30.0–36.0)
MCV: 91 fl (ref 78.0–100.0)
Monocytes Absolute: 0.4 10*3/uL (ref 0.1–1.0)
Monocytes Relative: 5.2 % (ref 3.0–12.0)
Neutro Abs: 4.6 10*3/uL (ref 1.4–7.7)
Neutrophils Relative %: 60.2 % (ref 43.0–77.0)
Platelets: 199 10*3/uL (ref 150.0–400.0)
RBC: 4.66 Mil/uL (ref 3.87–5.11)
RDW: 13.3 % (ref 11.5–15.5)
WBC: 7.7 10*3/uL (ref 4.0–10.5)

## 2022-08-23 LAB — IRON,TIBC AND FERRITIN PANEL
%SAT: 26 % (calc) (ref 16–45)
Ferritin: 401 ng/mL — ABNORMAL HIGH (ref 16–232)
Iron: 76 ug/dL (ref 45–160)
TIBC: 290 mcg/dL (calc) (ref 250–450)

## 2022-08-23 LAB — COMPREHENSIVE METABOLIC PANEL
ALT: 20 U/L (ref 0–35)
AST: 21 U/L (ref 0–37)
Albumin: 4.2 g/dL (ref 3.5–5.2)
Alkaline Phosphatase: 84 U/L (ref 39–117)
BUN: 18 mg/dL (ref 6–23)
CO2: 27 mEq/L (ref 19–32)
Calcium: 9.5 mg/dL (ref 8.4–10.5)
Chloride: 102 mEq/L (ref 96–112)
Creatinine, Ser: 1.02 mg/dL (ref 0.40–1.20)
GFR: 64.07 mL/min (ref >60.00)
Glucose, Bld: 85 mg/dL (ref 70–99)
Potassium: 4.1 mEq/L (ref 3.5–5.1)
Sodium: 137 mEq/L (ref 135–145)
Total Bilirubin: 0.5 mg/dL (ref 0.2–1.2)
Total Protein: 8 g/dL (ref 6.0–8.3)

## 2022-08-23 LAB — TSH: TSH: 1.74 u[IU]/mL (ref 0.35–5.50)

## 2022-08-24 ENCOUNTER — Telehealth: Payer: Self-pay | Admitting: Family Medicine

## 2022-08-24 NOTE — Telephone Encounter (Signed)
Patient asking if she can double up on her omeprazole (PRILOSEC) 20 MG capsule because when the meds wear off the pain returns

## 2022-08-24 NOTE — Telephone Encounter (Signed)
Can take omeprazole 20 mg 1 tab twice a day.  Schedule follow-up with GI.

## 2022-08-31 NOTE — Telephone Encounter (Signed)
Spoke with pt, is aware she can take 1 tab twice daily. Informed her, pcp wants her to keep visit with GI and talk about the dose.

## 2022-09-21 ENCOUNTER — Ambulatory Visit (INDEPENDENT_AMBULATORY_CARE_PROVIDER_SITE_OTHER): Payer: BC Managed Care – PPO | Admitting: Gastroenterology

## 2022-09-21 ENCOUNTER — Encounter: Payer: Self-pay | Admitting: Gastroenterology

## 2022-09-21 VITALS — BP 150/100 | HR 102 | Ht 63.0 in | Wt 212.0 lb

## 2022-09-21 DIAGNOSIS — K641 Second degree hemorrhoids: Secondary | ICD-10-CM

## 2022-09-21 MED ORDER — PSYLLIUM 58.6 % PO PACK: 1.0000 | PACK | Freq: Every day | ORAL | 0 refills | Status: DC

## 2022-09-21 NOTE — Progress Notes (Signed)
52 y/o female here for follow up for hemorrhoid banding. I performed her screening colonoscopy in April.  She had moderate-sized internal hemorrhoids and complaining of symptoms from them.  She reports having some intermittent discomfort with bowel movements with swelling and irritation.  She does not have hard stools but does strain and spent some time on the toilet when she is moving her bowels.  She does have rare bleeding from this.  We had discussed options and she wanted to proceed with hemorrhoid banding today.  She endorses grade 2 prolapse as well.   Colonoscopy 07/18/22: - The perianal and digital rectal examinations were normal. - The terminal ileum appeared normal. - A 3 to 4 mm polyp was found in the sigmoid colon. The polyp was sessile. The polyp was removed with a cold snare. Resection and retrieval were complete. - Internal hemorrhoids were found during retroflexion. The hemorrhoids were moderate. - The exam was otherwise without abnormality  Diagnosis Surgical [P], colon, sigmoid, polyp (1) HYPERPLASTIC POLYP. NEGATIVE FOR DYSPLASIA.  Repeat exam in 10 years for screening   PROCEDURE NOTE: The patient presents with symptomatic grade II  hemorrhoids, requesting rubber band ligation of his/her hemorrhoidal disease.  All risks, benefits and alternative forms of therapy were described and informed consent was obtained.   The anorectum was pre-medicated with 0.125% nitroglycerin ointment The decision was made to band the LL internal hemorrhoid, and the [facility] System was used to perform band ligation without complication.  Digital anorectal examination was then performed to assure proper positioning of the band, and to adjust the banded tissue as required.  The patient was discharged home without pain or other issues.  Dietary and behavioral recommendations were given and along with follow-up instructions.     The following adjunctive treatments were recommended: Metamucil  daily  The patient will return in 2-4 weeks for  follow-up and possible additional banding as required. No complications were encountered and the patient tolerated the procedure well.   Harlin Rain, MD [facility]

## 2022-09-21 NOTE — Patient Instructions (Signed)
If your blood pressure at your visit was 140/90 or greater, please contact your primary care physician to follow up on this. ______________________________________________________  If you are age 51 or older, your body mass index should be between 23-30. Your Body mass index is 37.55 kg/m. If this is out of the aforementioned range listed, please consider follow up with your Primary Care Provider.  If you are age 42 or younger, your body mass index should be between 19-25. Your Body mass index is 37.55 kg/m. If this is out of the aformentioned range listed, please consider follow up with your Primary Care Provider.  ________________________________________________________  The [facility] providers would like to encourage you to use [facility] to communicate with providers for non-urgent requests or questions.  Due to long hold times on the telephone, sending your provider a message by [facility] may be a faster and more efficient way to get a response.  Please allow 48 business hours for a response.  Please remember that this is for non-urgent requests.  _______________________________________________________  Due to recent changes in healthcare laws, you may see the results of your imaging and laboratory studies on MyChart before your provider has had a chance to review them.  We understand that in some cases there may be results that are confusing or concerning to you. Not all laboratory results come back in the same time frame and the provider may be waiting for multiple results in order to interpret others.  Please give [place] 48 hours in order for your provider to thoroughly review all the results before contacting the office for clarification of your results.   HEMORRHOID BANDING PROCEDURE    FOLLOW-UP CARE   The procedure you have had should have been relatively painless since the banding of the area involved does not have nerve endings and there is no pain sensation.  The rubber band cuts off the  blood supply to the hemorrhoid and the band may fall off as soon as 48 hours after the banding (the band may occasionally be seen in the toilet bowl following a bowel movement). You may notice a temporary feeling of fullness in the rectum which should respond adequately to plain Tylenol or Motrin.  Following the banding, avoid strenuous exercise that evening and resume full activity the next day.  A sitz bath (soaking in a warm tub) or bidet is soothing, and can be useful for cleansing the area after bowel movements.     To avoid constipation, take two tablespoons of natural wheat bran, natural oat bran, flax, Benefiber or any over the counter fiber supplement and increase your water intake to 7-8 glasses daily.    Unless you have been prescribed anorectal medication, do not put anything inside your rectum for two weeks: No suppositories, enemas, fingers, etc.  Occasionally, you may have more bleeding than usual after the banding procedure.  This is often from the untreated hemorrhoids rather than the treated one.  Don't be concerned if there is a tablespoon or so of blood.  If there is more blood than this, lie flat with your bottom higher than your head and apply an ice pack to the area. If the bleeding does not stop within a half an hour or if you feel faint, call our office at [phone] or go to the emergency room.  Problems are not common; however, if there is a substantial amount of bleeding, severe pain, chills, fever or difficulty passing urine (very rare) or other problems, you should  call us at [phone] or report to the nearest emergency room.  Do not stay seated continuously for more than 2-3 hours for a day or two after the procedure.  Tighten your buttock muscles 10-15 times every two hours and take 10-15 deep breaths every 1-2 hours.  Do not spend more than a few minutes on the toilet if you cannot empty your bowel; instead re-visit the toilet at a later time.   You have  been scheduled for a follow up appointment (2nd banding) on Friday, 8-16 at 11:00am. Please arrive 10 minutes early for registration. If you need to reschedule or cancel this appointment please call [phone] as soon as possible. Thank you.  Please purchase the following medications over the counter and take as directed: Metamucil: Take once daily  Thank you for entrusting me with your care and for choosing [facility], Dr. Ileene Eddleman

## 2022-10-20 ENCOUNTER — Other Ambulatory Visit: Payer: Self-pay | Admitting: Family Medicine

## 2022-10-20 DIAGNOSIS — K2900 Acute gastritis without bleeding: Secondary | ICD-10-CM

## 2022-11-16 ENCOUNTER — Encounter: Payer: Self-pay | Admitting: Gastroenterology

## 2022-11-16 ENCOUNTER — Ambulatory Visit: Payer: BC Managed Care – PPO | Admitting: Gastroenterology

## 2022-11-16 VITALS — BP 116/78 | HR 92 | Ht 62.25 in | Wt 216.0 lb

## 2022-11-16 DIAGNOSIS — K641 Second degree hemorrhoids: Secondary | ICD-10-CM

## 2022-11-16 NOTE — Progress Notes (Signed)
HPI :  52 y/o female here for follow up for hemorrhoid banding. I performed her screening colonoscopy in April.  She had moderate-sized internal hemorrhoids and complaining of symptoms from them.  She reports had some intermittent discomfort with bowel movements with swelling and irritation.  She does have rare bleeding from this and grade 2 prolapse.  We had discussed options and she proceeded with banding of the left lateral hemorrhoid on June 21.  This is the first time if seen her since the last visit.  She states she has some pressure and soreness following the hemorrhoid banding which lasted for several days.  It was hard to sit and work with the discomfort.  She never called [place] about this, she states she thought it was normal part of the healing.  After 2 weeks her symptoms resolved and she feels fine at this time.  She states the banding definitely helped her hemorrhoid symptoms and those have significantly improved.  We discussed if she want to proceed with additional banding today.    Colonoscopy 07/18/22: - The perianal and digital rectal examinations were normal. - The terminal ileum appeared normal. - A 3 to 4 mm polyp was found in the sigmoid colon. The polyp was sessile. The polyp was removed with a cold snare. Resection and retrieval were complete. - Internal hemorrhoids were found during retroflexion. The hemorrhoids were moderate. - The exam was otherwise without abnormality   Diagnosis Surgical [P], colon, sigmoid, polyp (1) HYPERPLASTIC POLYP. NEGATIVE FOR DYSPLASIA.  Past Medical History:  Diagnosis Date   Allergy    SEASONAL   Anemia    Anxiety    Arthritis    Depression    Gallstones 1998   GERD (gastroesophageal reflux disease)    Hyperlipidemia    Hypertension    Seizures (HCC)    AS A CHILD,BUT NOT AS A ADULT     Past Surgical History:  Procedure Laterality Date   CHOLECYSTECTOMY     CYSTOSCOPY Bilateral 02/25/2019   Procedure: CYSTOSCOPY;  Surgeon:  Rande Brunt, MD;  Location: [facility];  Service: Gynecology;  Laterality: Bilateral;   ROBOTIC ASSISTED LAPAROSCOPIC HYSTERECTOMY AND SALPINGECTOMY Bilateral 02/25/2019   Procedure: XI ROBOTIC ASSISTED LAPAROSCOPIC HYSTERECTOMY AND SALPINGECTOMY;  Surgeon: Rande Brunt, MD;  Location: [facility];  Service: Gynecology;  Laterality: Bilateral;   TUBAL LIGATION     WISDOM TOOTH EXTRACTION     Family History  Problem Relation Age of Onset   Stomach cancer Mother    Arthritis Mother    Hearing loss Mother    Hypertension Mother    Stomach cancer Sister    Hypertension Sister    Hypertension Sister    Hypertension Sister    Hypertension Sister    Hypertension Sister    Hypertension Brother    Hypertension Brother    Hypertension Brother    Hypertension Brother    Other Daughter        died in MVA   Colon cancer Neg Hx    Colon polyps Neg Hx    Esophageal cancer Neg Hx    Crohn's disease Neg Hx    Rectal cancer Neg Hx    Ulcerative colitis Neg Hx    Social History   Tobacco Use   Smoking status: Every Day    Current packs/day: 0.25    Types: Cigarettes   Smokeless tobacco: Never  Vaping Use   Vaping status: Never Used  Substance Use Topics  Alcohol use: Yes    Comment: occ   Drug use: No   Current Outpatient Medications  Medication Sig Dispense Refill   aspirin EC 81 MG tablet Take 81 mg by mouth daily. Swallow whole.     hydrochlorothiazide (HYDRODIURIL) 12.5 MG tablet TAKE 1 TABLET BY MOUTH EVERY DAY 90 tablet 2   ibuprofen (ADVIL) 600 MG tablet Take 600 mg by mouth every 6 (six) hours as needed.     Iron, Ferrous Sulfate, 325 (65 Fe) MG TABS Take 325 mg by mouth daily.      omeprazole (PRILOSEC) 20 MG capsule TAKE 1 CAPSULE(20 MG) BY MOUTH DAILY 30 capsule 1   psyllium (METAMUCIL) 58.6 % packet Take 1 packet by mouth daily. 30 each 0   spironolactone (ALDACTONE) 25 MG tablet Take 0.5 tablets (12.5 mg total) by  mouth daily. 90 tablet 3   VITAMIN D PO Take by mouth. TAKE GUMMIES DAILY     No current facility-administered medications for this visit.   Allergies  Allergen Reactions   Shellfish Allergy Anaphylaxis and Swelling   Lisinopril     Dry cough     Review of Systems: All systems reviewed and negative except where noted in HPI.     Physical Exam: BP 116/78 (BP Location: Left Arm, Patient Position: Sitting, Cuff Size: Large)   Pulse 92   Ht 5' 2.25" (1.581 m)   Wt 216 lb (98 kg)   LMP 02/23/2019   BMI 39.19 kg/m  Constitutional: Pleasant,well-developed, female in no acute distress. Skin: Skin is warm and dry. No rashes noted. Psychiatric: Normal mood and affect. Behavior is normal.   ASSESSMENT: 52 y.o. female here for assessment of the following  1. Grade II hemorrhoids    Status post left lateral hemorrhoid banding in June.  She had some discomfort post procedure which lasted several days and then ultimately resolved.  I suspect she had post banding ulcer related pain/rectal spasm.  I counseled her to please contact me should this ever happen again, would have treated her with topical nitroglycerin or diltiazem ointment which is quite effective for this.  Fortunately the symptoms resolved after 2 weeks or so when she healed from the banding, her hemorrhoid related symptoms have fortunately responded quite well to the banding and she is doing better in that regard.  We discussed whether or not to pursue further banding, normally we recommend 3 banding sessions for good long-term response.  She does wish to proceed with this however concerned about recurrent rectal discomfort with this.  I would give her topical nitroglycerin to go home with if we decide to do additional banding, counseled her the risk for recurrent pain like this post banding in general is low.  Given her work schedule in the next week or 2, she does not feel comfortable proceeding today but wanted to proceed  before Labor Day if possible or she has some time off and can recover without interfering with her work.  She was rescheduled for August 28 proceed at that time.  Harlin Rain, MD [facility]

## 2022-11-16 NOTE — Patient Instructions (Signed)
You have been scheduled for a hemorrhoid banding appointment on Wed, 8-28 at 4:00pm. Please arrive 10 minutes early for registration. If you need to reschedule or cancel this appointment please call [phone] as soon as possible. Thank you.  Thank you for entrusting me with your care and for choosing [facility], Dr. Ileene Picotte  If your blood pressure at your visit was 140/90 or greater, please contact your primary care physician to follow up on this. ______________________________________________________  If you are age 75 or older, your body mass index should be between 23-30. Your Body mass index is 39.19 kg/m. If this is out of the aforementioned range listed, please consider follow up with your Primary Care Provider.  If you are age 13 or younger, your body mass index should be between 19-25. Your Body mass index is 39.19 kg/m. If this is out of the aformentioned range listed, please consider follow up with your Primary Care Provider.  ________________________________________________________  The [place] [facility] providers would like to encourage you to use [facility] to communicate with providers for non-urgent requests or questions.  Due to long hold times on the telephone, sending your provider a message by [facility] may be a faster and more efficient way to get a response.  Please allow 48 business hours for a response.  Please remember that this is for non-urgent requests.  _______________________________________________________  Due to recent changes in healthcare laws, you may see the results of your imaging and laboratory studies on MyChart before your provider has had a chance to review them.  We understand that in some cases there may be results that are confusing or concerning to you. Not all laboratory results come back in the same time frame and the provider may be waiting for multiple results in order to interpret others.  Please give [place] 48 hours in order for your provider  to thoroughly review all the results before contacting the office for clarification of your results.

## 2022-11-23 ENCOUNTER — Other Ambulatory Visit: Payer: Self-pay | Admitting: Family Medicine

## 2022-11-23 DIAGNOSIS — I10 Essential (primary) hypertension: Secondary | ICD-10-CM

## 2022-11-28 ENCOUNTER — Encounter: Payer: Self-pay | Admitting: Gastroenterology

## 2022-11-28 ENCOUNTER — Ambulatory Visit: Payer: BC Managed Care – PPO | Admitting: Gastroenterology

## 2022-11-28 VITALS — BP 132/80 | HR 97 | Ht 62.5 in | Wt 215.0 lb

## 2022-11-28 DIAGNOSIS — K641 Second degree hemorrhoids: Secondary | ICD-10-CM | POA: Diagnosis not present

## 2022-11-28 MED ORDER — AMBULATORY NON FORMULARY MEDICATION: 1 refills | Status: DC

## 2022-11-28 NOTE — Progress Notes (Signed)
52 y/o female here for follow up for hemorrhoid banding. Screening colonoscopy done in April.  She had moderate-sized internal hemorrhoids and complaining of symptoms from them.  She reports had some intermittent discomfort with bowel movements with swelling and irritation.  She does have rare bleeding from this and grade 2 prolapse.  We had discussed options and she proceeded with banding of the left lateral hemorrhoid on June 21.  She had some pain post procedure after the first banding, she unfortunately never called [place] about it and thought it was part of normal healing.  After a few weeks her symptoms resolved and she is back to baseline.  At her last visit she wanted to postpone the next banding until she had a better work schedule to accommodate it.  She is feeling about the same.  No rectal pain at this point time.  We discussed banding and risks of it and she wants to proceed.    Colonoscopy 07/18/22: - The perianal and digital rectal examinations were normal. - The terminal ileum appeared normal. - A 3 to 4 mm polyp was found in the sigmoid colon. The polyp was sessile. The polyp was removed with a cold snare. Resection and retrieval were complete. - Internal hemorrhoids were found during retroflexion. The hemorrhoids were moderate. - The exam was otherwise without abnormality    PROCEDURE NOTE: The patient presents with symptomatic grade II  hemorrhoids, requesting rubber band ligation of his/her hemorrhoidal disease.  All risks, benefits and alternative forms of therapy were described and informed consent was obtained.   The anorectum was pre-medicated with 0.125% nitroglycerin ointment The decision was made to band the RP internal hemorrhoid, and the [facility] O'Regan System was used to perform band ligation without complication.  Digital anorectal examination was then performed to assure proper positioning of the band, and to adjust the banded tissue as required.  The patient was discharged  home without pain or other issues.  Dietary and behavioral recommendations were given and along with follow-up instructions.     The following adjunctive treatments were recommended: Provided topical nitroglycerin ointment to use PRN for recurrent pain post procedure  The patient will return in 2-4 weeks for  follow-up and possible additional banding as required. No complications were encountered and the patient tolerated the procedure well.  Harlin Rain, MD [facility]

## 2022-11-28 NOTE — Patient Instructions (Addendum)
HEMORRHOID BANDING PROCEDURE    FOLLOW-UP CARE   The procedure you have had should have been relatively painless since the banding of the area involved does not have nerve endings and there is no pain sensation.  The rubber band cuts off the blood supply to the hemorrhoid and the band may fall off as soon as 48 hours after the banding (the band may occasionally be seen in the toilet bowl following a bowel movement). You may notice a temporary feeling of fullness in the rectum which should respond adequately to plain Tylenol or Motrin.  Following the banding, avoid strenuous exercise that evening and resume full activity the next day.  A sitz bath (soaking in a warm tub) or bidet is soothing, and can be useful for cleansing the area after bowel movements.     To avoid constipation, take two tablespoons of natural wheat bran, natural oat bran, flax, Benefiber or any over the counter fiber supplement and increase your water intake to 7-8 glasses daily.    Unless you have been prescribed anorectal medication, do not put anything inside your rectum for two weeks: No suppositories, enemas, fingers, etc.  Occasionally, you may have more bleeding than usual after the banding procedure.  This is often from the untreated hemorrhoids rather than the treated one.  Don't be concerned if there is a tablespoon or so of blood.  If there is more blood than this, lie flat with your bottom higher than your head and apply an ice pack to the area. If the bleeding does not stop within a half an hour or if you feel faint, call our office at [phone] or go to the emergency room.  Problems are not common; however, if there is a substantial amount of bleeding, severe pain, chills, fever or difficulty passing urine (very rare) or other problems, you should call us at [phone] or report to the nearest emergency room.  Do not stay seated continuously for more than 2-3 hours for a day or two after the procedure.   Tighten your buttock muscles 10-15 times every two hours and take 10-15 deep breaths every 1-2 hours.  Do not spend more than a few minutes on the toilet if you cannot empty your bowel; instead re-visit the toilet at a later time.   You have been scheduled for your 3rd hemorrhoid banding on Monday, 01-07-23 at 4:00pm. Please arrive 10 minutes early for registration. If you need to reschedule or cancel this appointment please call [phone] as soon as possible. Thank you.  ________________________________________________________________ We have sent a prescription for nitroglycerin 0.125% gel to [facility]. You should apply a pea size amount to your rectum three times daily as needed  [facility] information is below: Address: [address], [place], [place] [address]  [phone]  *Please DO NOT go directly from our office to pick up this medication! Give the pharmacy 1 day to process the prescription as this is compounded and takes time to make. _________________________________________________________________  Thank you for entrusting me with your care and for choosing [facility], Dr. Ileene Nicholson  If your blood pressure at your visit was 140/90 or greater, please contact your primary care physician to follow up on this. ______________________________________________________  If you are age 51 or older, your body mass index should be between 23-30. Your Body mass index is 38.7 kg/m. If this is out of the aforementioned range listed, please consider follow up with your Primary Care Provider.  If you are age 51 or younger, your body mass index should be between 19-25. Your Body mass index is 38.7 kg/m. If this is out of the aformentioned range listed, please consider follow up with your Primary Care Provider.  ________________________________________________________  The [place] [facility] providers would like to encourage you to use [facility] to  communicate with providers for non-urgent requests or questions.  Due to long hold times on the telephone, sending your provider a message by [facility] may be a faster and more efficient way to get a response.  Please allow 48 business hours for a response.  Please remember that this is for non-urgent requests.  _______________________________________________________  Due to recent changes in healthcare laws, you may see the results of your imaging and laboratory studies on MyChart before your provider has had a chance to review them.  We understand that in some cases there may be results that are confusing or concerning to you. Not all laboratory results come back in the same time frame and the provider may be waiting for multiple results in order to interpret others.  Please give [place] 48 hours in order for your provider to thoroughly review all the results before contacting the office for clarification of your results.

## 2023-01-07 ENCOUNTER — Encounter: Payer: BC Managed Care – PPO | Admitting: Gastroenterology

## 2023-01-07 NOTE — Progress Notes (Deleted)
Last seen 8/28  52 y/o female here for follow up for hemorrhoid banding. Screening colonoscopy done in April.  She had moderate-sized internal hemorrhoids and complaining of symptoms from them.  She reports had some intermittent discomfort with bowel movements with swelling and irritation.  She does have rare bleeding from this and grade 2 prolapse.  We had discussed options and she proceeded with banding of the left lateral hemorrhoid on June 21. Had RP hemorrhoid banded on 8/28     She had some pain post procedure after the first banding, she unfortunately never called [place] about it and thought it was part of normal healing.  After a few weeks her symptoms resolved and she is back to baseline.  At her last visit she wanted to postpone the next banding until she had a better work schedule to accommodate it.   She is feeling about the same.  No rectal pain at this point time.  We discussed banding and risks of it and she wants to proceed.    Colonoscopy 07/18/22: - The perianal and digital rectal examinations were normal. - The terminal ileum appeared normal. - A 3 to 4 mm polyp was found in the sigmoid colon. The polyp was sessile. The polyp was removed with a cold snare. Resection and retrieval were complete. - Internal hemorrhoids were found during retroflexion. The hemorrhoids were moderate. - The exam was otherwise without abnormality     PROCEDURE NOTE: The patient presents with symptomatic grade II  hemorrhoids, requesting rubber band ligation of his/her hemorrhoidal disease.  All risks, benefits and alternative forms of therapy were described and informed consent was obtained.     The anorectum was pre-medicated with 0.125% nitroglycerin ointment The decision was made to band the  internal hemorrhoid, and the [facility] O'Regan System was used to perform band ligation without complication.  Digital anorectal examination was then performed to assure proper positioning of the band, and to adjust the  banded tissue as required.  The patient was discharged home without pain or other issues.  Dietary and behavioral recommendations were given and along with follow-up instructions.     The following adjunctive treatments were recommended: Provided topical nitroglycerin ointment to use PRN for recurrent pain post procedure   The patient will return in 2-4 weeks for  follow-up and possible additional banding as required. No complications were encountered and the patient tolerated the procedure well.   Harlin Rain, MD [facility]

## 2023-02-07 ENCOUNTER — Encounter: Payer: BC Managed Care – PPO | Admitting: Gastroenterology

## 2023-02-18 ENCOUNTER — Ambulatory Visit: Payer: BC Managed Care – PPO | Admitting: Family Medicine

## 2023-02-18 ENCOUNTER — Encounter: Payer: Self-pay | Admitting: Family Medicine

## 2023-02-18 VITALS — BP 110/88 | HR 89 | Temp 98.1°F | Ht 62.5 in | Wt 213.0 lb

## 2023-02-18 DIAGNOSIS — G8929 Other chronic pain: Secondary | ICD-10-CM | POA: Diagnosis not present

## 2023-02-18 DIAGNOSIS — M25512 Pain in left shoulder: Secondary | ICD-10-CM

## 2023-02-18 MED ORDER — METHOCARBAMOL 500 MG PO TABS: 500.0000 mg | ORAL_TABLET | Freq: Three times a day (TID) | ORAL | 0 refills | Status: AC | PRN

## 2023-02-18 MED ORDER — PREDNISONE 10 MG PO TABS: ORAL_TABLET | ORAL | 0 refills | Status: AC

## 2023-02-18 NOTE — Progress Notes (Signed)
Acute Office Visit   Subjective:  Patient ID: Erika Larson, female    DOB: [birth year], 52 y.o.   MRN: [id number]  Chief Complaint  Patient presents with   Shoulder Pain    Pt c/o L shoulder pain. Was told by pcp she had an arthritis. Pt reports hurts to lift her shoulder. Sx started when she was seen with pcp two years ago. Using cream for arthritis, no relief.     HPI Patient is complaining of left shoulder pain. She reports the pain started two years ago. Constant daily pain. Describes pain as tightness and pressure. She reports today she noticed when lifting the left arm, it was harder to left. She has been using Biofreeze with no relief. Denies any recent injury. Patient had an x-ray of the left shoulder back in March 2022. She had mild degenerative changes with no acute findings.   ROS See HPI above      Objective:   BP 110/88 (BP Location: Right Arm, Patient Position: Sitting, Cuff Size: Large)   Pulse 89   Temp 98.1 F (36.7 C) (Oral)   Ht 5' 2.5" (1.588 m)   Wt 213 lb (96.6 kg)   LMP 02/23/2019   SpO2 97%   BMI 38.34 kg/m    Physical Exam Vitals reviewed.  Constitutional:      General: She is not in acute distress.    Appearance: Normal appearance. She is not ill-appearing, toxic-appearing or diaphoretic.  HENT:     Head: Normocephalic and atraumatic.  Eyes:     General:        Right eye: No discharge.        Left eye: No discharge.     Conjunctiva/sclera: Conjunctivae normal.  Cardiovascular:     Rate and Rhythm: Normal rate and regular rhythm.     Heart sounds: Normal heart sounds. No murmur heard.    No friction rub. No gallop.  Pulmonary:     Effort: Pulmonary effort is normal. No respiratory distress.     Breath sounds: Normal breath sounds.  Musculoskeletal:     Left shoulder: Crepitus (Mild) present. No swelling, deformity or tenderness. Decreased range of motion. Normal strength. Normal pulse.  Skin:    General: Skin is warm and dry.   Neurological:     General: No focal deficit present.     Mental Status: She is alert and oriented to person, place, and time. Mental status is at baseline.  Psychiatric:        Mood and Affect: Mood normal.        Behavior: Behavior normal.        Thought Content: Thought content normal.        Judgment: Judgment normal.       Assessment & Plan:  Chronic left shoulder pain -     predniSONE; Take 6 tablets (60 mg total) by mouth daily with breakfast for 1 day, THEN 5 tablets (50 mg total) daily with breakfast for 1 day, THEN 4 tablets (40 mg total) daily with breakfast for 1 day, THEN 3 tablets (30 mg total) daily with breakfast for 1 day, THEN 2 tablets (20 mg total) daily with breakfast for 1 day, THEN 1 tablet (10 mg total) daily with breakfast for 1 day.  Dispense: 21 tablet; Refill: 0 -     Methocarbamol; Take 1 tablet (500 mg total) by mouth every 8 (eight) hours as needed for up to 7 days for muscle spasms.  Dispense: 21  tablet; Refill: 0  -Prescribed Prednisone 10mg  tablet, 6 day tapper pack. Please start this medication in the morning since it can keep you awake. Please do not take any NSAIDS, such as Ibuprofen, Advil, Aleve, or Naproxen while taking medication. You may take all tablets together or space them throughout the day.  -Prescribed Robaxin 500mg  tablet, take 1 tablet every 8 hours as needed for muscle pain. Caution: Medication can cause drowsiness. Strongly recommend to take medication at nighttime.  -Follow up with either Dr. Salomon Fick or myself if medication is not helpful. Discussed about possibly starting a long acting NSAID or a possible referral to ortho if not improved.   No follow-ups on file.  Zandra Abts, NP

## 2023-02-18 NOTE — Patient Instructions (Addendum)
-  START Prednisone 10mg  tablet, 6 day tapper pack. Please start this medication in the morning since it can keep you awake. Please do not take any NSAIDS, such as Ibuprofen, Advil, Aleve, or Naproxen while taking medication. You may take all tablets together or space them throughout the day.  -START Robaxin 500mg  tablet, take 1 tablet every 8 hours as needed for muscle pain. Caution: Medication can cause drowsiness. Strongly recommend to take medication at nighttime.  -Follow up with either Dr. Salomon Fick or myself if medication is not helpful. Discussed about possibly starting a long acting NSAID or a possible referral to ortho if not improved.

## 2023-03-26 ENCOUNTER — Other Ambulatory Visit: Payer: Self-pay | Admitting: Family Medicine

## 2023-03-26 DIAGNOSIS — I10 Essential (primary) hypertension: Secondary | ICD-10-CM

## 2023-04-25 ENCOUNTER — Ambulatory Visit (INDEPENDENT_AMBULATORY_CARE_PROVIDER_SITE_OTHER): Payer: 59 | Admitting: Family Medicine

## 2023-04-25 ENCOUNTER — Encounter: Payer: Self-pay | Admitting: Family Medicine

## 2023-04-25 VITALS — BP 120/80 | HR 90 | Temp 97.8°F | Ht 62.5 in | Wt 213.4 lb

## 2023-04-25 DIAGNOSIS — B351 Tinea unguium: Secondary | ICD-10-CM | POA: Diagnosis not present

## 2023-04-25 DIAGNOSIS — B070 Plantar wart: Secondary | ICD-10-CM

## 2023-04-25 DIAGNOSIS — Z131 Encounter for screening for diabetes mellitus: Secondary | ICD-10-CM | POA: Diagnosis not present

## 2023-04-25 DIAGNOSIS — Z0000 Encounter for general adult medical examination without abnormal findings: Secondary | ICD-10-CM | POA: Diagnosis not present

## 2023-04-25 DIAGNOSIS — I10 Essential (primary) hypertension: Secondary | ICD-10-CM | POA: Diagnosis not present

## 2023-04-25 DIAGNOSIS — Z1231 Encounter for screening mammogram for malignant neoplasm of breast: Secondary | ICD-10-CM

## 2023-04-25 DIAGNOSIS — E7841 Elevated Lipoprotein(a): Secondary | ICD-10-CM

## 2023-04-25 LAB — COMPREHENSIVE METABOLIC PANEL
ALT: 16 U/L (ref 0–35)
AST: 17 U/L (ref 0–37)
Albumin: 4.4 g/dL (ref 3.5–5.2)
Alkaline Phosphatase: 76 U/L (ref 39–117)
BUN: 19 mg/dL (ref 6–23)
CO2: 31 meq/L (ref 19–32)
Calcium: 9.4 mg/dL (ref 8.4–10.5)
Chloride: 102 meq/L (ref 96–112)
Creatinine, Ser: 0.83 mg/dL (ref 0.40–1.20)
GFR: 81.67 mL/min (ref >60.00)
Glucose, Bld: 81 mg/dL (ref 70–99)
Potassium: 3.7 meq/L (ref 3.5–5.1)
Sodium: 140 meq/L (ref 135–145)
Total Bilirubin: 0.6 mg/dL (ref 0.2–1.2)
Total Protein: 7.8 g/dL (ref 6.0–8.3)

## 2023-04-25 LAB — CBC WITH DIFFERENTIAL/PLATELET
Basophils Absolute: 0 10*3/uL (ref 0.0–0.1)
Basophils Relative: 0.7 % (ref 0.0–3.0)
Eosinophils Absolute: 0.2 10*3/uL (ref 0.0–0.7)
Eosinophils Relative: 2.4 % (ref 0.0–5.0)
HCT: 42.7 % (ref 36.0–46.0)
Hemoglobin: 14.2 g/dL (ref 12.0–15.0)
Lymphocytes Relative: 25.2 % (ref 12.0–46.0)
Lymphs Abs: 1.8 10*3/uL (ref 0.7–4.0)
MCHC: 33.3 g/dL (ref 30.0–36.0)
MCV: 91.1 fL (ref 78.0–100.0)
Monocytes Absolute: 0.3 10*3/uL (ref 0.1–1.0)
Monocytes Relative: 3.8 % (ref 3.0–12.0)
Neutro Abs: 4.9 10*3/uL (ref 1.4–7.7)
Neutrophils Relative %: 67.9 % (ref 43.0–77.0)
Platelets: 209 10*3/uL (ref 150.0–400.0)
RBC: 4.68 Mil/uL (ref 3.87–5.11)
RDW: 13.2 % (ref 11.5–15.5)
WBC: 7.3 10*3/uL (ref 4.0–10.5)

## 2023-04-25 LAB — LIPID PANEL
Cholesterol: 175 mg/dL (ref 0–200)
HDL: 40.9 mg/dL (ref >39.00)
LDL Cholesterol: 111 mg/dL — ABNORMAL HIGH (ref 0–99)
NonHDL: 134.22
Total CHOL/HDL Ratio: 4
Triglycerides: 117 mg/dL (ref 0.0–149.0)
VLDL: 23.4 mg/dL (ref 0.0–40.0)

## 2023-04-25 LAB — T4, FREE: Free T4: 0.79 ng/dL (ref 0.60–1.60)

## 2023-04-25 LAB — TSH: TSH: 0.95 u[IU]/mL (ref 0.35–5.50)

## 2023-04-25 LAB — HEMOGLOBIN A1C: Hgb A1c MFr Bld: 5.3 % (ref 4.6–6.5)

## 2023-04-25 NOTE — Progress Notes (Signed)
Established Patient Office Visit   Subjective  Patient ID: Erika Larson, female    DOB: 10-28-1970  Age: 53 y.o. MRN: [id number]  Chief Complaint  Patient presents with   Annual Exam    Patient is a 53 year old female seen for CPE.  Patient states she has been doing well overall.  Blood pressure has been good.  Patient notes bilateral toenails have fallen off and are dark brown in color.  Pt also notes peeling between toes of R foot.  Feet are non pruritic and do not hurt.  Pt states feet sweat a lot.  Also notes peeling of skin of bilateral hands.  Pt was unsure if she had an allergy to the gloves used at work.  States they may be latex.  Patient has not had mammogram in a while.  Was having done at OB/GYN office.    Patient Active Problem List   Diagnosis Date Noted   Status post hysterectomy 02/25/2019   Essential hypertension 04/27/2018   Arthritis 04/27/2018   Cigarette nicotine dependence without complication 04/27/2018   Obesity with body mass index 30 or greater 08/07/2016   Menorrhagia 08/03/2016   Smoker 08/03/2016   Past Medical History:  Diagnosis Date   Allergy    SEASONAL   Anemia    Anxiety    Arthritis    Depression    Gallstones 1998   GERD (gastroesophageal reflux disease)    Hyperlipidemia    Hypertension    Seizures (HCC)    AS A CHILD,BUT NOT AS A ADULT   Past Surgical History:  Procedure Laterality Date   CHOLECYSTECTOMY     CYSTOSCOPY Bilateral 02/25/2019   Procedure: CYSTOSCOPY;  Surgeon: Rande Brunt, MD;  Location: [facility];  Service: Gynecology;  Laterality: Bilateral;   ROBOTIC ASSISTED LAPAROSCOPIC HYSTERECTOMY AND SALPINGECTOMY Bilateral 02/25/2019   Procedure: XI ROBOTIC ASSISTED LAPAROSCOPIC HYSTERECTOMY AND SALPINGECTOMY;  Surgeon: Rande Brunt, MD;  Location: [facility];  Service: Gynecology;  Laterality: Bilateral;   TUBAL LIGATION     WISDOM TOOTH EXTRACTION      Social History   Tobacco Use   Smoking status: Every Day    Current packs/day: 0.25    Types: Cigarettes   Smokeless tobacco: Never  Vaping Use   Vaping status: Never Used  Substance Use Topics   Alcohol use: Yes    Comment: occ   Drug use: No   Family History  Problem Relation Age of Onset   Stomach cancer Mother    Arthritis Mother    Hearing loss Mother    Hypertension Mother    Stomach cancer Sister    Hypertension Sister    Hypertension Sister    Hypertension Sister    Hypertension Sister    Hypertension Sister    Hypertension Brother    Hypertension Brother    Hypertension Brother    Hypertension Brother    Other Daughter        died in MVA   Colon cancer Neg Hx    Colon polyps Neg Hx    Esophageal cancer Neg Hx    Crohn's disease Neg Hx    Rectal cancer Neg Hx    Ulcerative colitis Neg Hx    Allergies  Allergen Reactions   Shellfish Allergy Anaphylaxis and Swelling   Lisinopril     Dry cough      ROS Negative unless stated above    Objective:     BP 120/80 (BP  Location: Left Arm, Patient Position: Sitting, Cuff Size: Large)   Pulse 90   Temp 97.8 F (36.6 C) (Oral)   Ht 5' 2.5" (1.588 m)   Wt 213 lb 6.4 oz (96.8 kg)   LMP 02/23/2019   SpO2 97%   BMI 38.41 kg/m  BP Readings from Last 3 Encounters:  04/25/23 120/80  02/18/23 110/88  11/28/22 132/80   Wt Readings from Last 3 Encounters:  04/25/23 213 lb 6.4 oz (96.8 kg)  02/18/23 213 lb (96.6 kg)  11/28/22 215 lb (97.5 kg)      Physical Exam Constitutional:      Appearance: Normal appearance.  HENT:     Head: Normocephalic and atraumatic.     Right Ear: Tympanic membrane, ear canal and external ear normal.     Left Ear: Tympanic membrane, ear canal and external ear normal.     Nose: Nose normal.     Mouth/Throat:     Mouth: Mucous membranes are moist.     Pharynx: No oropharyngeal exudate or posterior oropharyngeal erythema.  Eyes:     General: No scleral icterus.     Extraocular Movements: Extraocular movements intact.     Conjunctiva/sclera: Conjunctivae normal.     Pupils: Pupils are equal, round, and reactive to light.  Neck:     Thyroid: No thyromegaly.  Cardiovascular:     Rate and Rhythm: Normal rate and regular rhythm.     Pulses: Normal pulses.     Heart sounds: Normal heart sounds. No murmur heard.    No friction rub.  Pulmonary:     Effort: Pulmonary effort is normal.     Breath sounds: Normal breath sounds. No wheezing, rhonchi or rales.  Abdominal:     General: Bowel sounds are normal.     Palpations: Abdomen is soft.     Tenderness: There is no abdominal tenderness.  Musculoskeletal:        General: No deformity. Normal range of motion.  Lymphadenopathy:     Cervical: No cervical adenopathy.  Skin:    General: Skin is warm and dry.     Findings: No lesion.     Comments: Plantar warts on b/l feet.  B/l great toe nails hypertrophic, brown, broken off almost to cuticle.  Webspace between right great toe second digit with hyperpigmented, healing skin.  Skin of b/l hands palms peeling.  Neurological:     General: No focal deficit present.     Mental Status: She is alert and oriented to person, place, and time.  Psychiatric:        Mood and Affect: Mood normal.        Thought Content: Thought content normal.       04/25/2023    1:14 PM 04/23/2022    1:12 PM 10/02/2021   11:06 AM  Depression screen PHQ 2/9  Decreased Interest 3 3 3   Down, Depressed, Hopeless 0 1 0  PHQ - 2 Score 3 4 3   Altered sleeping 1 1 0  Tired, decreased energy 1 1 2   Change in appetite 0 1 1  Feeling bad or failure about yourself  0 1 1  Trouble concentrating 1 1 0  Moving slowly or fidgety/restless 0 0 0  Suicidal thoughts 0 0 0  PHQ-9 Score 6 9 7   Difficult doing work/chores  Somewhat difficult Not difficult at all      04/25/2023    1:14 PM 06/09/2020    3:33 PM 04/28/2018    5:45 PM  GAD 7 : Generalized Anxiety Score  Nervous, Anxious, on Edge 1  1 0  Control/stop worrying 1 1 1   Worry too much - different things 1 1 1   Trouble relaxing 1 1 1   Restless 0 1 0  Easily annoyed or irritable 1 1 0  Afraid - awful might happen 1 1 1   Total GAD 7 Score 6 7 4   Anxiety Difficulty Not difficult at all     No results found for any visits on 04/25/23.    Assessment & Plan:  Well adult exam -Anticipatory guidance given including wearing seatbelts, smoke detectors in the home, increasing physical activity, increasing p.o. intake of water and vegetables. -Obtain labs -Immunizations reviewed.  Consider Shingrix and influenza vaccines. -Pap not indicated 2/2 history of hysterectomy and salpingectomy 2020 -Colonoscopy done 07/18/2022 -Mammogram due.  Order placed. -Next CPE in 1 year -     CBC with Differential/Platelet; Future -     Hemoglobin A1c; Future  Essential hypertension -Uncontrolled -Continue current medications HCTZ 12.5 mg daily and spironolactone 25 mg daily. -Continue lifestyle modifications -     Comprehensive metabolic panel; Future -     TSH; Future -     T4, free; Future  Elevated lipoprotein(a) -Diet changes -Consider statin based on results -     Lipid panel; Future  Onychomycosis -Various treatment options.  Advised OTC meds can be helpful.  Discussed likely duration of treatment needed. -Wearing moisture wicking socks. -Given handouts  Plantar warts -Noted on bilateral soles -Discussed treatment options  Encounter for screening mammogram for malignant neoplasm of breast -     3D Screening Mammogram, Left and Right; Future   Return in about 3 months (around 07/24/2023), or if symptoms worsen or fail to improve, for skin peeling, nails,etc..   Deeann Saint, MD

## 2023-05-06 ENCOUNTER — Encounter: Payer: Self-pay | Admitting: Gastroenterology

## 2023-05-06 ENCOUNTER — Ambulatory Visit: Payer: 59 | Admitting: Gastroenterology

## 2023-05-06 VITALS — BP 132/96 | HR 112 | Ht 62.25 in | Wt 216.1 lb

## 2023-05-06 DIAGNOSIS — K641 Second degree hemorrhoids: Secondary | ICD-10-CM | POA: Diagnosis not present

## 2023-05-06 NOTE — Progress Notes (Signed)
53 y/o female here for follow up for hemorrhoid banding. Screening colonoscopy done 07/2022.  She had moderate-sized internal hemorrhoids and complaining of symptoms from them.  She reports had some intermittent discomfort with bowel movements with swelling and irritation.  She does have rare bleeding from this and grade 2 prolapse.  We had discussed options and she proceeded with banding of the left lateral hemorrhoid on 09/2022. She had some pain post procedure after the first banding, she unfortunately never called [place] about it and thought it was part of normal healing.  After a few weeks her symptoms resolved.  I last saw her August 2024, at that time I banded the RP hemorrhoid.  She states she tolerated that well.  Since she has had 2 banding's her hemorrhoid symptoms have significantly improved.  Really does not have much blood in her stool, hemorrhoids have been improved and her bowels have been fairly regular.  She is not currently taking any fiber supplements her bowels.  We discussed if she wants to proceed with any additional hemorrhoid banding today, she did not want to complete the series to help reduce her risk for recurrent symptoms over time     Colonoscopy 07/18/22: - The perianal and digital rectal examinations were normal. - The terminal ileum appeared normal. - A 3 to 4 mm polyp was found in the sigmoid colon. The polyp was sessile. The polyp was removed with a cold snare. Resection and retrieval were complete. - Internal hemorrhoids were found during retroflexion. The hemorrhoids were moderate. - The exam was otherwise without abnormality     PROCEDURE NOTE: The patient presents with symptomatic grade II  hemorrhoids, requesting rubber band ligation of his/her hemorrhoidal disease.  All risks, benefits and alternative forms of therapy were described and informed consent was obtained.     The anorectum was pre-medicated with 0.125% nitroglycerin ointment The decision was made to band  the RA internal hemorrhoid, and the [facility] System was used to perform band ligation without complication.  Digital anorectal examination was then performed to assure proper positioning of the band, and to adjust the banded tissue as required.  The patient was discharged home without pain or other issues.  Dietary and behavioral recommendations were given and along with follow-up instructions.      The patient will return as needed moving forward, no further scheduled banding. No complications were encountered and the patient tolerated the procedure well.   Harlin Rain, MD [facility]

## 2023-05-06 NOTE — Patient Instructions (Signed)
HEMORRHOID BANDING PROCEDURE    FOLLOW-UP CARE   The procedure you have had should have been relatively painless since the banding of the area involved does not have nerve endings and there is no pain sensation.  The rubber band cuts off the blood supply to the hemorrhoid and the band may fall off as soon as 48 hours after the banding (the band may occasionally be seen in the toilet bowl following a bowel movement). You may notice a temporary feeling of fullness in the rectum which should respond adequately to plain Tylenol or Motrin.  Following the banding, avoid strenuous exercise that evening and resume full activity the next day.  A sitz bath (soaking in a warm tub) or bidet is soothing, and can be useful for cleansing the area after bowel movements.     To avoid constipation, take two tablespoons of natural wheat bran, natural oat bran, flax, Benefiber or any over the counter fiber supplement and increase your water intake to 7-8 glasses daily.    Unless you have been prescribed anorectal medication, do not put anything inside your rectum for two weeks: No suppositories, enemas, fingers, etc.  Occasionally, you may have more bleeding than usual after the banding procedure.  This is often from the untreated hemorrhoids rather than the treated one.  Don't be concerned if there is a tablespoon or so of blood.  If there is more blood than this, lie flat with your bottom higher than your head and apply an ice pack to the area. If the bleeding does not stop within a half an hour or if you feel faint, call our office at [phone] or go to the emergency room.  Problems are not common; however, if there is a substantial amount of bleeding, severe pain, chills, fever or difficulty passing urine (very rare) or other problems, you should call us at [phone] or report to the nearest emergency room.  Do not stay seated continuously for more than 2-3 hours for a day or two after the procedure.   Tighten your buttock muscles 10-15 times every two hours and take 10-15 deep breaths every 1-2 hours.  Do not spend more than a few minutes on the toilet if you cannot empty your bowel; instead re-visit the toilet at a later time.   Thank you for entrusting me with your care and for choosing [facility], Dr. [place] Cellar

## 2023-05-07 ENCOUNTER — Encounter: Payer: Self-pay | Admitting: Family Medicine

## 2023-05-28 ENCOUNTER — Other Ambulatory Visit: Payer: Self-pay | Admitting: Family Medicine

## 2023-05-28 DIAGNOSIS — I10 Essential (primary) hypertension: Secondary | ICD-10-CM

## 2023-07-24 ENCOUNTER — Ambulatory Visit (INDEPENDENT_AMBULATORY_CARE_PROVIDER_SITE_OTHER): Payer: 59 | Admitting: Family Medicine

## 2023-07-24 ENCOUNTER — Encounter: Payer: Self-pay | Admitting: Family Medicine

## 2023-07-24 VITALS — BP 132/84 | HR 104 | Temp 98.1°F | Ht 62.25 in | Wt 214.2 lb

## 2023-07-24 DIAGNOSIS — I10 Essential (primary) hypertension: Secondary | ICD-10-CM

## 2023-07-24 DIAGNOSIS — Z1231 Encounter for screening mammogram for malignant neoplasm of breast: Secondary | ICD-10-CM

## 2023-07-24 DIAGNOSIS — E7841 Elevated Lipoprotein(a): Secondary | ICD-10-CM

## 2023-07-24 DIAGNOSIS — F17210 Nicotine dependence, cigarettes, uncomplicated: Secondary | ICD-10-CM

## 2023-07-24 DIAGNOSIS — N611 Abscess of the breast and nipple: Secondary | ICD-10-CM

## 2023-07-24 DIAGNOSIS — M5432 Sciatica, left side: Secondary | ICD-10-CM

## 2023-07-24 MED ORDER — AMOXICILLIN-POT CLAVULANATE 500-125 MG PO TABS: 1.0000 | ORAL_TABLET | Freq: Two times a day (BID) | ORAL | 0 refills | Status: AC

## 2023-07-24 MED ORDER — PREDNISONE 10 MG PO TABS: ORAL_TABLET | ORAL | 0 refills | Status: DC

## 2023-07-24 MED ORDER — MELOXICAM 7.5 MG PO TABS: 7.5000 mg | ORAL_TABLET | Freq: Every day | ORAL | 0 refills | Status: DC

## 2023-07-24 NOTE — Progress Notes (Signed)
 Established Patient Office Visit   Subjective  Patient ID: Erika Larson, female    DOB: [birth year]  Age: 53 y.o. MRN: [id number]  Chief Complaint  Patient presents with   Follow-up    Patient came in today for a 3 month follow-up     Patient is a 53 year old female seen for follow-up and acute concerns.  Patient endorses unable to schedule mammogram as she could not recall the date of last exam.  Patient notes she had done at [facility] in their clinic.  Patient with draining area under left breast.  Becomes enlarged and drains purulent debris intermittently.  Patient applies warm compresses and uses a Band-Aid over area.  Area currently nontender and without drainage.  Patient with left posterior leg pain from butt downward.  Has Dr. Randon Butters insoles in nonslip shoes for work but stands on hard floors.  Patient tried stretching, Tylenol , and ibuprofen  without improvement in symptoms.  Patient mentions she quit smoking x 3 weeks then restarted last week while caring for her grandchild.  Patient states she tried to smoke today but did not like the taste.    Patient Active Problem List   Diagnosis Date Noted   Status post hysterectomy 02/25/2019   Essential hypertension 04/27/2018   Arthritis 04/27/2018   Cigarette nicotine dependence without complication 04/27/2018   Obesity with body mass index 30 or greater 08/07/2016   Menorrhagia 08/03/2016   Smoker 08/03/2016   Past Medical History:  Diagnosis Date   Allergy    SEASONAL   Anemia    Anxiety    Arthritis    Depression    Gallstones 1998   GERD (gastroesophageal reflux disease)    Hyperlipidemia    Hypertension    Seizures (HCC)    AS A CHILD,BUT NOT AS A ADULT   Past Surgical History:  Procedure Laterality Date   CHOLECYSTECTOMY     CYSTOSCOPY Bilateral 02/25/2019   Procedure: CYSTOSCOPY;  Surgeon: Taam-Akelman, Rosalea K, MD;  Location: [facility];  Service: Gynecology;   Laterality: Bilateral;   ROBOTIC ASSISTED LAPAROSCOPIC HYSTERECTOMY AND SALPINGECTOMY Bilateral 02/25/2019   Procedure: XI ROBOTIC ASSISTED LAPAROSCOPIC HYSTERECTOMY AND SALPINGECTOMY;  Surgeon: Sharen Daubs, MD;  Location: [facility];  Service: Gynecology;  Laterality: Bilateral;   TUBAL LIGATION     WISDOM TOOTH EXTRACTION     Social History   Tobacco Use   Smoking status: Every Day    Current packs/day: 0.25    Types: Cigarettes   Smokeless tobacco: Never  Vaping Use   Vaping status: Never Used  Substance Use Topics   Alcohol use: Yes    Comment: occ   Drug use: No   Family History  Problem Relation Age of Onset   Stomach cancer Mother    Arthritis Mother    Hearing loss Mother    Hypertension Mother    Stomach cancer Sister    Hypertension Sister    Hypertension Sister    Hypertension Sister    Hypertension Sister    Hypertension Sister    Hypertension Brother    Hypertension Brother    Hypertension Brother    Hypertension Brother    Other Daughter        died in MVA   Colon cancer Neg Hx    Colon polyps Neg Hx    Esophageal cancer Neg Hx    Crohn's disease Neg Hx    Rectal cancer Neg Hx    Ulcerative colitis  Neg Hx    Allergies  Allergen Reactions   Shellfish Allergy Anaphylaxis and Swelling   Lisinopril      Dry cough      ROS Negative unless stated above    Objective:     BP 132/84 (BP Location: Left Arm, Patient Position: Sitting, Cuff Size: Large)   Pulse (!) 104   Temp 98.1 F (36.7 C) (Oral)   Ht 5' 2.25" (1.581 m)   Wt 214 lb 3.2 oz (97.2 kg)   LMP 02/23/2019   SpO2 96%   BMI 38.86 kg/m  BP Readings from Last 3 Encounters:  07/24/23 132/84  05/06/23 (!) 132/96  04/25/23 120/80   Wt Readings from Last 3 Encounters:  07/24/23 214 lb 3.2 oz (97.2 kg)  05/06/23 216 lb 2 oz (98 kg)  04/25/23 213 lb 6.4 oz (96.8 kg)     Physical Exam Constitutional:      General: She is not in acute distress.     Appearance: Normal appearance.  HENT:     Head: Normocephalic and atraumatic.     Nose: Nose normal.     Mouth/Throat:     Mouth: Mucous membranes are moist.  Cardiovascular:     Rate and Rhythm: Normal rate and regular rhythm.     Heart sounds: Normal heart sounds. No murmur heard.    No gallop.  Pulmonary:     Effort: Pulmonary effort is normal. No respiratory distress.     Breath sounds: Normal breath sounds. No wheezing, rhonchi or rales.  Musculoskeletal:     Comments: TTP of left sciatic nerve in mid buttock.  Skin:    General: Skin is warm and dry.     Comments: Open abscess underneath left breast lateral to midline.  No purulent drainage or induration noted.  Neurological:     Mental Status: She is alert and oriented to person, place, and time.      No results found for any visits on 07/24/23.    Assessment & Plan:  Sciatica of left side -     predniSONE ; Take 4 tabs every morning for 3 days, 3 tabs for 2 days, 2 tabs for 2 days, 1 tab for 1 day.  Dispense: 23 tablet; Refill: 0 -     Meloxicam ; Take 1 tablet (7.5 mg total) by mouth daily.  Dispense: 30 tablet; Refill: 0  Essential hypertension  Encounter for screening mammogram for malignant neoplasm of breast -     3D Screening Mammogram, Left and Right; Future  Abscess of left breast -     Amoxicillin -Pot Clavulanate; Take 1 tablet by mouth in the morning and at bedtime for 7 days.  Dispense: 14 tablet; Refill: 0  Cigarette nicotine dependence without complication  Patient with acute left-sided sciatica.  Discussed supportive care including stretching, heat, ice, massage, topical analgesics, NSAIDs or Tylenol .  Rx for Mobic  and prednisone  sent to pharmacy.  BP controlled continue current medications.  Start ABX for abscess left breast.  Order for mammogram placed.  Will likely have done after abscess heals.  Patient encouraged to continue smoking cessation.  Offered quit aids.  Patient declines at this  time.  Return if symptoms worsen or fail to improve.   Viola Greulich, MD

## 2023-08-21 ENCOUNTER — Other Ambulatory Visit: Payer: Self-pay | Admitting: Family Medicine

## 2023-08-21 DIAGNOSIS — M5432 Sciatica, left side: Secondary | ICD-10-CM

## 2023-09-11 ENCOUNTER — Encounter: Payer: Self-pay | Admitting: Family Medicine

## 2023-09-11 ENCOUNTER — Ambulatory Visit: Admitting: Family Medicine

## 2023-09-11 VITALS — BP 132/68 | HR 75 | Temp 98.1°F | Ht 62.25 in | Wt 214.6 lb

## 2023-09-11 DIAGNOSIS — M7632 Iliotibial band syndrome, left leg: Secondary | ICD-10-CM | POA: Diagnosis not present

## 2023-09-11 DIAGNOSIS — K641 Second degree hemorrhoids: Secondary | ICD-10-CM | POA: Diagnosis not present

## 2023-09-11 DIAGNOSIS — M79605 Pain in left leg: Secondary | ICD-10-CM | POA: Diagnosis not present

## 2023-09-11 DIAGNOSIS — K640 First degree hemorrhoids: Secondary | ICD-10-CM

## 2023-09-11 DIAGNOSIS — K625 Hemorrhage of anus and rectum: Secondary | ICD-10-CM | POA: Diagnosis not present

## 2023-09-11 MED ORDER — HYDROCORTISONE ACETATE 25 MG RE SUPP: 25.0000 mg | Freq: Two times a day (BID) | RECTAL | 0 refills | Status: DC

## 2023-09-11 NOTE — Progress Notes (Unsigned)
 Established Patient Office Visit   Subjective  Patient ID: Erika Larson, female    DOB: 03-27-1971  Age: 53 y.o. MRN: [id number]  Chief Complaint  Patient presents with   Medical Management of Chronic Issues    Left leg pain, and anal bleeding- started 2 days ago     Patient is a 53 year old female seen for several ongoing concerns.  Patient endorses rectal bleeding and soreness x 2 days.    Patient Active Problem List   Diagnosis Date Noted   Status post hysterectomy 02/25/2019   Essential hypertension 04/27/2018   Arthritis 04/27/2018   Cigarette nicotine dependence without complication 04/27/2018   Obesity with body mass index 30 or greater 08/07/2016   Menorrhagia 08/03/2016   Smoker 08/03/2016   Past Medical History:  Diagnosis Date   Allergy    SEASONAL   Anemia    Anxiety    Arthritis    Depression    Gallstones 1998   GERD (gastroesophageal reflux disease)    Hyperlipidemia    Hypertension    Seizures (HCC)    AS A CHILD,BUT NOT AS A ADULT   Past Surgical History:  Procedure Laterality Date   CHOLECYSTECTOMY     CYSTOSCOPY Bilateral 02/25/2019   Procedure: CYSTOSCOPY;  Surgeon: Taam-Akelman, Rosalea K, MD;  Location: [facility];  Service: Gynecology;  Laterality: Bilateral;   ROBOTIC ASSISTED LAPAROSCOPIC HYSTERECTOMY AND SALPINGECTOMY Bilateral 02/25/2019   Procedure: XI ROBOTIC ASSISTED LAPAROSCOPIC HYSTERECTOMY AND SALPINGECTOMY;  Surgeon: Sharen Daubs, MD;  [facility];  Service: Gynecology;  Laterality: Bilateral;   TUBAL LIGATION     WISDOM TOOTH EXTRACTION     Social History   Tobacco Use   Smoking status: Every Day    Current packs/day: 0.25    Types: Cigarettes   Smokeless tobacco: Never  Vaping Use   Vaping status: Never Used  Substance Use Topics   Alcohol use: Yes    Comment: occ   Drug use: No   Family History  Problem Relation Age of Onset   Stomach cancer Mother     Arthritis Mother    Hearing loss Mother    Hypertension Mother    Stomach cancer Sister    Hypertension Sister    Hypertension Sister    Hypertension Sister    Hypertension Sister    Hypertension Sister    Hypertension Brother    Hypertension Brother    Hypertension Brother    Hypertension Brother    Other Daughter        died in MVA   Colon cancer Neg Hx    Colon polyps Neg Hx    Esophageal cancer Neg Hx    Crohn's disease Neg Hx    Rectal cancer Neg Hx    Ulcerative colitis Neg Hx    Allergies  Allergen Reactions   Shellfish Allergy Anaphylaxis and Swelling   Lisinopril      Dry cough    ROS Negative unless stated above    Objective:      BP 132/68 (BP Location: Left Arm, Patient Position: Sitting, Cuff Size: Normal)   Pulse 75   Temp 98.1 F (36.7 C) (Oral)   Ht 5' 2.25 (1.581 m)   Wt 214 lb 9.6 oz (97.3 kg)   LMP 02/23/2019   SpO2 98%   BMI 38.94 kg/m  BP Readings from Last 3 Encounters:  09/11/23 132/68  07/24/23 132/84  05/06/23 (!) 132/96   Wt Readings from Last  3 Encounters:  09/11/23 214 lb 9.6 oz (97.3 kg)  07/24/23 214 lb 3.2 oz (97.2 kg)  05/06/23 216 lb 2 oz (98 kg)      Physical Exam Genitourinary:    Rectum: Guaiac result negative. Mass and external hemorrhoid present. No anal fissure. Normal anal tone.     Comments: Guaiac negative.       07/24/2023    3:43 PM 04/25/2023    1:14 PM 04/23/2022    1:12 PM  Depression screen PHQ 2/9  Decreased Interest 0 3 3  Down, Depressed, Hopeless 0 0 1  PHQ - 2 Score 0 3 4  Altered sleeping 0 1 1  Tired, decreased energy 0 1 1  Change in appetite 0 0 1  Feeling bad or failure about yourself  0 0 1  Trouble concentrating 0 1 1  Moving slowly or fidgety/restless 0 0 0  Suicidal thoughts 0 0 0  PHQ-9 Score 0 6 9  Difficult doing work/chores   Somewhat difficult      07/24/2023    3:43 PM 04/25/2023    1:14 PM 06/09/2020    3:33 PM 04/28/2018    5:45 PM  GAD 7 : Generalized Anxiety  Score  Nervous, Anxious, on Edge 0 1 1 0  Control/stop worrying 0 1 1 1   Worry too much - different things 0 1 1 1   Trouble relaxing 0 1 1 1   Restless 0 0 1 0  Easily annoyed or irritable 0 1 1 0  Afraid - awful might happen 0 1 1 1   Total GAD 7 Score 0 6 7 4   Anxiety Difficulty  Not difficult at all       No results found for any visits on 09/11/23.    Assessment & Plan:   Grade I hemorrhoids -     Hydrocortisone  Acetate; Place 1 suppository (25 mg total) rectally 2 (two) times daily.  Dispense: 12 suppository; Refill: 0  Rectal bleeding  It band syndrome, left  Pain of left anterior lower extremity    Return if symptoms worsen or fail to improve.   Viola Greulich, MD

## 2023-09-12 ENCOUNTER — Telehealth: Payer: Self-pay

## 2023-09-12 NOTE — Telephone Encounter (Signed)
 Copied from CRM [id number]. Topic: Clinical - Medication Question >> Sep 12, 2023  2:34 PM [name] wrote: Reason for CRM: Pt is calling to inform Dr.Banks that her insurance didn't approve the hydrocortisone (ANUSOL-HC) 25 MG suppository and would like to know if she could prescribe an alternative medication. Pt would like to receive a callback regarding this concern.

## 2023-09-13 NOTE — Telephone Encounter (Signed)
 Called patient left a VM per DPR,  per Dr. Arliss Lam insurance may not cover any other medication, the patient can try, tucks pads, Preparation H,  and sitz bath

## 2023-09-17 ENCOUNTER — Ambulatory Visit: Payer: Self-pay

## 2023-09-17 NOTE — Telephone Encounter (Signed)
 Spoke to husband per DPR, they are asking for Medication for leg pain, and if the patient is able to go out on disability due to the leg pain

## 2023-09-17 NOTE — Telephone Encounter (Addendum)
 First attempt; no answer.  Second attempt; no answer. Third attempt; no answer.  Forwarded to office.   Copied from CRM [id number]. Topic: Clinical - Medical Advice >> Sep 17, 2023 10:36 AM Alyse July wrote: Reason for CRM: Patient spouse would like a call back to discuss his wife thyroid  nerves in her leg CB# [phone].

## 2023-09-19 MED ORDER — GABAPENTIN 100 MG PO CAPS: 100.0000 mg | ORAL_CAPSULE | Freq: Two times a day (BID) | ORAL | 0 refills | Status: DC

## 2023-09-20 NOTE — Telephone Encounter (Signed)
 Prescription was sent to pharmacy earlier this week.  Unable to take patient out of work/complete disability forms for recent leg pain.

## 2023-09-27 ENCOUNTER — Other Ambulatory Visit: Payer: Self-pay | Admitting: Family Medicine

## 2023-09-29 ENCOUNTER — Other Ambulatory Visit: Payer: Self-pay | Admitting: Family Medicine

## 2023-09-29 DIAGNOSIS — M5432 Sciatica, left side: Secondary | ICD-10-CM

## 2023-09-30 ENCOUNTER — Other Ambulatory Visit: Payer: Self-pay | Admitting: Family Medicine

## 2023-09-30 DIAGNOSIS — M5416 Radiculopathy, lumbar region: Secondary | ICD-10-CM

## 2023-09-30 DIAGNOSIS — M79605 Pain in left leg: Secondary | ICD-10-CM

## 2023-10-01 ENCOUNTER — Other Ambulatory Visit: Payer: Self-pay | Admitting: Family Medicine

## 2023-10-01 DIAGNOSIS — M79605 Pain in left leg: Secondary | ICD-10-CM

## 2023-10-01 NOTE — Addendum Note (Signed)
 Addended by: BRIEN SONG A on: 10/01/2023 08:06 AM   Modules accepted: Orders

## 2023-10-02 ENCOUNTER — Ambulatory Visit
Admission: RE | Admit: 2023-10-02 | Discharge: 2023-10-02 | Disposition: A | Discharge status: Home / Self Care | Source: Ambulatory Visit | Attending: Family Medicine | Admitting: Family Medicine

## 2023-10-02 DIAGNOSIS — M79605 Pain in left leg: Secondary | ICD-10-CM

## 2023-10-02 DIAGNOSIS — M5416 Radiculopathy, lumbar region: Secondary | ICD-10-CM

## 2023-10-11 ENCOUNTER — Ambulatory Visit: Payer: Self-pay | Admitting: Family Medicine

## 2023-10-11 DIAGNOSIS — M48061 Spinal stenosis, lumbar region without neurogenic claudication: Secondary | ICD-10-CM

## 2023-10-11 DIAGNOSIS — M5416 Radiculopathy, lumbar region: Secondary | ICD-10-CM

## 2023-10-20 NOTE — Therapy (Incomplete)
 OUTPATIENT PHYSICAL THERAPY THORACOLUMBAR EVALUATION   Patient Name: Erika Larson MRN: [id number] DOB:[birth year], 53 y.o., female Today's Date: 10/20/2023  END OF SESSION:   Past Medical History:  Diagnosis Date   Allergy    SEASONAL   Anemia    Anxiety    Arthritis    Depression    Gallstones 1998   GERD (gastroesophageal reflux disease)    Hyperlipidemia    Hypertension    Seizures (HCC)    AS A CHILD,BUT NOT AS A ADULT   Past Surgical History:  Procedure Laterality Date   CHOLECYSTECTOMY     CYSTOSCOPY Bilateral 02/25/2019   Procedure: CYSTOSCOPY;  Surgeon: Taam-Akelman, Rosalea K, MD;  Location: [facility];  Service: Gynecology;  Laterality: Bilateral;   ROBOTIC ASSISTED LAPAROSCOPIC HYSTERECTOMY AND SALPINGECTOMY Bilateral 02/25/2019   Procedure: XI ROBOTIC ASSISTED LAPAROSCOPIC HYSTERECTOMY AND SALPINGECTOMY;  Surgeon: Bonnielee Rayleen POUR, MD;  Location: [facility];  Service: Gynecology;  Laterality: Bilateral;   TUBAL LIGATION     WISDOM TOOTH EXTRACTION     Patient Active Problem List   Diagnosis Date Noted   Status post hysterectomy 02/25/2019   Essential hypertension 04/27/2018   Arthritis 04/27/2018   Cigarette nicotine dependence without complication 04/27/2018   Obesity with body mass index 30 or greater 08/07/2016   Menorrhagia 08/03/2016   Smoker 08/03/2016    PCP: Mercer Clotilda SAUNDERS, MD  REFERRING PROVIDER:    REFERRING DIAG:M54.16 (ICD-10-CM) - Lumbar radiculopathy, acute M48.061 (ICD-10-CM) - Lumbar foraminal stenosis  Rationale for Evaluation and Treatment: Rehabilitation  THERAPY DIAG:  No diagnosis found.  ONSET DATE: ***  SUBJECTIVE:                                                                                                                                                                                           SUBJECTIVE STATEMENT: ***  PERTINENT HISTORY:  ***  PAIN:  Are you  having pain? {OPRCPAIN:27236}  PRECAUTIONS: {Therapy precautions:24002}  RED FLAGS: {PT Red Flags:29287}   WEIGHT BEARING RESTRICTIONS: {Yes ***/No:24003}  FALLS:  Has patient fallen in last 6 months? {fallsyesno:27318}  LIVING ENVIRONMENT: Lives with: {OPRC lives with:25569::lives with their family} Lives in: {Lives in:25570} Stairs: {opstairs:27293} Has following equipment at home: {Assistive devices:23999}  OCCUPATION: ***  PLOF: {PLOF:24004}  PATIENT GOALS: ***  NEXT MD VISIT: ***  OBJECTIVE:  Note: Objective measures were completed at Evaluation unless otherwise noted.  DIAGNOSTIC FINDINGS:  ***  PATIENT SURVEYS:  {rehab surveys:24030}  COGNITION: Overall cognitive status: {cognition:24006}     SENSATION: {sensation:27233}  MUSCLE LENGTH: Hamstrings: Right *** deg; Left *** deg Debby test: Right ***  deg; Left *** deg  POSTURE: {posture:25561}  PALPATION: ***  LUMBAR ROM:   AROM eval  Flexion   Extension   Right lateral flexion   Left lateral flexion   Right rotation   Left rotation    (Blank rows = not tested)  LOWER EXTREMITY ROM:     {AROM/PROM:27142}  Right eval Left eval  Hip flexion    Hip extension    Hip abduction    Hip adduction    Hip internal rotation    Hip external rotation    Knee flexion    Knee extension    Ankle dorsiflexion    Ankle plantarflexion    Ankle inversion    Ankle eversion     (Blank rows = not tested)  LOWER EXTREMITY MMT:    MMT Right eval Left eval  Hip flexion    Hip extension    Hip abduction    Hip adduction    Hip internal rotation    Hip external rotation    Knee flexion    Knee extension    Ankle dorsiflexion    Ankle plantarflexion    Ankle inversion    Ankle eversion     (Blank rows = not tested)  LUMBAR SPECIAL TESTS:  {lumbar special test:25242}  FUNCTIONAL TESTS:  {Functional tests:24029}  GAIT: Distance walked: *** Assistive device utilized: {Assistive  devices:23999} Level of assistance: {Levels of assistance:24026} Comments: ***  TREATMENT DATE: ***                                                                                                                                 PATIENT EDUCATION:  Education details: *** Person educated: {Person educated:25204} Education method: {Education Method:25205} Education comprehension: {Education Comprehension:25206}  HOME EXERCISE PROGRAM: ***  ASSESSMENT:  CLINICAL IMPRESSION: Patient is a *** y.o. *** who was seen today for physical therapy evaluation and treatment for ***.   OBJECTIVE IMPAIRMENTS: {opptimpairments:25111}.   ACTIVITY LIMITATIONS: {activitylimitations:27494}  PARTICIPATION LIMITATIONS: {participationrestrictions:25113}  PERSONAL FACTORS: {Personal factors:25162} are also affecting patient's functional outcome.   REHAB POTENTIAL: {rehabpotential:25112}  CLINICAL DECISION MAKING: {clinical decision making:25114}  EVALUATION COMPLEXITY: {Evaluation complexity:25115}   GOALS: Goals reviewed with patient? {yes/no:20286}  SHORT TERM GOALS: Target date: ***  *** Baseline: Goal status: INITIAL  2.  *** Baseline:  Goal status: INITIAL  3.  *** Baseline:  Goal status: INITIAL  4.  *** Baseline:  Goal status: INITIAL  5.  *** Baseline:  Goal status: INITIAL  6.  *** Baseline:  Goal status: INITIAL  LONG TERM GOALS: Target date: ***  *** Baseline:  Goal status: INITIAL  2.  *** Baseline:  Goal status: INITIAL  3.  *** Baseline:  Goal status: INITIAL  4.  *** Baseline:  Goal status: INITIAL  5.  *** Baseline:  Goal status: INITIAL  6.  *** Baseline:  Goal status: INITIAL  PLAN:  PT FREQUENCY: {rehab frequency:25116}  PT DURATION: {  rehab duration:25117}  PLANNED INTERVENTIONS: {rehab planned interventions:25118::97110-Therapeutic exercises,97530- Therapeutic [phone]- Neuromuscular re-education,97535- Self  Rjmz,02859- Manual therapy}.  PLAN FOR NEXT SESSION: ***   Jerre Vandrunen, PT 10/20/2023, 3:53 PM

## 2023-10-21 ENCOUNTER — Encounter: Payer: Self-pay | Admitting: Rehabilitative and Restorative Service Providers"

## 2023-10-21 ENCOUNTER — Ambulatory Visit: Attending: Family Medicine | Admitting: Rehabilitative and Restorative Service Providers"

## 2023-10-21 ENCOUNTER — Other Ambulatory Visit: Payer: Self-pay

## 2023-10-21 DIAGNOSIS — M48061 Spinal stenosis, lumbar region without neurogenic claudication: Secondary | ICD-10-CM | POA: Insufficient documentation

## 2023-10-21 DIAGNOSIS — M6281 Muscle weakness (generalized): Secondary | ICD-10-CM | POA: Diagnosis present

## 2023-10-21 DIAGNOSIS — R252 Cramp and spasm: Secondary | ICD-10-CM | POA: Insufficient documentation

## 2023-10-21 DIAGNOSIS — R2689 Other abnormalities of gait and mobility: Secondary | ICD-10-CM | POA: Insufficient documentation

## 2023-10-21 DIAGNOSIS — M5459 Other low back pain: Secondary | ICD-10-CM | POA: Diagnosis present

## 2023-10-21 DIAGNOSIS — M5416 Radiculopathy, lumbar region: Secondary | ICD-10-CM | POA: Insufficient documentation

## 2023-10-21 NOTE — Therapy (Signed)
 OUTPATIENT PHYSICAL THERAPY THORACOLUMBAR EVALUATION   Patient Name: Erika Larson MRN: [id number] DOB:Jan 10, 1971, 53 y.o., female Today's Date: 10/21/2023  END OF SESSION:  PT End of Session - 10/21/23 0948     Visit Number 1    Date for PT Re-Evaluation 12/13/23    Authorization Type [employer]    PT Start Time 0930    PT Stop Time 1010    PT Time Calculation (min) 40 min    Activity Tolerance Patient tolerated treatment well    Behavior During Therapy WFL for tasks assessed/performed          Past Medical History:  Diagnosis Date   Allergy    SEASONAL   Anemia    Anxiety    Arthritis    Depression    Gallstones 1998   GERD (gastroesophageal reflux disease)    Hyperlipidemia    Hypertension    Seizures (HCC)    AS A CHILD,BUT NOT AS A ADULT   Past Surgical History:  Procedure Laterality Date   CHOLECYSTECTOMY     CYSTOSCOPY Bilateral 02/25/2019   Procedure: CYSTOSCOPY;  Surgeon: Taam-Akelman, Rosalea K, MD;  Location: [facility];  Service: Gynecology;  Laterality: Bilateral;   ROBOTIC ASSISTED LAPAROSCOPIC HYSTERECTOMY AND SALPINGECTOMY Bilateral 02/25/2019   Procedure: XI ROBOTIC ASSISTED LAPAROSCOPIC HYSTERECTOMY AND SALPINGECTOMY;  Surgeon: Bonnielee Rayleen POUR, MD;  Location: [facility];  Service: Gynecology;  Laterality: Bilateral;   TUBAL LIGATION     WISDOM TOOTH EXTRACTION     Patient Active Problem List   Diagnosis Date Noted   Status post hysterectomy 02/25/2019   Essential hypertension 04/27/2018   Arthritis 04/27/2018   Cigarette nicotine dependence without complication 04/27/2018   Obesity with body mass index 30 or greater 08/07/2016   Menorrhagia 08/03/2016   Smoker 08/03/2016    PCP: Mercer Clotilda SAUNDERS, MD  REFERRING PROVIDER: Mercer Clotilda SAUNDERS, MD  REFERRING DIAG: M54.16 (ICD-10-CM) - Lumbar radiculopathy, acute M48.061 (ICD-10-CM) - Lumbar foraminal stenosis  Rationale for Evaluation and  Treatment: Rehabilitation  THERAPY DIAG:  Other low back pain  Muscle weakness (generalized)  Cramp and spasm  Other abnormalities of gait and mobility  ONSET DATE: May 2025  SUBJECTIVE:                                                                                                                                                                                           SUBJECTIVE STATEMENT: 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  Patient was helping unload the deliver truck and that night, she  started having some pain in her knees and down her left leg.  Pt present with spouse, [name]  PERTINENT HISTORY:  OA, GERD  PAIN:  Are you having pain? Yes: NPRS scale: 5-10/10 Pain location: low back and down left leg Pain description: when it hits, it just hits.  Shocking radiating pain Aggravating factors: walking, lifting, standing Relieving factors: unknown  PRECAUTIONS: None  RED FLAGS: None   WEIGHT BEARING RESTRICTIONS: No  FALLS:  Has patient fallen in last 6 months? 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  NEXT MD VISIT: as needed pending progress with PT  OBJECTIVE:  Note: Objective measures were completed at Evaluation unless otherwise noted.  DIAGNOSTIC FINDINGS:  Lumbar MRI on 10/02/2023: IMPRESSION: 1. At L5-S1, moderate left and mild right foraminal stenosis. 2. At L4-L5, mild left foraminal stenosis with left foraminal and extraforaminal disc protrusion closely approximates the exiting/exited left L4 nerve. 3. At L3-L4, mild canal and bilateral subarticular recess stenosis.  PATIENT SURVEYS:  Modified Oswestry:   MODIFIED OSWESTRY DISABILITY SCALE  Score Date: 10/21/23  Pain intensity 5 =  Pain medication has no effect on my pain.  2. Personal care (washing, dressing, etc.) 2 =  It is painful to take care of myself, and I am slow and careful.  3. Lifting 4 = I can lift only very light weights  4. Walking 3 =  Pain prevents me from walking more than  mile.  5. Sitting 2 =  Pain prevents me from sitting more than 1 hour.  6. Standing 4 =  Pain prevents me from standing more than 10 minutes.  7. Sleeping 5 =  Pain prevents me from sleeping at all.  8. Social Life 3 =  Pain prevents me from going out very often.  9. Traveling 3 = My pain restricts my travel over 1 hour  10. Employment/ Homemaking 4 = Pain prevents me from doing even light duties.  Total 35/50 = 70%   Interpretation of scores: Score Category Description  0-20% Minimal Disability The patient can cope with most living activities. Usually no treatment is indicated apart from advice on lifting, sitting and exercise  21-40% Moderate Disability The patient experiences more pain and difficulty with sitting, lifting and standing. Travel and social life are more difficult and they may be disabled from work. Personal care, sexual activity and sleeping are not grossly affected, and the patient can usually be managed by conservative means  41-60% Severe Disability Pain remains the main problem in this group, but activities of daily living are affected. These patients require a detailed investigation  61-80% Crippled Back pain impinges on all aspects of the patient's life. Positive intervention is required  81-100% Bed-bound  These patients are either bed-bound or exaggerating their symptoms  Bluford FORBES [name], et al. Surgery versus conservative management of stable thoracolumbar fracture: the PRESTO feasibility RCT. [place]): [employer]; 2021 Nov. [facility] Technology Assessment, No. 25.62.) Appendix 3, Oswestry Disability  Index category descriptors. Available from: FindJewelers.cz  Minimally Clinically Important Difference (MCID) = 12.8%  COGNITION: Overall cognitive status: Within functional limits for tasks assessed     SENSATION: Patient reports numbness and tingling down left leg  MUSCLE LENGTH: Hamstrings: Tightness bilateral (  left more impaired than right)  POSTURE: rounded shoulders and forward head  PALPATION: Tightness and muscle spasms along cervical paraspinals, upper trap, and lumbar paraspinals  LUMBAR ROM:   Decreased at least 50% with increased pain  LOWER EXTREMITY ROM:     WFL  LOWER EXTREMITY MMT:    Eval: Left hip strength of 4/5 Right LE strength is WFL  LUMBAR SPECIAL TESTS:  Slump test: positive on the left side  FUNCTIONAL TESTS:  Eval: 5 times sit to stand: 13.37 sec Timed up and go (TUG): 9.34 sec  GAIT: Distance walked: >500 ft Assistive device utilized: None Level of assistance: Complete Independence Comments: Patient states that she starts having increased pain with ambulation  TREATMENT DATE:  10/21/2023: Reviewed goal and purposes of PT Issued and reviewed HEP                                                                                                                                PATIENT EDUCATION:  Education details: Issued HEP Person educated: Patient and Spouse Education method: Explanation, Demonstration, and Handouts Education comprehension: verbalized understanding and returned demonstration  HOME EXERCISE PROGRAM: Access Code: [email or web address] URL: [email or web address] Date: 10/21/2023 Prepared by: Jarrell Laming  Exercises - Gastroc Stretch with Foot at Wall  - 1 x daily - 7 x weekly - 2 reps - 20 sec hold - Seated Scapular Retraction  - 1 x daily - 7 x weekly - 2 sets - 10 reps - Seated Hamstring Stretch  - 1 x daily - 7 x weekly - 2 reps - Seated Piriformis Stretch with Trunk Bend  - 1  x daily - 7 x weekly - 2 reps - 20 sec hold - Supine Lower Trunk Rotation  - 1 x daily - 7 x weekly - 1-2 sets - 10 reps - Supine Posterior Pelvic Tilt  - 1 x daily - 7 x weekly - 2 sets - 10 reps  ASSESSMENT:  CLINICAL IMPRESSION: Patient is a 53 y.o. female who was seen today for physical therapy evaluation and treatment for lumbar radiculopathy and lumbar foraminal stenosis.  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  She reports that she started noticing the pain at the end of the day on night after she had been assisting with unloading a truck delivery at work.  Patient states that she had continued to have intermittent pain at this time.  She states that recently, she is unable to do lifting without pain and her husband has been assisting with more activities in the home. Patient presents with some left hip weakness, muscle spasms, increased pain, and difficulty performing functional tasks.  Patient additionally states that she has been having some cervical pain recently, as well, but her back pain is more severe.  Patient continues to require skilled PT to progress towards goal related activities.  OBJECTIVE IMPAIRMENTS: decreased balance, decreased coordination, difficulty walking, decreased  strength, impaired flexibility, postural dysfunction, and pain.   ACTIVITY LIMITATIONS: carrying, lifting, bending, sitting, standing, squatting, and sleeping  PARTICIPATION LIMITATIONS: meal prep, cleaning, laundry, community activity, and occupation  PERSONAL FACTORS: Time since onset of injury/illness/exacerbation and 1 comorbidity: OA are also affecting patient's functional outcome.   REHAB POTENTIAL: Good  CLINICAL DECISION MAKING: Stable/uncomplicated  EVALUATION COMPLEXITY: Low   GOALS: Goals reviewed with patient? Yes  SHORT TERM GOALS: Target date: 11/13/23  Patient will be independent with initial HEP. Baseline: Goal status: INITIAL  2.   Patient will participate in a 6 minute walk test to establish a baseline measurement. Baseline:  Goal status: INITIAL   LONG TERM GOALS: Target date: 12/13/2023  Patient will be independent with advanced HEP to allow for self progression after discharge. Baseline:  Goal status: INITIAL  2.  Patient will improve modified Oswestry to no greater than 55% to demonstrate improvements in functional tasks. Baseline: 70% Goal status: INITIAL  3.  Patient will increase left hip strength to [facility] to allow patient to navigate a curb and stairs. Baseline:  Goal status: INITIAL  4.  Patient will report ability to work a shift of work pain no greater than 4/10. Baseline:  Goal status: INITIAL  5.  Patient will be able to demonstrate proper posture and body mechanics to be able to lift without increased pain. Baseline:  Goal status: INITIAL    PLAN:  PT FREQUENCY: 1-2x/week  PT DURATION: 8 weeks  PLANNED INTERVENTIONS: 97164- PT Re-evaluation, 97110-Therapeutic exercises, 97530- Therapeutic activity, [id number]- Neuromuscular re-education, 97535- Self Care, 02859- Manual therapy, [id number]- Gait training, [phone]- Canalith repositioning, [id number]- Aquatic Therapy, [phone]- Electrical stimulation (unattended), [phone]- Electrical stimulation (manual), [id number]- Vasopneumatic device, [id number]- Ultrasound, [id number]- Traction (mechanical), [id number]- Ionotophoresis 4mg /ml Dexamethasone , 79439 (1-2 muscles), 20561 (3+ muscles)- Dry Needling, Patient/Family education, Balance training, Stair training, Taping, Joint mobilization, Joint manipulation, Spinal manipulation, Spinal mobilization, Vestibular training, Cryotherapy, and Moist heat.  PLAN FOR NEXT SESSION: Assess and progress HEP as indicated, strengthening, flexibility, manual/dry needling as indicated    Jarrell Laming, PT, DPT 10/21/23, 9:49 AM  [facility] [address], [address] [place], [place] [address] Phone # [phone] Fax  [phone]

## 2023-10-28 ENCOUNTER — Other Ambulatory Visit: Payer: Self-pay | Admitting: Family Medicine

## 2023-10-28 DIAGNOSIS — M5432 Sciatica, left side: Secondary | ICD-10-CM

## 2023-10-29 ENCOUNTER — Encounter: Payer: Self-pay | Admitting: Rehabilitative and Restorative Service Providers"

## 2023-10-29 ENCOUNTER — Ambulatory Visit: Admitting: Rehabilitative and Restorative Service Providers"

## 2023-10-29 ENCOUNTER — Telehealth: Payer: Self-pay | Admitting: Family Medicine

## 2023-10-29 DIAGNOSIS — M5459 Other low back pain: Secondary | ICD-10-CM | POA: Diagnosis not present

## 2023-10-29 DIAGNOSIS — R2689 Other abnormalities of gait and mobility: Secondary | ICD-10-CM

## 2023-10-29 DIAGNOSIS — M6281 Muscle weakness (generalized): Secondary | ICD-10-CM

## 2023-10-29 DIAGNOSIS — R252 Cramp and spasm: Secondary | ICD-10-CM

## 2023-10-29 NOTE — Therapy (Signed)
 OUTPATIENT PHYSICAL THERAPY TREATMENT NOTE   Patient Name: Erika Larson MRN: [id number] DOB:Mar 17, 1971, 53 y.o., female Today's Date: 10/29/2023  END OF SESSION:  PT End of Session - 10/29/23 1017     Visit Number 2    Date for PT Re-Evaluation 12/13/23    Authorization Type [employer]    PT Start Time 1015    PT Stop Time 1055    PT Time Calculation (min) 40 min    Activity Tolerance Patient tolerated treatment well    Behavior During Therapy WFL for tasks assessed/performed          Past Medical History:  Diagnosis Date   Allergy    SEASONAL   Anemia    Anxiety    Arthritis    Depression    Gallstones 1998   GERD (gastroesophageal reflux disease)    Hyperlipidemia    Hypertension    Seizures (HCC)    AS A CHILD,BUT NOT AS A ADULT   Past Surgical History:  Procedure Laterality Date   CHOLECYSTECTOMY     CYSTOSCOPY Bilateral 02/25/2019   Procedure: CYSTOSCOPY;  Surgeon: Taam-Akelman, Rosalea K, MD;  Location: [facility];  Service: Gynecology;  Laterality: Bilateral;   ROBOTIC ASSISTED LAPAROSCOPIC HYSTERECTOMY AND SALPINGECTOMY Bilateral 02/25/2019   Procedure: XI ROBOTIC ASSISTED LAPAROSCOPIC HYSTERECTOMY AND SALPINGECTOMY;  Surgeon: Bonnielee Rayleen POUR, MD;  Location: [facility];  Service: Gynecology;  Laterality: Bilateral;   TUBAL LIGATION     WISDOM TOOTH EXTRACTION     Patient Active Problem List   Diagnosis Date Noted   Status post hysterectomy 02/25/2019   Essential hypertension 04/27/2018   Arthritis 04/27/2018   Cigarette nicotine dependence without complication 04/27/2018   Obesity with body mass index 30 or greater 08/07/2016   Menorrhagia 08/03/2016   Smoker 08/03/2016    PCP: Erika Clotilda SAUNDERS, MD  REFERRING PROVIDER: Mercer Clotilda SAUNDERS, MD  REFERRING DIAG: M54.16 (ICD-10-CM) - Lumbar radiculopathy, acute M48.061 (ICD-10-CM) - Lumbar foraminal stenosis  Rationale for Evaluation and Treatment:  Rehabilitation  THERAPY DIAG:  Other low back pain  Muscle weakness (generalized)  Cramp and spasm  Other abnormalities of gait and mobility  ONSET DATE: May 2025  SUBJECTIVE:                                                                                                                                                                                           SUBJECTIVE STATEMENT: Patient states that she is trying to reach out to her MD about getting FMLA paperwork filled out.  She states that she walked a lot over the weekend.  Pt present  with spouse, Erika Larson  PERTINENT HISTORY:  OA, GERD  PAIN:  Are you having pain? Yes: NPRS scale: 4-5/10 Pain location: low back and down left leg Pain description: when it hits, it just hits.  Shocking radiating pain Aggravating factors: walking, lifting, standing Relieving factors: unknown  PRECAUTIONS: None  RED FLAGS: None   WEIGHT BEARING RESTRICTIONS: No  FALLS:  Has patient fallen in last 6 months? 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  NEXT MD VISIT: as needed pending progress with PT  OBJECTIVE:  Note: Objective measures were completed at Evaluation unless otherwise noted.  DIAGNOSTIC FINDINGS:  Lumbar MRI on 10/02/2023: IMPRESSION: 1. At L5-S1, moderate left and mild right foraminal stenosis. 2. At L4-L5, mild left foraminal stenosis with left foraminal and extraforaminal disc protrusion closely approximates the exiting/exited left L4 nerve. 3. At L3-L4, mild canal and bilateral subarticular recess stenosis.  PATIENT SURVEYS:  Modified Oswestry:  MODIFIED OSWESTRY DISABILITY SCALE  Score Date: 10/21/23  Pain intensity 5 =  Pain medication has  no effect on my pain.  2. Personal care (washing, dressing, etc.) 2 =  It is painful to take care of myself, and I am slow and careful.  3. Lifting 4 = I can lift only very light weights  4. Walking 3 =  Pain prevents me from walking more than  mile.  5. Sitting 2 =  Pain prevents me from sitting more than 1 hour.  6. Standing 4 =  Pain prevents me from standing more than 10 minutes.  7. Sleeping 5 =  Pain prevents me from sleeping at all.  8. Social Life 3 =  Pain prevents me from going out very often.  9. Traveling 3 = My pain restricts my travel over 1 hour  10. Employment/ Homemaking 4 = Pain prevents me from doing even light duties.  Total 35/50 = 70%   Interpretation of scores: Score Category Description  0-20% Minimal Disability The patient can cope with most living activities. Usually no treatment is indicated apart from advice on lifting, sitting and exercise  21-40% Moderate Disability The patient experiences more pain and difficulty with sitting, lifting and standing. Travel and social life are more difficult and they may be disabled from work. Personal care, sexual activity and sleeping are not grossly affected, and the patient can usually be managed by conservative means  41-60% Severe Disability Pain remains the main problem in this group, but activities of daily living are affected. These patients require a detailed investigation  61-80% Crippled Back pain impinges on all aspects of the patient's life. Positive intervention is required  81-100% Bed-bound  These patients are either bed-bound or exaggerating their symptoms  Bluford FORBES [name], et al. Surgery versus conservative management of stable thoracolumbar fracture: the PRESTO feasibility RCT. [place] ([place]): [employer]; 2021 Nov. [facility] Technology Assessment, No. 25.62.) Appendix 3, Oswestry Disability Index category descriptors. Available from:  FindJewelers.cz  Minimally Clinically Important Difference (MCID) = 12.8%  COGNITION: Overall cognitive status: Within functional limits for tasks assessed     SENSATION: Patient reports numbness and tingling down left leg  MUSCLE LENGTH: Hamstrings: Tightness bilateral (left more impaired than right)  POSTURE: rounded shoulders and forward head  PALPATION: Tightness  and muscle spasms along cervical paraspinals, upper trap, and lumbar paraspinals  LUMBAR ROM:   Decreased at least 50% with increased pain  LOWER EXTREMITY ROM:     WFL  LOWER EXTREMITY MMT:    Eval: Left hip strength of 4/5 Right LE strength is WFL  LUMBAR SPECIAL TESTS:  Slump test: positive on the left side  FUNCTIONAL TESTS:  Eval: 5 times sit to stand: 13.37 sec Timed up and go (TUG): 9.34 sec  10/29/2023: 6 minute walk test:  1,137 ft with no increase of pain (age related norms 1,617 ft)  GAIT: Distance walked: >500 ft Assistive device utilized: None Level of assistance: Complete Independence Comments: Patient states that she starts having increased pain with ambulation  TREATMENT DATE:  10/29/2023: Nustep level 3 x6 min with PT present to discuss status Seated hamstring stretch 2x20 sec bilat Seated piriformis stretch 2x20 sec bilat Seated scapular retraction x10 with 5 sec hold Seated transversus abdominus contraction by pressing ball into thighs with hands 2x10 Seated lumbo-thoracic extension with ball behind back 2x10 Seated hip abduction clamshells with red loop 2x10 Seated marching with red loop around thighs 2x10 6 minute walk test:  1,137 ft with no increase of pain Standing calf stretch on slanted rocker board x20 sec Standing lumbar extension x5 with patient reporting less radicular leg pain following   10/21/2023: Reviewed goal and purposes of PT Issued and reviewed HEP                                                                                                                                 PATIENT EDUCATION:  Education details: Issued HEP Person educated: Patient and Spouse Education method: Explanation, Demonstration, and Handouts Education comprehension: verbalized understanding and returned demonstration  HOME EXERCISE PROGRAM: Access Code: [email or web address] URL: [email or web address] Date: 10/29/2023 Prepared by: Jarrell Damaso Laday  Exercises - Gastroc Stretch with Foot at Wall  - 1 x daily - 7 x weekly - 2 reps - 20 sec hold - Seated Scapular Retraction  - 1 x daily - 7 x weekly - 2 sets - 10 reps - Seated Hamstring Stretch  - 1 x daily - 7 x weekly - 2 reps - Seated Piriformis Stretch with Trunk Bend  - 1 x daily - 7 x weekly - 2 reps - 20 sec hold - Supine Lower Trunk Rotation  - 1 x daily - 7 x weekly - 1-2 sets - 10 reps - Supine Posterior Pelvic Tilt  - 1 x daily - 7 x weekly - 2 sets - 10 reps - Seated Transversus Abdominis Bracing  - 1 x daily - 7 x weekly - 2 sets - 10 reps - Standing Lumbar Extension  - 1 x daily - 7 x weekly - 1-2 sets - 10 reps  ASSESSMENT:  CLINICAL IMPRESSION: Ms Osorto presents to skilled PT reporting that she has been doing the exercises and that she can feel them helping.  Patient able to progress through session with initial review of HEP with only minimal cuing for technique.  Patient reports decreased pain with seated thoracic extension with ball behind back.  Patient educated about the importance of movement and not staying in her recliner most of the day, she verbalizes her understanding.  Patient able to participate in a 6 minute walk test to establish a baseline.  Patient provided with updated HEP, as she reported decreased radicular leg pain with lumbar extension.  Patient continues to require skilled PT to progress towards goal related activities.  OBJECTIVE IMPAIRMENTS: decreased balance, decreased coordination, difficulty walking, decreased strength, impaired flexibility,  postural dysfunction, and pain.   ACTIVITY LIMITATIONS: carrying, lifting, bending, sitting, standing, squatting, and sleeping  PARTICIPATION LIMITATIONS: meal prep, cleaning, laundry, community activity, and occupation  PERSONAL FACTORS: Time since onset of injury/illness/exacerbation and 1 comorbidity: OA are also affecting patient's functional outcome.   REHAB POTENTIAL: Good  CLINICAL DECISION MAKING: Stable/uncomplicated  EVALUATION COMPLEXITY: Low   GOALS: Goals reviewed with patient? Yes  SHORT TERM GOALS: Target date: 11/13/23  Patient will be independent with initial HEP. Baseline: Goal status: Ongoing  2.  Patient will participate in a 6 minute walk test to establish a baseline measurement. Baseline:  Goal status: Goal Met on 10/29/23   LONG TERM GOALS: Target date: 12/13/2023  Patient will be independent with advanced HEP to allow for self progression after discharge. Baseline:  Goal status: INITIAL  2.  Patient will improve modified Oswestry to no greater than 55% to demonstrate improvements in functional tasks. Baseline: 70% Goal status: INITIAL  3.  Patient will increase left hip strength to [facility] to allow patient to navigate a curb and stairs. Baseline:  Goal status: INITIAL  4.  Patient will report ability to work a shift of work pain no greater than 4/10. Baseline:  Goal status: INITIAL  5.  Patient will be able to demonstrate proper posture and body mechanics to be able to lift without increased pain. Baseline:  Goal status: INITIAL    PLAN:  PT FREQUENCY: 1-2x/week  PT DURATION: 8 weeks  PLANNED INTERVENTIONS: 97164- PT Re-evaluation, 97110-Therapeutic exercises, 97530- Therapeutic activity, [id number]- Neuromuscular re-education, 97535- Self Care, 02859- Manual therapy, [id number]- Gait training, [phone]- Canalith repositioning, [id number]- Aquatic Therapy, [phone]- Electrical stimulation (unattended), [phone]- Electrical stimulation (manual), [id number]- Vasopneumatic  device, [id number]- Ultrasound, [id number]- Traction (mechanical), [id number]- Ionotophoresis 4mg /ml Dexamethasone , 79439 (1-2 muscles), 20561 (3+ muscles)- Dry Needling, Patient/Family education, Balance training, Stair training, Taping, Joint mobilization, Joint manipulation, Spinal manipulation, Spinal mobilization, Vestibular training, Cryotherapy, and Moist heat.  PLAN FOR NEXT SESSION: Assess and progress HEP as indicated, strengthening, flexibility, manual/dry needling as indicated    Jarrell Laming, PT, DPT 10/29/23, 11:11 AM  [facility] [address], [address] [place], [place] [address] Phone # [phone] Fax [phone]

## 2023-10-29 NOTE — Telephone Encounter (Signed)
 Pt states her job doesn't allow restrictions so the previous restrictions she was placed on won't work.  Patient needs a note for work stating she needs to be on leave for however long Dr. Mercer decides for.  Pt needs this note by This coming Monday, 11/04/23.  If any questions, please call [phone]

## 2023-10-29 NOTE — Telephone Encounter (Signed)
 Copied from CRM #[id number]. 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

## 2023-10-31 NOTE — Telephone Encounter (Unsigned)
 Copied from CRM #[id number]. Topic: General - Call Back - No Documentation >> Oct 31, 2023 11:25 AM Franky GRADE wrote: Reason for CRM: Patient is returning a call she received from the office today; however, no documentation on a phone call from today.

## 2023-11-01 ENCOUNTER — Telehealth: Payer: Self-pay

## 2023-11-01 NOTE — Telephone Encounter (Signed)
 Called and left a detailed message per DPR, patient is aware,

## 2023-11-01 NOTE — Telephone Encounter (Signed)
 Work note has been sent in [employer] per Dr. Mercer

## 2023-11-01 NOTE — Telephone Encounter (Signed)
 No new forms received.  Okay for patient to remain out for the next 2 weeks.  It appears that neurosurgery tried to contact patient yesterday to schedule an appointment but she missed the call.  Advised patient to check voicemail and set up appointment if she has not already done so.  They can provide further recommendations regarding work/work restrictions if needed.

## 2023-11-01 NOTE — Telephone Encounter (Signed)
 Per Dr. Mercer okay to stay out till the 19th of AUG

## 2023-11-01 NOTE — Telephone Encounter (Signed)
 Copied from CRM #[id number]. 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 Her job will not let her work with any restrictions so that is why she needs the note. She needs to turn it in today by 3 PM or she will lose her insurance. Please call Curtistine at [phone] today.

## 2023-11-01 NOTE — Telephone Encounter (Signed)
 Copied from CRM [id number]. Topic: Clinical - Medical Advice >> Nov 01, 2023  1:54 PM Melissa C wrote: Reason for CRM: Patient's husband called, patient is in need of restriction paperwork filled out and Neurosurgeon cannot fill it out until the 18th because that is the next appointment with them. Patient has training for this next job on Monday and needs it filled out before then if possible. You may contact patient's husband regarding restriction paperwork release. [phone]

## 2023-11-01 NOTE — Telephone Encounter (Signed)
 Spoke with patient's husband per her earlier request Via mychart, 2 week leave has been sent to patient, patient is asking to be out till the 19th so she can have neuro fill out FMLA paperwork

## 2023-11-05 NOTE — Therapy (Signed)
 OUTPATIENT PHYSICAL THERAPY TREATMENT NOTE   Patient Name: Erika Larson MRN: [id number] DOB:1970-07-29, 53 y.o., female Today's Date: 11/06/2023  END OF SESSION:  PT End of Session - 11/06/23 1017     Visit Number 3    Date for PT Re-Evaluation 12/13/23    Authorization Type [employer]    PT Start Time 0935    PT Stop Time 1018    PT Time Calculation (min) 43 min    Activity Tolerance Patient tolerated treatment well    Behavior During Therapy WFL for tasks assessed/performed           Past Medical History:  Diagnosis Date   Allergy    SEASONAL   Anemia    Anxiety    Arthritis    Depression    Gallstones 1998   GERD (gastroesophageal reflux disease)    Hyperlipidemia    Hypertension    Seizures (HCC)    AS A CHILD,BUT NOT AS A ADULT   Past Surgical History:  Procedure Laterality Date   CHOLECYSTECTOMY     CYSTOSCOPY Bilateral 02/25/2019   Procedure: CYSTOSCOPY;  Surgeon: Taam-Akelman, Rosalea K, MD;  Location: [facility];  Service: Gynecology;  Laterality: Bilateral;   ROBOTIC ASSISTED LAPAROSCOPIC HYSTERECTOMY AND SALPINGECTOMY Bilateral 02/25/2019   Procedure: XI ROBOTIC ASSISTED LAPAROSCOPIC HYSTERECTOMY AND SALPINGECTOMY;  Surgeon: Bonnielee Rayleen POUR, MD;  Location: [facility];  Service: Gynecology;  Laterality: Bilateral;   TUBAL LIGATION     WISDOM TOOTH EXTRACTION     Patient Active Problem List   Diagnosis Date Noted   Status post hysterectomy 02/25/2019   Essential hypertension 04/27/2018   Arthritis 04/27/2018   Cigarette nicotine dependence without complication 04/27/2018   Obesity with body mass index 30 or greater 08/07/2016   Menorrhagia 08/03/2016   Smoker 08/03/2016    PCP: Mercer Clotilda SAUNDERS, MD  REFERRING PROVIDER: Mercer Clotilda SAUNDERS, MD  REFERRING DIAG: M54.16 (ICD-10-CM) - Lumbar radiculopathy, acute M48.061 (ICD-10-CM) - Lumbar foraminal stenosis  Rationale for Evaluation and Treatment:  Rehabilitation  THERAPY DIAG:  Other low back pain  Muscle weakness (generalized)  Cramp and spasm  Other abnormalities of gait and mobility  ONSET DATE: May 2025  SUBJECTIVE:                                                                                                                                                                                           SUBJECTIVE STATEMENT: After last visit, I haven't had as intense pain as this morning. Saturday had a little tingling into left knee. Patient reporting 5/10 pain today into R post thigh.  Pt present with spouse, Erika Larson  PERTINENT HISTORY:  OA, GERD  PAIN:  Are you having pain? Yes: NPRS scale: 5/10 Pain location: low back and down left leg Pain description: when it hits, it just hits.  Shocking radiating pain Aggravating factors: walking, lifting, standing Relieving factors: unknown  PRECAUTIONS: None  RED FLAGS: None   WEIGHT BEARING RESTRICTIONS: No  FALLS:  Has patient fallen in last 6 months? 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  NEXT MD VISIT: as needed pending progress with PT  OBJECTIVE:  Note: Objective measures were completed at Evaluation unless otherwise noted.  DIAGNOSTIC FINDINGS:  Lumbar MRI on 10/02/2023: IMPRESSION: 1. At L5-S1, moderate left and mild right foraminal stenosis. 2. At L4-L5, mild left foraminal stenosis with left foraminal and extraforaminal disc protrusion closely approximates the exiting/exited left L4 nerve. 3. At L3-L4, mild canal and bilateral subarticular recess stenosis.  PATIENT SURVEYS:  Modified Oswestry:  MODIFIED OSWESTRY DISABILITY SCALE  Score Date: 10/21/23  Pain intensity 5 =  Pain  medication has no effect on my pain.  2. Personal care (washing, dressing, etc.) 2 =  It is painful to take care of myself, and I am slow and careful.  3. Lifting 4 = I can lift only very light weights  4. Walking 3 =  Pain prevents me from walking more than  mile.  5. Sitting 2 =  Pain prevents me from sitting more than 1 hour.  6. Standing 4 =  Pain prevents me from standing more than 10 minutes.  7. Sleeping 5 =  Pain prevents me from sleeping at all.  8. Social Life 3 =  Pain prevents me from going out very often.  9. Traveling 3 = My pain restricts my travel over 1 hour  10. Employment/ Homemaking 4 = Pain prevents me from doing even light duties.  Total 35/50 = 70%   Interpretation of scores: Score Category Description  0-20% Minimal Disability The patient can cope with most living activities. Usually no treatment is indicated apart from advice on lifting, sitting and exercise  21-40% Moderate Disability The patient experiences more pain and difficulty with sitting, lifting and standing. Travel and social life are more difficult and they may be disabled from work. Personal care, sexual activity and sleeping are not grossly affected, and the patient can usually be managed by conservative means  41-60% Severe Disability Pain remains the main problem in this group, but activities of daily living are affected. These patients require a detailed investigation  61-80% Crippled Back pain impinges on all aspects of the patient's life. Positive intervention is required  81-100% Bed-bound  These patients are either bed-bound or exaggerating their symptoms  Bluford FORBES [name], et al. Surgery versus conservative management of stable thoracolumbar fracture: the PRESTO feasibility RCT. [place] ([place]): [employer]; 2021 Nov. [facility] Technology Assessment, No. 25.62.) Appendix 3, Oswestry Disability Index category descriptors. Available from:  FindJewelers.cz  Minimally Clinically Important Difference (MCID) = 12.8%  COGNITION: Overall cognitive status: Within functional limits for tasks assessed     SENSATION: Patient reports numbness and tingling down left leg  MUSCLE LENGTH: Hamstrings: Tightness bilateral (left more impaired than right)  POSTURE: rounded shoulders and forward head  PALPATION: Tightness and muscle spasms along cervical paraspinals, upper trap, and lumbar paraspinals  LUMBAR ROM:   Decreased at least 50% with increased pain  LOWER EXTREMITY ROM:     WFL  LOWER EXTREMITY MMT:    Eval: Left hip strength of 4/5 Right LE strength is WFL  LUMBAR SPECIAL TESTS:  Slump test: positive on the left side  FUNCTIONAL TESTS:  Eval: 5 times sit to stand: 13.37 sec Timed up and go (TUG): 9.34 sec  10/29/2023: 6 minute walk test:  1,137 ft with no increase of pain (age related norms 1,617 ft)  GAIT: Distance walked: >500 ft Assistive device utilized: None Level of assistance: Complete Independence Comments: Patient states that she starts having increased pain with ambulation  TREATMENT DATE:  11/06/23/2025: Nustep level 3 x6 min with PT present to discuss status Seated hamstring stretch 2x20 sec bilat Seated piriformis stretch 2x20 sec bilat Seated hip abduction clamshells with red band 2x10 Seated marching with red loop around thighs 2x10 Seated scapular retraction x10 with 5 sec hold - attempted with red band but too much UT engagement Standing lumbar extension x5 no change in sx Standing lumbar extension with forearms on wall 2x5 - decreases pain to 3/10 from 5/10 Seated lumbo-thoracic extension with ball behind back with OH reach 2x10 Supine transversus abdominus contraction by pressing peanut into thighs with hands 2x10 Standing calf stretch on slanted rocker board x20 sec    10/29/2023: Nustep level 3 x6 min with PT present to discuss status Seated  hamstring stretch 2x20 sec bilat Seated piriformis stretch 2x20 sec bilat Seated scapular retraction x10 with 5 sec hold Seated transversus abdominus contraction by pressing ball into thighs with hands 2x10 Seated lumbo-thoracic extension with ball behind back 2x10 Seated hip abduction clamshells with red loop 2x10 Seated marching with red loop around thighs 2x10 6 minute walk test:  1,137 ft with no increase of pain Standing calf stretch on slanted rocker board x20 sec Standing lumbar extension x5 with patient reporting less radicular leg pain following   10/21/2023: Reviewed goal and purposes of PT Issued and reviewed HEP                                                                                                                                PATIENT EDUCATION:  Education details: Issued HEP Person educated: Patient and Spouse Education method: Explanation, Demonstration, and Handouts Education comprehension: verbalized understanding and returned demonstration  HOME EXERCISE PROGRAM: Access Code: [email or web address] URL: [email or web address] Date: 10/29/2023 Prepared by: Jarrell Laming  Exercises - Gastroc Stretch with Foot at Wall  - 1 x daily - 7 x weekly - 2 reps - 20 sec hold - Seated Scapular Retraction  - 1 x daily - 7 x weekly - 2 sets - 10 reps - Seated Hamstring Stretch  - 1 x daily - 7 x weekly - 2 reps - Seated Piriformis Stretch with Trunk Bend  - 1  x daily - 7 x weekly - 2 reps - 20 sec hold - Supine Lower Trunk Rotation  - 1 x daily - 7 x weekly - 1-2 sets - 10 reps - Supine Posterior Pelvic Tilt  - 1 x daily - 7 x weekly - 2 sets - 10 reps - Seated Transversus Abdominis Bracing  - 1 x daily - 7 x weekly - 2 sets - 10 reps - Standing Lumbar Extension  - 1 x daily - 7 x weekly - 1-2 sets - 10 reps  ASSESSMENT:  CLINICAL IMPRESSION:  Patient reporting significant improvement in pain since last visit until this morning. Today she presents with 5/10 pain  into the R post thigh. She was able to decrease the intensity of her pain with lumbar ext at the wall. She reported some burning pain into left lower leg at end of session, but reported less tightness in post thigh. We discussed spending less time in her recliner and to getting into a position that abolishes or nearly abolishes her pain.   OBJECTIVE IMPAIRMENTS: decreased balance, decreased coordination, difficulty walking, decreased strength, impaired flexibility, postural dysfunction, and pain.   ACTIVITY LIMITATIONS: carrying, lifting, bending, sitting, standing, squatting, and sleeping  PARTICIPATION LIMITATIONS: meal prep, cleaning, laundry, community activity, and occupation  PERSONAL FACTORS: Time since onset of injury/illness/exacerbation and 1 comorbidity: OA are also affecting patient's functional outcome.   REHAB POTENTIAL: Good  CLINICAL DECISION MAKING: Stable/uncomplicated  EVALUATION COMPLEXITY: Low   GOALS: Goals reviewed with patient? Yes  SHORT TERM GOALS: Target date: 11/13/23  Patient will be independent with initial HEP. Baseline: Goal status: Ongoing  2.  Patient will participate in a 6 minute walk test to establish a baseline measurement. Baseline:  Goal status: Goal Met on 10/29/23   LONG TERM GOALS: Target date: 12/13/2023  Patient will be independent with advanced HEP to allow for self progression after discharge. Baseline:  Goal status: INITIAL  2.  Patient will improve modified Oswestry to no greater than 55% to demonstrate improvements in functional tasks. Baseline: 70% Goal status: INITIAL  3.  Patient will increase left hip strength to [facility] to allow patient to navigate a curb and stairs. Baseline:  Goal status: INITIAL  4.  Patient will report ability to work a shift of work pain no greater than 4/10. Baseline:  Goal status: INITIAL  5.  Patient will be able to demonstrate proper posture and body mechanics to be able to lift without  increased pain. Baseline:  Goal status: INITIAL    PLAN:  PT FREQUENCY: 1-2x/week  PT DURATION: 8 weeks  PLANNED INTERVENTIONS: 97164- PT Re-evaluation, 97110-Therapeutic exercises, 97530- Therapeutic activity, [id number]- Neuromuscular re-education, 97535- Self Care, [id number]- Manual therapy, [id number]- Gait training, [phone]- Canalith repositioning, [id number]- Aquatic Therapy, [phone]- Electrical stimulation (unattended), [phone]- Electrical stimulation (manual), [id number]- Vasopneumatic device, [id number]- Ultrasound, [id number]- Traction (mechanical), [id number]- Ionotophoresis 4mg /ml Dexamethasone , 79439 (1-2 muscles), [id number] (3+ muscles)- Dry Needling, Patient/Family education, Balance training, Stair training, Taping, Joint mobilization, Joint manipulation, Spinal manipulation, Spinal mobilization, Vestibular training, Cryotherapy, and Moist heat.  PLAN FOR NEXT SESSION: Assess and progress HEP as indicated, strengthening, flexibility, manual/dry needling as indicated    Mliss Cummins, PT  11/06/23, 10:37 AM  [facility] [address], [address] [place], [place] [address] Phone # [phone] Fax [phone]

## 2023-11-06 ENCOUNTER — Ambulatory Visit: Attending: Family Medicine | Admitting: Physical Therapy

## 2023-11-06 ENCOUNTER — Encounter: Payer: Self-pay | Admitting: Physical Therapy

## 2023-11-06 DIAGNOSIS — M6281 Muscle weakness (generalized): Secondary | ICD-10-CM | POA: Insufficient documentation

## 2023-11-06 DIAGNOSIS — R252 Cramp and spasm: Secondary | ICD-10-CM | POA: Insufficient documentation

## 2023-11-06 DIAGNOSIS — R262 Difficulty in walking, not elsewhere classified: Secondary | ICD-10-CM | POA: Diagnosis present

## 2023-11-06 DIAGNOSIS — M5459 Other low back pain: Secondary | ICD-10-CM | POA: Diagnosis present

## 2023-11-06 DIAGNOSIS — R2689 Other abnormalities of gait and mobility: Secondary | ICD-10-CM | POA: Insufficient documentation

## 2023-11-06 DIAGNOSIS — R293 Abnormal posture: Secondary | ICD-10-CM | POA: Insufficient documentation

## 2023-11-06 DIAGNOSIS — M546 Pain in thoracic spine: Secondary | ICD-10-CM | POA: Diagnosis present

## 2023-11-13 NOTE — Progress Notes (Unsigned)
 Referring Physician:  Mercer Clotilda SAUNDERS, MD [address] [place],  [place] [address]  Primary Physician:  Mercer Clotilda SAUNDERS, MD  History of Present Illness: 11/18/2023 Erika Larson is here today with a chief complaint of low back pain radiating to bilateral legs that has been ongoing for 4 months.  She states it originally started in her left leg, but she now feels as though it might be worse than her right.  She believes that she initially hurt her back when she was lifting heavy boxes.  When it extends down her legs is both the front and back of her thigh.  On the right leg it stops mid calf and on the left leg it goes the top of her foot.  This is associated with numbness and tingling.  She does find herself trying to lean forward quite a bit in order to help with her pain.  She also describes intermittent cramping in the backs of her legs.  She denies any weakness or incontinence to bowel or bladder.   Duration: April 2025 Severity: 7/10  Precipitating: aggravated by prolonged standing Modifying factors: made better by nothing Weakness: none Timing: constant Bowel/Bladder Dysfunction: none  Conservative measures:  Physical therapy: currently participating in through [facility], helps for that day only    Multimodal medical therapy including regular antiinflammatories: prednisone , meloxicam , gabapentin , ibuprofen   Injections: no epidural steroid injections  Past Surgery: no spinal surgeries   Erika Larson has no symptoms of cervical myelopathy.  The symptoms are causing a significant impact on the patient's life.   Review of Systems:  A 10 point review of systems is negative, except for the pertinent positives and negatives detailed in the HPI.  Past Medical History: Past Medical History:  Diagnosis Date   Allergy    SEASONAL   Anemia    Anxiety    Arthritis    Depression    Gallstones 1998   GERD (gastroesophageal reflux disease)     Hyperlipidemia    Hypertension    Seizures (HCC)    AS A CHILD,BUT NOT AS A ADULT    Past Surgical History: Past Surgical History:  Procedure Laterality Date   CHOLECYSTECTOMY     CYSTOSCOPY Bilateral 02/25/2019   Procedure: CYSTOSCOPY;  Surgeon: Taam-Akelman, Rosalea K, MD;  Location: [facility];  Service: Gynecology;  Laterality: Bilateral;   ROBOTIC ASSISTED LAPAROSCOPIC HYSTERECTOMY AND SALPINGECTOMY Bilateral 02/25/2019   Procedure: XI ROBOTIC ASSISTED LAPAROSCOPIC HYSTERECTOMY AND SALPINGECTOMY;  Surgeon: Bonnielee Rayleen POUR, MD;  Location: [facility];  Service: Gynecology;  Laterality: Bilateral;   TUBAL LIGATION     WISDOM TOOTH EXTRACTION      Allergies: Allergies as of 11/18/2023 - Review Complete 11/18/2023  Allergen Reaction Noted   Shellfish allergy Anaphylaxis and Swelling 08/06/2011   Lisinopril   12/16/2019    Medications: Outpatient Encounter Medications as of 11/18/2023  Medication Sig   gabapentin  (NEURONTIN ) 300 MG capsule Take 1 capsule (300 mg total) by mouth 3 (three) times daily.   tiZANidine  (ZANAFLEX ) 4 MG tablet Take 1 tablet (4 mg total) by mouth 3 (three) times daily.   aspirin EC 81 MG tablet Take 81 mg by mouth daily. Swallow whole.   hydrochlorothiazide  (HYDRODIURIL ) 12.5 MG tablet TAKE 1 TABLET BY MOUTH EVERY DAY   hydrocortisone  (ANUSOL -HC) 25 MG suppository Place 1 suppository (25 mg total) rectally 2 (two) times daily.   ibuprofen  (ADVIL ) 600 MG tablet Take 600 mg by mouth every 6 (  six) hours as needed.   Iron, Ferrous Sulfate , 325 (65 Fe) MG TABS Take 325 mg by mouth daily.    meloxicam  (MOBIC ) 7.5 MG tablet TAKE 1 TABLET(7.5 MG) BY MOUTH DAILY   omeprazole  (PRILOSEC) 20 MG capsule TAKE 1 CAPSULE(20 MG) BY MOUTH DAILY (Patient not taking: Reported on 10/21/2023)   spironolactone  (ALDACTONE ) 25 MG tablet TAKE 1/2 TABLET BY MOUTH DAILY   VITAMIN D  PO Take by mouth. TAKE GUMMIES DAILY   [DISCONTINUED] gabapentin   (NEURONTIN ) 100 MG capsule TAKE 1 CAPSULE(100 MG) BY MOUTH TWICE DAILY   No facility-administered encounter medications on file as of 11/18/2023.    Social History: Social History   Tobacco Use   Smoking status: Every Day    Current packs/day: 0.25    Types: Cigarettes   Smokeless tobacco: Never  Vaping Use   Vaping status: Never Used  Substance Use Topics   Alcohol use: Yes    Comment: occ   Drug use: No    Family Medical History: Family History  Problem Relation Age of Onset   Stomach cancer Mother    Arthritis Mother    Hearing loss Mother    Hypertension Mother    Stomach cancer Sister    Hypertension Sister    Hypertension Sister    Hypertension Sister    Hypertension Sister    Hypertension Sister    Hypertension Brother    Hypertension Brother    Hypertension Brother    Hypertension Brother    Other Daughter        died in MVA   Colon cancer Neg Hx    Colon polyps Neg Hx    Esophageal cancer Neg Hx    Crohn's disease Neg Hx    Rectal cancer Neg Hx    Ulcerative colitis Neg Hx     Physical Examination: @VITALWITHPAIN @  General: Patient is well developed, well nourished, calm, collected, and in no apparent distress. Attention to examination is appropriate.  Psychiatric: Patient is non-anxious.  Head:  Pupils equal, round, and reactive to light.  ENT:  Oral mucosa appears well hydrated.  Neck:   Supple.  Full range of motion.  Respiratory: Patient is breathing without any difficulty.  Extremities: No edema.  Vascular: Palpable dorsal pedal pulses.  Skin:   On exposed skin, there are no abnormal skin lesions.  NEUROLOGICAL:     Awake, alert, oriented to person, place, and time.  Speech is clear and fluent. Fund of knowledge is appropriate.   Cranial Nerves: Pupils equal round and reactive to light.  Facial tone is symmetric.   ROM of spine: Mild tenderness to palpation of lumbar paraspinals.  +SLR on the left   Strength:  Side Iliopsoas  Quads Hamstring PF DF EHL  R 5 5 5 5 5 5   L 5 5 5 5 5 5    Reflexes are 2+ at the right patella, absent on the left patella.  1+ bilateral Achilles. Clonus is not present.  Toes are down-going.  Bilateral upper and lower extremity sensation is intact to light touch.    Gait is normal.   No difficulty with tandem gait.   No evidence of dysmetria noted.  Medical Decision Making  Imaging: EXAM: MRI LUMBAR SPINE WITHOUT CONTRAST   TECHNIQUE: Multiplanar, multisequence MR imaging of the lumbar spine was performed. No intravenous contrast was administered.   COMPARISON:  Lumbar radiographs February 07, 2016.   FINDINGS: Segmentation:  Standard.   Alignment:  Physiologic.   Vertebrae:  No  fracture, evidence of discitis, or bone lesion.   Conus medullaris and cauda equina: Conus extends to the L1-L2 level. Conus and cauda equina appear normal.   Paraspinal and other soft tissues: Unremarkable.   Disc levels:   T12-L1: No significant disc protrusion, foraminal stenosis, or canal stenosis.   L1-L2: No significant disc protrusion, foraminal stenosis, or canal stenosis.   L2-L3: No significant disc protrusion, foraminal stenosis, or canal stenosis.   L3-L4: Broad disc bulge with bilateral facet arthropathy and inferiorly directed right subarticular disc protrusion. Resulting mild canal and mild bilateral subarticular recess stenosis. Patent foramina.   L4-L5: Left foraminal and extraforaminal disc protrusion closely approximates the exiting/exited left L4 nerve. Mild left foraminal stenosis. Patent canal and right foramen.   L5-S1: Disc bulging endplate spurring with bilateral facet arthropathy peers Ult Ng moderate left and mild right foraminal stenosis.   IMPRESSION: 1. At L5-S1, moderate left and mild right foraminal stenosis. 2. At L4-L5, mild left foraminal stenosis with left foraminal and extraforaminal disc protrusion closely approximates the exiting/exited  left L4 nerve. 3. At L3-L4, mild canal and bilateral subarticular recess stenosis.    I have personally reviewed the images and agree with the above interpretation.  Assessment and Plan: Erika Larson is a pleasant 53 y.o. female is here today with a chief complaint of low back pain radiating to bilateral legs that has been ongoing for 4 months.  She states it originally started in her left leg, but she now feels as though it might be worse than her right.  She believes that she initially hurt her back when she was lifting heavy boxes.  When it extends down her legs is both the front and back of her thigh.  On the right leg it stops mid calf and on the left leg it goes the top of her foot.  This is associated with numbness and tingling.  She does find herself trying to lean forward quite a bit in order to help with her pain.  She also describes intermittent cramping in the backs of her legs.  On examination, some tenderness palpation of lumbar paraspinals.  Positive straight leg raise on the left side.  She is full strength.  MRI reviewed at length which does show a disc protrusion at L4-5 with some degenerative changes throughout her lumbar spine.  Plan moving forward includes the following:  -Continue with physical therapy.  Plan to see back in clinic in approximately 1 month when she has undergone physical therapy for at least 6 weeks. - Increase gabapentin  to 300 mg 3 times a day.  The side effects of this medication were reviewed at length. - Tizanidine  for muscle spasms.  The side effects of this were also reviewed at length, including drowsiness. - Dynamic x-rays to be completed today to evaluate for listhesis. - Patient is interested in injections.  Pain clinic referral has been sent for L4 ESI.   Thank you for involving me in the care of this patient.   I spent a total of 45 minutes in both face-to-face and non-face-to-face activities for this visit on the date of this encounter including  preparing to see the patient, obtaining and reviewing separately obtained history, performing medically appropriate examination, counseling the patient and their family, ordering additional medications and tests, documenting clinical information, independently interpreting results.   Lyle Decamp, PA-C Dept. of Neurosurgery

## 2023-11-14 ENCOUNTER — Encounter: Payer: Self-pay | Admitting: Rehabilitative and Restorative Service Providers"

## 2023-11-14 ENCOUNTER — Ambulatory Visit: Admitting: Rehabilitative and Restorative Service Providers"

## 2023-11-14 DIAGNOSIS — M5459 Other low back pain: Secondary | ICD-10-CM | POA: Diagnosis not present

## 2023-11-14 DIAGNOSIS — M6281 Muscle weakness (generalized): Secondary | ICD-10-CM

## 2023-11-14 DIAGNOSIS — R2689 Other abnormalities of gait and mobility: Secondary | ICD-10-CM

## 2023-11-14 DIAGNOSIS — R252 Cramp and spasm: Secondary | ICD-10-CM

## 2023-11-14 NOTE — Therapy (Signed)
 OUTPATIENT PHYSICAL THERAPY TREATMENT NOTE   Patient Name: Erika Larson MRN: [id number] DOB:05-Jun-1970, 53 y.o., female Today's Date: 11/14/2023  END OF SESSION:  PT End of Session - 11/14/23 1054     Visit Number 4    Date for PT Re-Evaluation 12/13/23    Authorization Type [employer]    PT Start Time 1100    PT Stop Time 1140    PT Time Calculation (min) 40 min    Activity Tolerance Patient tolerated treatment well    Behavior During Therapy WFL for tasks assessed/performed           Past Medical History:  Diagnosis Date   Allergy    SEASONAL   Anemia    Anxiety    Arthritis    Depression    Gallstones 1998   GERD (gastroesophageal reflux disease)    Hyperlipidemia    Hypertension    Seizures (HCC)    AS A CHILD,BUT NOT AS A ADULT   Past Surgical History:  Procedure Laterality Date   CHOLECYSTECTOMY     CYSTOSCOPY Bilateral 02/25/2019   Procedure: CYSTOSCOPY;  Surgeon: Taam-Akelman, Rosalea K, MD;  Location: [facility];  Service: Gynecology;  Laterality: Bilateral;   ROBOTIC ASSISTED LAPAROSCOPIC HYSTERECTOMY AND SALPINGECTOMY Bilateral 02/25/2019   Procedure: XI ROBOTIC ASSISTED LAPAROSCOPIC HYSTERECTOMY AND SALPINGECTOMY;  Surgeon: Bonnielee Rayleen POUR, MD;  Location: [facility];  Service: Gynecology;  Laterality: Bilateral;   TUBAL LIGATION     WISDOM TOOTH EXTRACTION     Patient Active Problem List   Diagnosis Date Noted   Status post hysterectomy 02/25/2019   Essential hypertension 04/27/2018   Arthritis 04/27/2018   Cigarette nicotine dependence without complication 04/27/2018   Obesity with body mass index 30 or greater 08/07/2016   Menorrhagia 08/03/2016   Smoker 08/03/2016    PCP: Mercer Clotilda SAUNDERS, MD  REFERRING PROVIDER: Mercer Clotilda SAUNDERS, MD  REFERRING DIAG: M54.16 (ICD-10-CM) - Lumbar radiculopathy, acute M48.061 (ICD-10-CM) - Lumbar foraminal stenosis  Rationale for Evaluation and Treatment:  Rehabilitation  THERAPY DIAG:  Other low back pain  Muscle weakness (generalized)  Cramp and spasm  Other abnormalities of gait and mobility  ONSET DATE: May 2025  SUBJECTIVE:                                                                                                                                                                                           SUBJECTIVE STATEMENT: I'm still feeling better   PERTINENT HISTORY:  OA, GERD  PAIN:  Are you having pain? Yes: NPRS scale: 2/10 Pain location: low back and down left leg Pain  description: when it hits, it just hits.  Shocking radiating pain Aggravating factors: walking, lifting, standing Relieving factors: unknown  PRECAUTIONS: None  RED FLAGS: None   WEIGHT BEARING RESTRICTIONS: No  FALLS:  Has patient fallen in last 6 months? 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  NEXT MD VISIT: as needed pending progress with PT  OBJECTIVE:  Note: Objective measures were completed at Evaluation unless otherwise noted.  DIAGNOSTIC FINDINGS:  Lumbar MRI on 10/02/2023: IMPRESSION: 1. At L5-S1, moderate left and mild right foraminal stenosis. 2. At L4-L5, mild left foraminal stenosis with left foraminal and extraforaminal disc protrusion closely approximates the exiting/exited left L4 nerve. 3. At L3-L4, mild canal and bilateral subarticular recess stenosis.  PATIENT SURVEYS:  Modified Oswestry:  MODIFIED OSWESTRY DISABILITY SCALE  Score Date: 10/21/23  Pain intensity 5 =  Pain medication has no effect on my pain.  2. Personal care (washing, dressing, etc.) 2 =  It is painful to take care of myself, and I am slow and careful.  3. Lifting 4 = I  can lift only very light weights  4. Walking 3 =  Pain prevents me from walking more than  mile.  5. Sitting 2 =  Pain prevents me from sitting more than 1 hour.  6. Standing 4 =  Pain prevents me from standing more than 10 minutes.  7. Sleeping 5 =  Pain prevents me from sleeping at all.  8. Social Life 3 =  Pain prevents me from going out very often.  9. Traveling 3 = My pain restricts my travel over 1 hour  10. Employment/ Homemaking 4 = Pain prevents me from doing even light duties.  Total 35/50 = 70%   Interpretation of scores: Score Category Description  0-20% Minimal Disability The patient can cope with most living activities. Usually no treatment is indicated apart from advice on lifting, sitting and exercise  21-40% Moderate Disability The patient experiences more pain and difficulty with sitting, lifting and standing. Travel and social life are more difficult and they may be disabled from work. Personal care, sexual activity and sleeping are not grossly affected, and the patient can usually be managed by conservative means  41-60% Severe Disability Pain remains the main problem in this group, but activities of daily living are affected. These patients require a detailed investigation  61-80% Crippled Back pain impinges on all aspects of the patient's life. Positive intervention is required  81-100% Bed-bound  These patients are either bed-bound or exaggerating their symptoms  Bluford FORBES [name], et al. Surgery versus conservative management of stable thoracolumbar fracture: the PRESTO feasibility RCT. [place] ([place]): [employer]; 2021 Nov. [facility] Technology Assessment, No. 25.62.) Appendix 3, Oswestry Disability Index category descriptors. Available from: FindJewel[employer].cz  Minimally Clinically Important Difference (MCID) = 12.8%  COGNITION: Overall cognitive status: Within functional limits for tasks  assessed     SENSATION: Patient reports numbness and tingling down left leg  MUSCLE LENGTH: Hamstrings: Tightness bilateral (left more impaired than right)  POSTURE: rounded shoulders and forward head  PALPATION: Tightness and muscle spasms along cervical paraspinals, upper trap, and lumbar paraspinals  LUMBAR ROM:   Decreased at least 50% with increased pain  LOWER EXTREMITY ROM:  WFL  LOWER EXTREMITY MMT:    Eval: Left hip strength of 4/5 Right LE strength is WFL  LUMBAR SPECIAL TESTS:  Slump test: positive on the left side  FUNCTIONAL TESTS:  Eval: 5 times sit to stand: 13.37 sec Timed up and go (TUG): 9.34 sec  10/29/2023: 6 minute walk test:  1,137 ft with no increase of pain (age related norms 1,617 ft)  GAIT: Distance walked: >500 ft Assistive device utilized: None Level of assistance: Complete Independence Comments: Patient states that she starts having increased pain with ambulation  TREATMENT DATE:  11/14/2023: Nustep level 5 x7 min with PT present to discuss status Seated hamstring stretch 2x20 sec bilat Seated piriformis stretch 2x20 sec bilat Sit to/from stand with chest press with 5# kettlebell x10 Seated hip abduction clamshells with red loop 2x10 Seated marching with red loop around thighs 2x10 Seated scapular retraction x10 with 5 sec hold Prone on elbows x2 min (reports some centralization of pain) Prone hip extension x10 bilat Seated transversus abdominus contraction by pressing peanut into thighs with hands 2x10 Seated lateral overhead stretch over peanut ball x10 bilat Seated shoulder ER with yellow tband 2x10 Seated shoulder horizontal abduction with yellow tband x10 (with cuing to decrease upper trap activation)   11/06/23/2025: Nustep level 3 x6 min with PT present to discuss status Seated hamstring stretch 2x20 sec bilat Seated piriformis stretch 2x20 sec bilat Seated hip abduction clamshells with red band 2x10 Seated marching  with red loop around thighs 2x10 Seated scapular retraction x10 with 5 sec hold - attempted with red band but too much UT engagement Standing lumbar extension x5 no change in sx Standing lumbar extension with forearms on wall 2x5 - decreases pain to 3/10 from 5/10 Seated lumbo-thoracic extension with ball behind back with OH reach 2x10 Supine transversus abdominus contraction by pressing peanut into thighs with hands 2x10 Standing calf stretch on slanted rocker board x20 sec    10/29/2023: Nustep level 3 x6 min with PT present to discuss status Seated hamstring stretch 2x20 sec bilat Seated piriformis stretch 2x20 sec bilat Seated scapular retraction x10 with 5 sec hold Seated transversus abdominus contraction by pressing ball into thighs with hands 2x10 Seated lumbo-thoracic extension with ball behind back 2x10 Seated hip abduction clamshells with red loop 2x10 Seated marching with red loop around thighs 2x10 6 minute walk test:  1,137 ft with no increase of pain Standing calf stretch on slanted rocker board x20 sec Standing lumbar extension x5 with patient reporting less radicular leg pain following    PATIENT EDUCATION:  Education details: Issued HEP Person educated: Patient and Spouse Education method: Explanation, Facilities manager, and Handouts Education comprehension: verbalized understanding and returned demonstration  HOME EXERCISE PROGRAM: Access Code: [email or web address] URL: [email or web address] Date: 10/29/2023 Prepared by: Jarrell Laming  Exercises - Gastroc Stretch with Foot at Wall  - 1 x daily - 7 x weekly - 2 reps - 20 sec hold - Seated Scapular Retraction  - 1 x daily - 7 x weekly - 2 sets - 10 reps - Seated Hamstring Stretch  - 1 x daily - 7 x weekly - 2 reps - Seated Piriformis Stretch with Trunk Bend  - 1 x daily - 7 x weekly - 2 reps - 20 sec hold - Supine Lower Trunk Rotation  - 1 x daily - 7 x weekly - 1-2 sets - 10 reps - Supine Posterior Pelvic Tilt   - 1 x daily - 7 x weekly - 2 sets -  10 reps - Seated Transversus Abdominis Bracing  - 1 x daily - 7 x weekly - 2 sets - 10 reps - Standing Lumbar Extension  - 1 x daily - 7 x weekly - 1-2 sets - 10 reps  ASSESSMENT:  CLINICAL IMPRESSION: Ms Fuchs presents to skilled PT reporting that she is feeling better, states that she still has not returned to work yet, but she is having less pain.  Patient does continue to have some centralization of symptoms with prone exercises.  Added in prone hip extension with cuing for a smaller range where she can maintain proper positioning.  Patient states that she has been compliant with HEP and does feel that she is making progress.  Patient continues to progress with core and postural strengthening throughout session.  OBJECTIVE IMPAIRMENTS: decreased balance, decreased coordination, difficulty walking, decreased strength, impaired flexibility, postural dysfunction, and pain.   ACTIVITY LIMITATIONS: carrying, lifting, bending, sitting, standing, squatting, and sleeping  PARTICIPATION LIMITATIONS: meal prep, cleaning, laundry, community activity, and occupation  PERSONAL FACTORS: Time since onset of injury/illness/exacerbation and 1 comorbidity: OA are also affecting patient's functional outcome.   REHAB POTENTIAL: Good  CLINICAL DECISION MAKING: Stable/uncomplicated  EVALUATION COMPLEXITY: Low   GOALS: Goals reviewed with patient? Yes  SHORT TERM GOALS: Target date: 11/13/23  Patient will be independent with initial HEP. Baseline: Goal status: Goal met on 11/14/23  2.  Patient will participate in a 6 minute walk test to establish a baseline measurement. Baseline:  Goal status: Goal Met on 10/29/23   LONG TERM GOALS: Target date: 12/13/2023  Patient will be independent with advanced HEP to allow for self progression after discharge. Baseline:  Goal status: INITIAL  2.  Patient will improve modified Oswestry to no greater than 55% to  demonstrate improvements in functional tasks. Baseline: 70% Goal status: INITIAL  3.  Patient will increase left hip strength to [facility] to allow patient to navigate a curb and stairs. Baseline:  Goal status: Ongoing  4.  Patient will report ability to work a shift of work pain no greater than 4/10. Baseline:  Goal status: INITIAL  5.  Patient will be able to demonstrate proper posture and body mechanics to be able to lift without increased pain. Baseline:  Goal status: Ongoing    PLAN:  PT FREQUENCY: 1-2x/week  PT DURATION: 8 weeks  PLANNED INTERVENTIONS: [id number]- PT Re-evaluation, 97110-Therapeutic exercises, 97530- Therapeutic activity, [id number]- Neuromuscular re-education, 97535- Self Care, [id number]- Manual therapy, [id number]- Gait training, [phone]- Canalith repositioning, [id number]- Aquatic Therapy, [id number]- Electrical stimulation (unattended), [phone]- Electrical stimulation (manual), [id number]- Vasopneumatic device, [id number]- Ultrasound, [id number]- Traction (mechanical), [id number]- Ionotophoresis 4mg /ml Dexamethasone , 79439 (1-2 muscles), [id number] (3+ muscles)- Dry Needling, Patient/Family education, Balance training, Stair training, Taping, Joint mobilization, Joint manipulation, Spinal manipulation, Spinal mobilization, Vestibular training, Cryotherapy, and Moist heat.  PLAN FOR NEXT SESSION: Assess and progress HEP as indicated, strengthening, flexibility, manual/dry needling as indicated    Jarrell Laming, PT, DPT 11/14/23, 11:48 AM  [facility] [address], [address] [place], [place] [address] Phone # [phone] Fax [phone]

## 2023-11-18 ENCOUNTER — Ambulatory Visit
Admission: RE | Admit: 2023-11-18 | Discharge: 2023-11-18 | Disposition: A | Discharge status: Home / Self Care | Attending: Physician Assistant | Admitting: Physician Assistant

## 2023-11-18 ENCOUNTER — Encounter: Payer: Self-pay | Admitting: Physician Assistant

## 2023-11-18 ENCOUNTER — Ambulatory Visit
Admission: RE | Admit: 2023-11-18 | Discharge: 2023-11-18 | Disposition: A | Discharge status: Home / Self Care | Source: Ambulatory Visit | Attending: Physician Assistant | Admitting: Physician Assistant

## 2023-11-18 ENCOUNTER — Ambulatory Visit: Admitting: Physician Assistant

## 2023-11-18 VITALS — BP 130/82 | Ht 62.5 in | Wt 227.0 lb

## 2023-11-18 DIAGNOSIS — M51362 Other intervertebral disc degeneration, lumbar region with discogenic back pain and lower extremity pain: Secondary | ICD-10-CM | POA: Diagnosis not present

## 2023-11-18 DIAGNOSIS — M1990 Unspecified osteoarthritis, unspecified site: Secondary | ICD-10-CM

## 2023-11-18 DIAGNOSIS — M5416 Radiculopathy, lumbar region: Secondary | ICD-10-CM

## 2023-11-18 DIAGNOSIS — M5126 Other intervertebral disc displacement, lumbar region: Secondary | ICD-10-CM | POA: Diagnosis not present

## 2023-11-18 DIAGNOSIS — X500XXA Overexertion from strenuous movement or load, initial encounter: Secondary | ICD-10-CM

## 2023-11-18 MED ORDER — TIZANIDINE HCL 4 MG PO TABS: 4.0000 mg | ORAL_TABLET | Freq: Three times a day (TID) | ORAL | 1 refills | Status: DC

## 2023-11-18 MED ORDER — GABAPENTIN 300 MG PO CAPS: 300.0000 mg | ORAL_CAPSULE | Freq: Three times a day (TID) | ORAL | 2 refills | Status: DC

## 2023-11-18 NOTE — Patient Instructions (Addendum)
 Tylenol  1000mg , three times a day Ibuprofen  as needed throughout the day.  Heat and ice, 20  minutes on and off  Gabapentin  Tizanidine 

## 2023-11-18 NOTE — Therapy (Incomplete)
 OUTPATIENT PHYSICAL THERAPY TREATMENT NOTE   Patient Name: Erika Larson MRN: [id number] DOB:1970/12/20, 53 y.o., female Today's Date: 11/18/2023  END OF SESSION:     Past Medical History:  Diagnosis Date   Allergy    SEASONAL   Anemia    Anxiety    Arthritis    Depression    Gallstones 1998   GERD (gastroesophageal reflux disease)    Hyperlipidemia    Hypertension    Seizures (HCC)    AS A CHILD,BUT NOT AS A ADULT   Past Surgical History:  Procedure Laterality Date   CHOLECYSTECTOMY     CYSTOSCOPY Bilateral 02/25/2019   Procedure: CYSTOSCOPY;  Surgeon: Taam-Akelman, Rosalea K, MD;  Location: [facility];  Service: Gynecology;  Laterality: Bilateral;   ROBOTIC ASSISTED LAPAROSCOPIC HYSTERECTOMY AND SALPINGECTOMY Bilateral 02/25/2019   Procedure: XI ROBOTIC ASSISTED LAPAROSCOPIC HYSTERECTOMY AND SALPINGECTOMY;  Surgeon: Bonnielee Rayleen POUR, MD;  Location: [facility];  Service: Gynecology;  Laterality: Bilateral;   TUBAL LIGATION     WISDOM TOOTH EXTRACTION     Patient Active Problem List   Diagnosis Date Noted   Status post hysterectomy 02/25/2019   Essential hypertension 04/27/2018   Arthritis 04/27/2018   Cigarette nicotine dependence without complication 04/27/2018   Obesity with body mass index 30 or greater 08/07/2016   Menorrhagia 08/03/2016   Smoker 08/03/2016    PCP: Mercer Clotilda SAUNDERS, MD  REFERRING PROVIDER: Mercer Clotilda SAUNDERS, MD  REFERRING DIAG: M54.16 (ICD-10-CM) - Lumbar radiculopathy, acute M48.061 (ICD-10-CM) - Lumbar foraminal stenosis  Rationale for Evaluation and Treatment: Rehabilitation  THERAPY DIAG:  No diagnosis found.  ONSET DATE: May 2025  SUBJECTIVE:                                                                                                                                                                                           SUBJECTIVE STATEMENT: ***   PERTINENT HISTORY:   OA, GERD  PAIN:  Are you having pain? Yes: NPRS scale: 2/10 Pain location: low back and down left leg Pain description: when it hits, it just hits.  Shocking radiating pain Aggravating factors: walking, lifting, standing Relieving factors: unknown  PRECAUTIONS: None  RED FLAGS: None   WEIGHT BEARING RESTRICTIONS: No  FALLS:  Has patient fallen in last 6 months? 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  NEXT MD VISIT:  as needed pending progress with PT  OBJECTIVE:  Note: Objective measures were completed at Evaluation unless otherwise noted.  DIAGNOSTIC FINDINGS:  Lumbar MRI on 10/02/2023: IMPRESSION: 1. At L5-S1, moderate left and mild right foraminal stenosis. 2. At L4-L5, mild left foraminal stenosis with left foraminal and extraforaminal disc protrusion closely approximates the exiting/exited left L4 nerve. 3. At L3-L4, mild canal and bilateral subarticular recess stenosis.  PATIENT SURVEYS:  Modified Oswestry:  MODIFIED OSWESTRY DISABILITY SCALE  Score Date: 10/21/23  Pain intensity 5 =  Pain medication has no effect on my pain.  2. Personal care (washing, dressing, etc.) 2 =  It is painful to take care of myself, and I am slow and careful.  3. Lifting 4 = I can lift only very light weights  4. Walking 3 =  Pain prevents me from walking more than  mile.  5. Sitting 2 =  Pain prevents me from sitting more than 1 hour.  6. Standing 4 =  Pain prevents me from standing more than 10 minutes.  7. Sleeping 5 =  Pain prevents me from sleeping at all.  8. Social Life 3 =  Pain prevents me from going out very often.  9. Traveling 3 = My pain restricts my travel over 1 hour  10. Employment/ Homemaking 4  = Pain prevents me from doing even light duties.  Total 35/50 = 70%   Interpretation of scores: Score Category Description  0-20% Minimal Disability The patient can cope with most living activities. Usually no treatment is indicated apart from advice on lifting, sitting and exercise  21-40% Moderate Disability The patient experiences more pain and difficulty with sitting, lifting and standing. Travel and social life are more difficult and they may be disabled from work. Personal care, sexual activity and sleeping are not grossly affected, and the patient can usually be managed by conservative means  41-60% Severe Disability Pain remains the main problem in this group, but activities of daily living are affected. These patients require a detailed investigation  61-80% Crippled Back pain impinges on all aspects of the patient's life. Positive intervention is required  81-100% Bed-bound  These patients are either bed-bound or exaggerating their symptoms  Bluford FORBES [name], et al. Surgery versus conservative management of stable thoracolumbar fracture: the PRESTO feasibility RCT. [place] ([place]): [employer]; 2021 Nov. [facility] Technology Assessment, No. 25.62.) Appendix 3, Oswestry Disability Index category descriptors. Available from: [employer]el[employer]cz  Minimally Clinically Important Difference (MCID) = 12.8%  COGNITION: Overall cognitive status: Within functional limits for tasks assessed     SENSATION: Patient reports numbness and tingling down left leg  MUSCLE LENGTH: Hamstrings: Tightness bilateral (left more impaired than right)  POSTURE: rounded shoulders and forward head  PALPATION: Tightness and muscle spasms along cervical paraspinals, upper trap, and lumbar paraspinals  LUMBAR ROM:   Decreased at least 50% with increased pain  LOWER EXTREMITY ROM:     WFL  LOWER EXTREMITY MMT:    Eval: Left hip strength of 4/5 Right LE  strength is WFL  LUMBAR SPECIAL TESTS:  Slump test: positive on the left side  FUNCTIONAL TESTS:  Eval: 5 times sit to stand: 13.37 sec Timed up and go (TUG): 9.34 sec  10/29/2023: 6 minute walk test:  1,137 ft with no increase of pain (age related norms 1,617 ft)  GAIT: Distance walked: >500 ft Assistive device utilized: None Level of assistance: Complete Independence Comments: Patient states that she starts having increased pain with  ambulation  TREATMENT DATE:  11/19/2023: Nustep level 5 x7 min with PT present to discuss status Seated hamstring stretch 2x20 sec bilat Seated piriformis stretch 2x20 sec bilat Sit to/from stand with chest press with 5# kettlebell x10 Seated hip abduction clamshells with red loop 2x10 Seated marching with red loop around thighs 2x10 Seated scapular retraction x10 with 5 sec hold Prone on elbows x2 min (reports some centralization of pain) Prone hip extension x10 bilat Seated transversus abdominus contraction by pressing peanut into thighs with hands 2x10 Seated lateral overhead stretch over peanut ball x10 bilat Seated shoulder ER with yellow tband 2x10 Seated shoulder horizontal abduction with yellow tband x10 (with cuing to decrease upper trap activation)  11/14/2023: Nustep level 5 x7 min with PT present to discuss status Seated hamstring stretch 2x20 sec bilat Seated piriformis stretch 2x20 sec bilat Sit to/from stand with chest press with 5# kettlebell x10 Seated hip abduction clamshells with red loop 2x10 Seated marching with red loop around thighs 2x10 Seated scapular retraction x10 with 5 sec hold Prone on elbows x2 min (reports some centralization of pain) Prone hip extension x10 bilat Seated transversus abdominus contraction by pressing peanut into thighs with hands 2x10 Seated lateral overhead stretch over peanut ball x10 bilat Seated shoulder ER with yellow tband 2x10 Seated shoulder horizontal abduction with yellow tband x10  (with cuing to decrease upper trap activation)   11/06/23/2025: Nustep level 3 x6 min with PT present to discuss status Seated hamstring stretch 2x20 sec bilat Seated piriformis stretch 2x20 sec bilat Seated hip abduction clamshells with red band 2x10 Seated marching with red loop around thighs 2x10 Seated scapular retraction x10 with 5 sec hold - attempted with red band but too much UT engagement Standing lumbar extension x5 no change in sx Standing lumbar extension with forearms on wall 2x5 - decreases pain to 3/10 from 5/10 Seated lumbo-thoracic extension with ball behind back with OH reach 2x10 Supine transversus abdominus contraction by pressing peanut into thighs with hands 2x10 Standing calf stretch on slanted rocker board x20 sec    10/29/2023: Nustep level 3 x6 min with PT present to discuss status Seated hamstring stretch 2x20 sec bilat Seated piriformis stretch 2x20 sec bilat Seated scapular retraction x10 with 5 sec hold Seated transversus abdominus contraction by pressing ball into thighs with hands 2x10 Seated lumbo-thoracic extension with ball behind back 2x10 Seated hip abduction clamshells with red loop 2x10 Seated marching with red loop around thighs 2x10 6 minute walk test:  1,137 ft with no increase of pain Standing calf stretch on slanted rocker board x20 sec Standing lumbar extension x5 with patient reporting less radicular leg pain following    PATIENT EDUCATION:  Education details: Issued HEP Person educated: Patient and Spouse Education method: Explanation, Facilities manager, and Handouts Education comprehension: verbalized understanding and returned demonstration  HOME EXERCISE PROGRAM: Access Code: [email or web address] URL: [email or web address] Date: 10/29/2023 Prepared by: Jarrell Laming  Exercises - Gastroc Stretch with Foot at Wall  - 1 x daily - 7 x weekly - 2 reps - 20 sec hold - Seated Scapular Retraction  - 1 x daily - 7 x weekly - 2 sets -  10 reps - Seated Hamstring Stretch  - 1 x daily - 7 x weekly - 2 reps - Seated Piriformis Stretch with Trunk Bend  - 1 x daily - 7 x weekly - 2 reps - 20 sec hold - Supine Lower Trunk Rotation  - 1 x daily - 7 x weekly -  1-2 sets - 10 reps - Supine Posterior Pelvic Tilt  - 1 x daily - 7 x weekly - 2 sets - 10 reps - Seated Transversus Abdominis Bracing  - 1 x daily - 7 x weekly - 2 sets - 10 reps - Standing Lumbar Extension  - 1 x daily - 7 x weekly - 1-2 sets - 10 reps  ASSESSMENT:  CLINICAL IMPRESSION: ***  OBJECTIVE IMPAIRMENTS: decreased balance, decreased coordination, difficulty walking, decreased strength, impaired flexibility, postural dysfunction, and pain.   ACTIVITY LIMITATIONS: carrying, lifting, bending, sitting, standing, squatting, and sleeping  PARTICIPATION LIMITATIONS: meal prep, cleaning, laundry, community activity, and occupation  PERSONAL FACTORS: Time since onset of injury/illness/exacerbation and 1 comorbidity: OA are also affecting patient's functional outcome.   REHAB POTENTIAL: Good  CLINICAL DECISION MAKING: Stable/uncomplicated  EVALUATION COMPLEXITY: Low   GOALS: Goals reviewed with patient? Yes  SHORT TERM GOALS: Target date: 11/13/23  Patient will be independent with initial HEP. Baseline: Goal status: Goal met on 11/14/23  2.  Patient will participate in a 6 minute walk test to establish a baseline measurement. Baseline:  Goal status: Goal Met on 10/29/23   LONG TERM GOALS: Target date: 12/13/2023  Patient will be independent with advanced HEP to allow for self progression after discharge. Baseline:  Goal status: INITIAL  2.  Patient will improve modified Oswestry to no greater than 55% to demonstrate improvements in functional tasks. Baseline: 70% Goal status: INITIAL  3.  Patient will increase left hip strength to [facility] to allow patient to navigate a curb and stairs. Baseline:  Goal status: Ongoing  4.  Patient will report ability  to work a shift of work pain no greater than 4/10. Baseline:  Goal status: INITIAL  5.  Patient will be able to demonstrate proper posture and body mechanics to be able to lift without increased pain. Baseline:  Goal status: Ongoing    PLAN:  PT FREQUENCY: 1-2x/week  PT DURATION: 8 weeks  PLANNED INTERVENTIONS: [id number]- PT Re-evaluation, 97110-Therapeutic exercises, 97530- Therapeutic activity, [id number]- Neuromuscular re-education, 97535- Self Care, [id number]- Manual therapy, [id number]- Gait training, [id number]- Canalith repositioning, [id number]- Aquatic Therapy, [phone]- Electrical stimulation (unattended), [phone]- Electrical stimulation (manual), [id number]- Vasopneumatic device, [id number]- Ultrasound, [id number]- Traction (mechanical), [id number]- Ionotophoresis 4mg /ml Dexamethasone , [id number] (1-2 muscles), [id number] (3+ muscles)- Dry Needling, Patient/Family education, Balance training, Stair training, Taping, Joint mobilization, Joint manipulation, Spinal manipulation, Spinal mobilization, Vestibular training, Cryotherapy, and Moist heat.  PLAN FOR NEXT SESSION: Assess and progress HEP as indicated, strengthening, flexibility, manual/dry needling as indicated    Mliss Cummins, PT  11/18/23, 9:53 PM  [facility] [address] [place], [place] [address] Phone # [phone] Fax [phone]

## 2023-11-19 ENCOUNTER — Ambulatory Visit

## 2023-11-19 ENCOUNTER — Telehealth: Payer: Self-pay | Admitting: Family Medicine

## 2023-11-19 DIAGNOSIS — M5459 Other low back pain: Secondary | ICD-10-CM | POA: Diagnosis not present

## 2023-11-19 DIAGNOSIS — R252 Cramp and spasm: Secondary | ICD-10-CM

## 2023-11-19 DIAGNOSIS — R262 Difficulty in walking, not elsewhere classified: Secondary | ICD-10-CM

## 2023-11-19 DIAGNOSIS — M6281 Muscle weakness (generalized): Secondary | ICD-10-CM

## 2023-11-19 DIAGNOSIS — R293 Abnormal posture: Secondary | ICD-10-CM

## 2023-11-19 NOTE — Therapy (Signed)
 OUTPATIENT PHYSICAL THERAPY TREATMENT NOTE   Patient Name: Erika Larson MRN: [id number] DOB:09-22-1970, 53 y.o., female Today's Date: 11/19/2023  END OF SESSION:  PT End of Session - 11/19/23 1614     Visit Number 5    Date for PT Re-Evaluation 12/13/23    Authorization Type [employer]    PT Start Time 1614    PT Stop Time 1708    PT Time Calculation (min) 54 min    Activity Tolerance Patient tolerated treatment well    Behavior During Therapy WFL for tasks assessed/performed           Past Medical History:  Diagnosis Date   Allergy    SEASONAL   Anemia    Anxiety    Arthritis    Depression    Gallstones 1998   GERD (gastroesophageal reflux disease)    Hyperlipidemia    Hypertension    Seizures (HCC)    AS A CHILD,BUT NOT AS A ADULT   Past Surgical History:  Procedure Laterality Date   CHOLECYSTECTOMY     CYSTOSCOPY Bilateral 02/25/2019   Procedure: CYSTOSCOPY;  Surgeon: Taam-Akelman, Rosalea K, MD;  Location: [facility];  Service: Gynecology;  Laterality: Bilateral;   ROBOTIC ASSISTED LAPAROSCOPIC HYSTERECTOMY AND SALPINGECTOMY Bilateral 02/25/2019   Procedure: XI ROBOTIC ASSISTED LAPAROSCOPIC HYSTERECTOMY AND SALPINGECTOMY;  Surgeon: Bonnielee Rayleen POUR, MD;  Location: [facility];  Service: Gynecology;  Laterality: Bilateral;   TUBAL LIGATION     WISDOM TOOTH EXTRACTION     Patient Active Problem List   Diagnosis Date Noted   Status post hysterectomy 02/25/2019   Essential hypertension 04/27/2018   Arthritis 04/27/2018   Cigarette nicotine dependence without complication 04/27/2018   Obesity with body mass index 30 or greater 08/07/2016   Menorrhagia 08/03/2016   Smoker 08/03/2016    PCP: Mercer Clotilda SAUNDERS, MD  REFERRING PROVIDER: Mercer Clotilda SAUNDERS, MD  REFERRING DIAG: M54.16 (ICD-10-CM) - Lumbar radiculopathy, acute M48.061 (ICD-10-CM) - Lumbar foraminal stenosis  Rationale for Evaluation and Treatment:  Rehabilitation  THERAPY DIAG:  Other low back pain  Muscle weakness (generalized)  Cramp and spasm  Difficulty in walking, not elsewhere classified  Abnormal posture  ONSET DATE: May 2025  SUBJECTIVE:                                                                                                                                                                                           SUBJECTIVE STATEMENT: Patient states she was doing well until Saturday when she had significant increase in pain.  Could not recall anything that would have set her pain off.  She continues to be in pain and is ambulating with antalgic gait and was antalgic on start up.     PERTINENT HISTORY:  OA, GERD  PAIN:  11/19/23 Are you having pain? Yes: NPRS scale: 8/10 Pain location: low back and down left leg Pain description: when it hits, it just hits.  Shocking radiating pain Aggravating factors: walking, lifting, standing Relieving factors: unknown  PRECAUTIONS: None  RED FLAGS: None   WEIGHT BEARING RESTRICTIONS: No  FALLS:  Has patient fallen in last 6 months? 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  NEXT MD VISIT: as needed pending progress with PT  OBJECTIVE:  Note: Objective measures were completed at Evaluation unless otherwise noted.  DIAGNOSTIC FINDINGS:  Lumbar MRI on 10/02/2023: IMPRESSION: 1. At L5-S1, moderate left and mild right foraminal stenosis. 2. At L4-L5, mild left foraminal stenosis with left foraminal and extraforaminal disc protrusion closely approximates the exiting/exited left L4 nerve. 3. At L3-L4, mild canal and bilateral subarticular recess stenosis.  PATIENT SURVEYS:  Modified  Oswestry:  MODIFIED OSWESTRY DISABILITY SCALE  Score Date: 10/21/23  Pain intensity 5 =  Pain medication has no effect on my pain.  2. Personal care (washing, dressing, etc.) 2 =  It is painful to take care of myself, and I am slow and careful.  3. Lifting 4 = I can lift only very light weights  4. Walking 3 =  Pain prevents me from walking more than  mile.  5. Sitting 2 =  Pain prevents me from sitting more than 1 hour.  6. Standing 4 =  Pain prevents me from standing more than 10 minutes.  7. Sleeping 5 =  Pain prevents me from sleeping at all.  8. Social Life 3 =  Pain prevents me from going out very often.  9. Traveling 3 = My pain restricts my travel over 1 hour  10. Employment/ Homemaking 4 = Pain prevents me from doing even light duties.  Total 35/50 = 70%   Interpretation of scores: Score Category Description  0-20% Minimal Disability The patient can cope with most living activities. Usually no treatment is indicated apart from advice on lifting, sitting and exercise  21-40% Moderate Disability The patient experiences more pain and difficulty with sitting, lifting and standing. Travel and social life are more difficult and they may be disabled from work. Personal care, sexual activity and sleeping are not grossly affected, and the patient can usually be managed by conservative means  41-60% Severe Disability Pain remains the main problem in this group, but activities of daily living are affected. These patients require a detailed investigation  61-80% Crippled Back pain impinges on all aspects of the patient's life. Positive intervention is required  81-100% Bed-bound  These patients are either bed-bound or exaggerating their symptoms  Bluford FORBES [name], et al. Surgery versus conservative management of stable thoracolumbar fracture: the PRESTO feasibility RCT. [place] ([place]): [employer]; 2021 Nov. [facility] Technology Assessment, No. 25.62.) Appendix 3, Oswestry  Disability Index category descriptors. Available from: [employer]cz  Minimally Clinically Important Difference (MCID) = 12.8%  COGNITION: Overall cognitive status: Within functional limits for tasks assessed     SENSATION: Patient reports numbness and tingling down left leg  MUSCLE  LENGTH: Hamstrings: Tightness bilateral (left more impaired than right)  POSTURE: rounded shoulders and forward head  PALPATION: Tightness and muscle spasms along cervical paraspinals, upper trap, and lumbar paraspinals  LUMBAR ROM:   Decreased at least 50% with increased pain  LOWER EXTREMITY ROM:     WFL  LOWER EXTREMITY MMT:    Eval: Left hip strength of 4/5 Right LE strength is WFL  LUMBAR SPECIAL TESTS:  Slump test: positive on the left side  FUNCTIONAL TESTS:  Eval: 5 times sit to stand: 13.37 sec Timed up and go (TUG): 9.34 sec  10/29/2023: 6 minute walk test:  1,137 ft with no increase of pain (age related norms 1,617 ft)  GAIT: Distance walked: >500 ft Assistive device utilized: None Level of assistance: Complete Independence Comments: Patient states that she starts having increased pain with ambulation  TREATMENT DATE:  11/19/2023: Nustep level 5 x7 min with PT present to discuss status, progress and set back Standing hamstring stretch 3 x 30 sec bilat Standing hip flexor/quad stretch 2 x 30 sec bil Seated piriformis stretch 2 x 30 sec bilat Prone lying x 2 min (closely monitoring for radicular symptoms) Educated on anatomy of  the spine, stenosis vs disc protrusion, importance of stretching and core strength Prone on elbows x 2 min (closely monitoring for radicular symptoms) Prone press up, modified to low range x 10 (closely monitoring for radicular symptoms) Ice to lumbar spine x 10 min in supine/hook lying with legs on wedge  11/14/2023: Nustep level 5 x7 min with PT present to discuss status Seated hamstring stretch 2x20 sec  bilat Seated piriformis stretch 2x20 sec bilat Sit to/from stand with chest press with 5# kettlebell x10 Seated hip abduction clamshells with red loop 2x10 Seated marching with red loop around thighs 2x10 Seated scapular retraction x10 with 5 sec hold Prone on elbows x2 min (reports some centralization of pain) Prone hip extension x10 bilat Seated transversus abdominus contraction by pressing peanut into thighs with hands 2x10 Seated lateral overhead stretch over peanut ball x10 bilat Seated shoulder ER with yellow tband 2x10 Seated shoulder horizontal abduction with yellow tband x10 (with cuing to decrease upper trap activation)   11/06/23/2025: Nustep level 3 x6 min with PT present to discuss status Seated hamstring stretch 2x20 sec bilat Seated piriformis stretch 2x20 sec bilat Seated hip abduction clamshells with red band 2x10 Seated marching with red loop around thighs 2x10 Seated scapular retraction x10 with 5 sec hold - attempted with red band but too much UT engagement Standing lumbar extension x5 no change in sx Standing lumbar extension with forearms on wall 2x5 - decreases pain to 3/10 from 5/10 Seated lumbo-thoracic extension with ball behind back with OH reach 2x10 Supine transversus abdominus contraction by pressing peanut into thighs with hands 2x10 Standing calf stretch on slanted rocker board x20 sec  ASSESSMENT:  CLINICAL IMPRESSION: Ms Cheaney had a set back over the weekend and was in pain upon arrival.  She admits she didn't do much in the way of exercise when she started hurting due to the pain being so elevated.  She was doing hot showers and fwd reach in the shower for relief.  We focused on stretching and Mckenzie extension today and ended with ice.  She reported decreased pain and moved about with much less antalgic gait upon exiting the clinic.  She would benefit from continued skilled PT for LE flexibility and core strengthening along with Mckenzie extension to  work toward goals below.  OBJECTIVE IMPAIRMENTS: decreased balance, decreased coordination, difficulty walking, decreased strength, impaired flexibility, postural dysfunction, and pain.   ACTIVITY LIMITATIONS: carrying, lifting, bending, sitting, standing, squatting, and sleeping  PARTICIPATION LIMITATIONS: meal prep, cleaning, laundry, community activity, and occupation  PERSONAL FACTORS: Time since onset of injury/illness/exacerbation and 1 comorbidity: OA are also affecting patient's functional outcome.   REHAB POTENTIAL: Good  CLINICAL DECISION MAKING: Stable/uncomplicated  EVALUATION COMPLEXITY: Low   GOALS: Goals reviewed with patient? Yes  SHORT TERM GOALS: Target date: 11/13/23  Patient will be independent with initial HEP. Baseline: Goal status: Goal met on 11/14/23  2.  Patient will participate in a 6 minute walk test to establish a baseline measurement. Baseline:  Goal status: Goal Met on 10/29/23   LONG TERM GOALS: Target date: 12/13/2023  Patient will be independent with advanced HEP to allow for self progression after discharge. Baseline:  Goal status: INITIAL  2.  Patient will improve modified Oswestry to no greater than 55% to demonstrate improvements in functional tasks. Baseline: 70% Goal status: INITIAL  3.  Patient will increase left hip strength to [facility] to allow patient to navigate a curb and stairs. Baseline:  Goal status: Ongoing  4.  Patient will report ability to work a shift of work pain no greater than 4/10. Baseline:  Goal status: INITIAL  5.  Patient will be able to demonstrate proper posture and body mechanics to be able to lift without increased pain. Baseline:  Goal status: Ongoing    PLAN:  PT FREQUENCY: 1-2x/week  PT DURATION: 8 weeks  PLANNED INTERVENTIONS: 97164- PT Re-evaluation, 97110-Therapeutic exercises, 97530- Therapeutic activity, [id number]- Neuromuscular re-education, 97535- Self Care, [id number]- Manual therapy, [id number]- Gait  training, [phone]- Canalith repositioning, [id number]- Aquatic Therapy, [phone]- Electrical stimulation (unattended), [phone]- Electrical stimulation (manual), [id number]- Vasopneumatic device, [id number]- Ultrasound, [id number]- Traction (mechanical), [id number]- Ionotophoresis 4mg /ml Dexamethasone , 79439 (1-2 muscles), 20561 (3+ muscles)- Dry Needling, Patient/Family education, Balance training, Stair training, Taping, Joint mobilization, Joint manipulation, Spinal manipulation, Spinal mobilization, Vestibular training, Cryotherapy, and Moist heat.  PLAN FOR NEXT SESSION: Assess response to ice.  Progress HEP as indicated, strengthening, flexibility, manual/dry needling as indicated    Jahmeek Shirk B. Thera Basden, PT 11/19/23 5:11 PM [facility] [address], [address] [place], [place] [address] Phone # [phone] Fax [phone]

## 2023-11-19 NOTE — Telephone Encounter (Signed)
 Have received forms

## 2023-11-19 NOTE — Telephone Encounter (Signed)
 Patient dropped off forms for FMLA. Forms in folder

## 2023-11-20 ENCOUNTER — Telehealth: Payer: Self-pay | Admitting: Family Medicine

## 2023-11-20 NOTE — Telephone Encounter (Unsigned)
 Copied from CRM #[id number]. Topic: General - Other >> Nov 20, 2023  1:29 PM Mercedes MATSU wrote: Reason for CRM: Patients husband Madiline Saffran called on behalf of the patient requesting that the paper work thats at the front desk in the clinic be faxed over to Dr. Burnetta Oz @ [facility] Fax number: [phone]. Patient requesting a call back once paperwork has been faxed.

## 2023-11-20 NOTE — Telephone Encounter (Signed)
 Called and spoke with patient, per Dr. Mercer Redfern would need to fill out FMLA paper work, patient is is aware and will pickup forms

## 2023-11-22 ENCOUNTER — Telehealth: Payer: Self-pay | Admitting: Physician Assistant

## 2023-11-22 NOTE — Telephone Encounter (Signed)
 Patient notified and will have her husband come by and pick it up next week.

## 2023-11-22 NOTE — Telephone Encounter (Signed)
 Patients spouse Curtistine is calling to see about his wife getting a temporary handicap placard.

## 2023-11-22 NOTE — Telephone Encounter (Signed)
 This has been filled out and placed up front in the black folder on the wall.

## 2023-11-26 ENCOUNTER — Ambulatory Visit: Admitting: Physical Therapy

## 2023-11-26 DIAGNOSIS — R262 Difficulty in walking, not elsewhere classified: Secondary | ICD-10-CM

## 2023-11-26 DIAGNOSIS — M6281 Muscle weakness (generalized): Secondary | ICD-10-CM

## 2023-11-26 DIAGNOSIS — M5459 Other low back pain: Secondary | ICD-10-CM | POA: Diagnosis not present

## 2023-11-26 DIAGNOSIS — M546 Pain in thoracic spine: Secondary | ICD-10-CM

## 2023-11-26 DIAGNOSIS — R252 Cramp and spasm: Secondary | ICD-10-CM

## 2023-11-26 DIAGNOSIS — R293 Abnormal posture: Secondary | ICD-10-CM

## 2023-11-26 NOTE — Therapy (Signed)
 OUTPATIENT PHYSICAL THERAPY TREATMENT NOTE   Patient Name: Erika Larson MRN: [id number] DOB:February 18, 1971, 53 y.o., female Today's Date: 11/26/2023  END OF SESSION:  PT End of Session - 11/26/23 1624     Visit Number 6    Date for PT Re-Evaluation 12/13/23    Authorization Type [employer]    PT Start Time 1620    PT Stop Time 1703    PT Time Calculation (min) 43 min    Activity Tolerance Patient tolerated treatment well    Behavior During Therapy WFL for tasks assessed/performed           Past Medical History:  Diagnosis Date   Allergy    SEASONAL   Anemia    Anxiety    Arthritis    Depression    Gallstones 1998   GERD (gastroesophageal reflux disease)    Hyperlipidemia    Hypertension    Seizures (HCC)    AS A CHILD,BUT NOT AS A ADULT   Past Surgical History:  Procedure Laterality Date   CHOLECYSTECTOMY     CYSTOSCOPY Bilateral 02/25/2019   Procedure: CYSTOSCOPY;  Surgeon: Taam-Akelman, Rosalea K, MD;  Location: [facility];  Service: Gynecology;  Laterality: Bilateral;   ROBOTIC ASSISTED LAPAROSCOPIC HYSTERECTOMY AND SALPINGECTOMY Bilateral 02/25/2019   Procedure: XI ROBOTIC ASSISTED LAPAROSCOPIC HYSTERECTOMY AND SALPINGECTOMY;  Surgeon: Bonnielee Rayleen POUR, MD;  Location:  SURGERY CENTER;  Service: Gynecology;  Laterality: Bilateral;   TUBAL LIGATION     WISDOM TOOTH EXTRACTION     Patient Active Problem List   Diagnosis Date Noted   Status post hysterectomy 02/25/2019   Essential hypertension 04/27/2018   Arthritis 04/27/2018   Cigarette nicotine dependence without complication 04/27/2018   Obesity with body mass index 30 or greater 08/07/2016   Menorrhagia 08/03/2016   Smoker 08/03/2016    PCP: Mercer Clotilda SAUNDERS, MD  REFERRING PROVIDER: Mercer Clotilda SAUNDERS, MD  REFERRING DIAG: M54.16 (ICD-10-CM) - Lumbar radiculopathy, acute M48.061 (ICD-10-CM) - Lumbar foraminal stenosis  Rationale for Evaluation and Treatment:  Rehabilitation  THERAPY DIAG:  Other low back pain  Pain in thoracic spine  Muscle weakness (generalized)  Difficulty in walking, not elsewhere classified  Cramp and spasm  Abnormal posture  ONSET DATE: May 2025  SUBJECTIVE:                                                                                                                                                                                           SUBJECTIVE STATEMENT: Patient states she is a little better.  Still hurting in lower back but sharp pain is only at night now.  Having  a little pain in the right thoracic area.  Pain reported at 3/10 upon arrival.     PERTINENT HISTORY:  OA, GERD  PAIN:  11/26/23 Are you having pain? Yes: NPRS scale: 3/10 Pain location: low back and down left leg Pain description: when it hits, it just hits.  Shocking radiating pain Aggravating factors: walking, lifting, standing Relieving factors: unknown  PRECAUTIONS: None  RED FLAGS: None   WEIGHT BEARING RESTRICTIONS: No  FALLS:  Has patient fallen in last 6 months? 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  NEXT MD VISIT: as needed pending progress with PT  OBJECTIVE:  Note: Objective measures were completed at Evaluation unless otherwise noted.  DIAGNOSTIC FINDINGS:  Lumbar MRI on 10/02/2023: IMPRESSION: 1. At L5-S1, moderate left and mild right foraminal stenosis. 2. At L4-L5, mild left foraminal stenosis with left foraminal and extraforaminal disc protrusion closely approximates the exiting/exited left L4 nerve. 3. At L3-L4, mild canal and bilateral subarticular recess stenosis.  PATIENT SURVEYS:  Modified Oswestry:  MODIFIED OSWESTRY  DISABILITY SCALE  Score Date: 10/21/23  Pain intensity 5 =  Pain medication has no effect on my pain.  2. Personal care (washing, dressing, etc.) 2 =  It is painful to take care of myself, and I am slow and careful.  3. Lifting 4 = I can lift only very light weights  4. Walking 3 =  Pain prevents me from walking more than  mile.  5. Sitting 2 =  Pain prevents me from sitting more than 1 hour.  6. Standing 4 =  Pain prevents me from standing more than 10 minutes.  7. Sleeping 5 =  Pain prevents me from sleeping at all.  8. Social Life 3 =  Pain prevents me from going out very often.  9. Traveling 3 = My pain restricts my travel over 1 hour  10. Employment/ Homemaking 4 = Pain prevents me from doing even light duties.  Total 35/50 = 70%   Interpretation of scores: Score Category Description  0-20% Minimal Disability The patient can cope with most living activities. Usually no treatment is indicated apart from advice on lifting, sitting and exercise  21-40% Moderate Disability The patient experiences more pain and difficulty with sitting, lifting and standing. Travel and social life are more difficult and they may be disabled from work. Personal care, sexual activity and sleeping are not grossly affected, and the patient can usually be managed by conservative means  41-60% Severe Disability Pain remains the main problem in this group, but activities of daily living are affected. These patients require a detailed investigation  61-80% Crippled Back pain impinges on all aspects of the patient's life. Positive intervention is required  81-100% Bed-bound  These patients are either bed-bound or exaggerating their symptoms  Bluford FORBES [name], et al. Surgery versus conservative management of stable thoracolumbar fracture: the PRESTO feasibility RCT. [place] ([place]): [employer]; 2021 Nov. [facility] Technology Assessment, No. 25.62.) Appendix 3, Oswestry Disability Index category  descriptors. Available from: FindJewelers.cz  Minimally Clinically Important Difference (MCID) = 12.8%  COGNITION: Overall cognitive status: Within functional limits for tasks assessed     SENSATION: Patient reports numbness and tingling down left leg  MUSCLE LENGTH: Hamstrings: Tightness  bilateral (left more impaired than right)  POSTURE: rounded shoulders and forward head  PALPATION: Tightness and muscle spasms along cervical paraspinals, upper trap, and lumbar paraspinals  LUMBAR ROM:   Decreased at least 50% with increased pain  LOWER EXTREMITY ROM:     WFL  LOWER EXTREMITY MMT:    Eval: Left hip strength of 4/5 Right LE strength is WFL  LUMBAR SPECIAL TESTS:  Slump test: positive on the left side  FUNCTIONAL TESTS:  Eval: 5 times sit to stand: 13.37 sec Timed up and go (TUG): 9.34 sec  10/29/2023: 6 minute walk test:  1,137 ft with no increase of pain (age related norms 1,617 ft)  GAIT: Distance walked: >500 ft Assistive device utilized: None Level of assistance: Complete Independence Comments: Patient states that she starts having increased pain with ambulation  PATIENT EDUCATION: Access Code: [email or web address] URL: [email or web address] Date: 11/26/2023 Prepared by: Delon Haddock  Exercises - Standing Hamstring Stretch on Chair  - 1 x daily - 7 x weekly - 1 sets - 3 reps - 30 sec hold - Quadricep Stretch with Chair and Counter Support  - 1 x daily - 7 x weekly - 1 sets - 3 reps - 30 sec hold - Seated Figure 4 Piriformis Stretch  - 1 x daily - 7 x weekly - 1 sets - 3 reps - 30 sed hold - Supine 90/90 Alternating Heel Touches with Posterior Pelvic Tilt  - 1 x daily - 7 x weekly - 3 sets - 10 reps - Supine Dead Bug with Leg Extension  - 1 x daily - 7 x weekly - 3 sets - 10 reps   TREATMENT DATE:  11/26/2023: Nustep level 5 x 6 min with PT present to discuss status, progress and set back Standing hamstring stretch  3 x 30 sec bilat Standing hip flexor/quad stretch 3 x 30 sec bil Seated piriformis stretch 2 x 30 sec bilat Prone on elbows x 2 min (closely monitoring for radicular symptoms) Prone press up, modified to low range x 10 (closely monitoring for radicular symptoms) PPT x 10 PPT with 90/90 heel tap Dying bug x 20 Declined ice   11/19/2023: Nustep level 5 x7 min with PT present to discuss status, progress and set back Standing hamstring stretch 3 x 30 sec bilat Standing hip flexor/quad stretch 2 x 30 sec bil Seated piriformis stretch 2 x 30 sec bilat Prone lying x 2 min (closely monitoring for radicular symptoms) Educated on anatomy of  the spine, stenosis vs disc protrusion, importance of stretching and core strength Prone on elbows x 2 min (closely monitoring for radicular symptoms) Prone press up, modified to low range x 10 (closely monitoring for radicular symptoms) Ice to lumbar spine x 10 min in supine/hook lying with legs on wedge  11/14/2023: Nustep level 5 x7 min with PT present to discuss status Seated hamstring stretch 2x20 sec bilat Seated piriformis stretch 2x20 sec bilat Sit to/from stand with chest press with 5# kettlebell x10 Seated hip abduction clamshells with red loop 2x10 Seated marching with red loop around thighs 2x10 Seated scapular retraction x10 with 5 sec hold Prone on elbows x2 min (reports some centralization of pain) Prone hip extension x10 bilat Seated transversus abdominus contraction by pressing peanut into thighs with hands 2x10 Seated lateral overhead stretch over peanut ball x10 bilat Seated shoulder ER with yellow tband 2x10 Seated shoulder horizontal abduction with yellow tband x10 (with cuing to decrease upper trap activation)  ASSESSMENT:  CLINICAL IMPRESSION:  Ms Corbridge is showing steady response to treatment.  She is moving a lot less guarded today and had no severe spasms while doing treatment today.  We added some challenging core work.  She  was able to demonstrate excellent technique with PPT so we initiated unsupported 90/90 heel taps and dying bug unsupported.  No increased pain at end of session.  She declined ice but said she would use it at home if she became sore and painful.   She would benefit from continued skilled PT for LE flexibility and core strengthening along with Mckenzie extension to work toward goals below.   OBJECTIVE IMPAIRMENTS: decreased balance, decreased coordination, difficulty walking, decreased strength, impaired flexibility, postural dysfunction, and pain.   ACTIVITY LIMITATIONS: carrying, lifting, bending, sitting, standing, squatting, and sleeping  PARTICIPATION LIMITATIONS: meal prep, cleaning, laundry, community activity, and occupation  PERSONAL FACTORS: Time since onset of injury/illness/exacerbation and 1 comorbidity: OA are also affecting patient's functional outcome.   REHAB POTENTIAL: Good  CLINICAL DECISION MAKING: Stable/uncomplicated  EVALUATION COMPLEXITY: Low   GOALS: Goals reviewed with patient? Yes  SHORT TERM GOALS: Target date: 11/13/23  Patient will be independent with initial HEP. Baseline: Goal status: Goal met on 11/14/23  2.  Patient will participate in a 6 minute walk test to establish a baseline measurement. Baseline:  Goal status: Goal Met on 10/29/23   LONG TERM GOALS: Target date: 12/13/2023  Patient will be independent with advanced HEP to allow for self progression after discharge. Baseline:  Goal status: INITIAL  2.  Patient will improve modified Oswestry to no greater than 55% to demonstrate improvements in functional tasks. Baseline: 70% Goal status: INITIAL  3.  Patient will increase left hip strength to [facility] to allow patient to navigate a curb and stairs. Baseline:  Goal status: Ongoing  4.  Patient will report ability to work a shift of work pain no greater than 4/10. Baseline:  Goal status: INITIAL  5.  Patient will be able to demonstrate  proper posture and body mechanics to be able to lift without increased pain. Baseline:  Goal status: Ongoing    PLAN:  PT FREQUENCY: 1-2x/week  PT DURATION: 8 weeks  PLANNED INTERVENTIONS: [id number]- PT Re-evaluation, 97110-Therapeutic exercises, 97530- Therapeutic activity, [id number]- Neuromuscular re-education, 97535- Self Care, [id number]- Manual therapy, [id number]- Gait training, [phone]- Canalith repositioning, [id number]- Aquatic Therapy, [phone]- Electrical stimulation (unattended), [phone]- Electrical stimulation (manual), [id number]- Vasopneumatic device, [id number]- Ultrasound, [id number]- Traction (mechanical), [id number]- Ionotophoresis 4mg /ml Dexamethasone , [id number] (1-2 muscles), [id number] (3+ muscles)- Dry Needling, Patient/Family education, Balance training, Stair training, Taping, Joint mobilization, Joint manipulation, Spinal manipulation, Spinal mobilization, Vestibular training, Cryotherapy, and Moist heat.  PLAN FOR NEXT SESSION: Core strengthening, flexibility, manual/dry needling as indicated    Johnell Bas B. Pyper Olexa, PT 11/26/23 5:09 PM [facility] [address] [place], [place] [address] Phone # [phone] Fax [phone]

## 2023-12-04 ENCOUNTER — Ambulatory Visit: Admitting: Physical Therapy

## 2023-12-04 ENCOUNTER — Ambulatory Visit: Attending: Family Medicine | Admitting: Rehabilitation

## 2023-12-04 ENCOUNTER — Encounter: Payer: Self-pay | Admitting: Rehabilitation

## 2023-12-04 DIAGNOSIS — M5459 Other low back pain: Secondary | ICD-10-CM | POA: Diagnosis present

## 2023-12-04 DIAGNOSIS — M6281 Muscle weakness (generalized): Secondary | ICD-10-CM | POA: Diagnosis present

## 2023-12-04 DIAGNOSIS — R262 Difficulty in walking, not elsewhere classified: Secondary | ICD-10-CM | POA: Diagnosis present

## 2023-12-04 DIAGNOSIS — R293 Abnormal posture: Secondary | ICD-10-CM | POA: Insufficient documentation

## 2023-12-04 DIAGNOSIS — R252 Cramp and spasm: Secondary | ICD-10-CM | POA: Diagnosis present

## 2023-12-04 DIAGNOSIS — R2689 Other abnormalities of gait and mobility: Secondary | ICD-10-CM | POA: Insufficient documentation

## 2023-12-04 DIAGNOSIS — M546 Pain in thoracic spine: Secondary | ICD-10-CM | POA: Insufficient documentation

## 2023-12-04 NOTE — Therapy (Signed)
 OUTPATIENT PHYSICAL THERAPY TREATMENT NOTE   Patient Name: Erika Larson MRN: [id number] DOB:1971-03-16, 53 y.o., female Today's Date: 12/04/2023  END OF SESSION:  PT End of Session - 12/04/23 1603     Visit Number 7    Date for PT Re-Evaluation 12/13/23    PT Start Time 0405    PT Stop Time 0445    PT Time Calculation (min) 40 min    Activity Tolerance Patient tolerated treatment well    Behavior During Therapy WFL for tasks assessed/performed           Past Medical History:  Diagnosis Date   Allergy    SEASONAL   Anemia    Anxiety    Arthritis    Depression    Gallstones 1998   GERD (gastroesophageal reflux disease)    Hyperlipidemia    Hypertension    Seizures (HCC)    AS A CHILD,BUT NOT AS A ADULT   Past Surgical History:  Procedure Laterality Date   CHOLECYSTECTOMY     CYSTOSCOPY Bilateral 02/25/2019   Procedure: CYSTOSCOPY;  Surgeon: Taam-Akelman, Rosalea K, MD;  Location: [facility];  Service: Gynecology;  Laterality: Bilateral;   ROBOTIC ASSISTED LAPAROSCOPIC HYSTERECTOMY AND SALPINGECTOMY Bilateral 02/25/2019   Procedure: XI ROBOTIC ASSISTED LAPAROSCOPIC HYSTERECTOMY AND SALPINGECTOMY;  Surgeon: Bonnielee Rayleen POUR, MD;  Location: [facility];  Service: Gynecology;  Laterality: Bilateral;   TUBAL LIGATION     WISDOM TOOTH EXTRACTION     Patient Active Problem List   Diagnosis Date Noted   Status post hysterectomy 02/25/2019   Essential hypertension 04/27/2018   Arthritis 04/27/2018   Cigarette nicotine dependence without complication 04/27/2018   Obesity with body mass index 30 or greater 08/07/2016   Menorrhagia 08/03/2016   Smoker 08/03/2016    PCP: Mercer Clotilda SAUNDERS, MD  REFERRING PROVIDER: Mercer Clotilda SAUNDERS, MD  REFERRING DIAG: M54.16 (ICD-10-CM) - Lumbar radiculopathy, acute M48.061 (ICD-10-CM) - Lumbar foraminal stenosis  Rationale for Evaluation and Treatment: Rehabilitation  THERAPY DIAG:   Other low back pain  Pain in thoracic spine  Muscle weakness (generalized)  Difficulty in walking, not elsewhere classified  Cramp and spasm  Abnormal posture  Other abnormalities of gait and mobility  ONSET DATE: May 2025  SUBJECTIVE:                                                                                                                                                                                           SUBJECTIVE STATEMENT: It hasn't been bothering me much this week.  My right foot started hurting yesterday after walking at my ankle bone.  It also felt kind of numb.  (Xrays and MRIs showed OA of the foot,  flat foot, achilles tendinopathy and plantar fasciitis.    PERTINENT HISTORY:  OA, GERD  PAIN:  Are you having pain? Yes: NPRS scale: 1-2/10 Pain location: Rt leg  Pain description: when it hits, it just hits.  Shocking radiating pain Aggravating factors: walking, lifting, standing Relieving factors: unknown  PRECAUTIONS: None  RED FLAGS: None   WEIGHT BEARING RESTRICTIONS: No  FALLS:  Has patient fallen in last 6 months? 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  NEXT MD VISIT: as needed pending progress with PT  OBJECTIVE:  Note: Objective measures were completed at Evaluation unless otherwise noted.  DIAGNOSTIC FINDINGS:  Lumbar MRI on 10/02/2023: IMPRESSION: 1. At L5-S1, moderate left and mild right foraminal stenosis. 2. At L4-L5, mild left foraminal stenosis with left foraminal and extraforaminal disc protrusion closely approximates the exiting/exited left L4 nerve. 3. At L3-L4, mild canal and bilateral subarticular recess stenosis.  PATIENT SURVEYS:  Modified  Oswestry:  MODIFIED OSWESTRY DISABILITY SCALE  Score Date: 10/21/23  Pain intensity 5 =  Pain medication has no effect on my pain.  2. Personal care (washing, dressing, etc.) 2 =  It is painful to take care of myself, and I am slow and careful.  3. Lifting 4 = I can lift only very light weights  4. Walking 3 =  Pain prevents me from walking more than  mile.  5. Sitting 2 =  Pain prevents me from sitting more than 1 hour.  6. Standing 4 =  Pain prevents me from standing more than 10 minutes.  7. Sleeping 5 =  Pain prevents me from sleeping at all.  8. Social Life 3 =  Pain prevents me from going out very often.  9. Traveling 3 = My pain restricts my travel over 1 hour  10. Employment/ Homemaking 4 = Pain prevents me from doing even light duties.  Total 35/50 = 70%   Interpretation of scores: Score Category Description  0-20% Minimal Disability The patient can cope with most living activities. Usually no treatment is indicated apart from advice on lifting, sitting and exercise  21-40% Moderate Disability The patient experiences more pain and difficulty with sitting, lifting and standing. Travel and social life are more difficult and they may be disabled from work. Personal care, sexual activity and sleeping are not grossly affected, and the patient can usually be managed by conservative means  41-60% Severe Disability Pain remains the main problem in this group, but activities of daily living are affected. These patients require a detailed investigation  61-80% Crippled Back pain impinges on all aspects of the patient's life. Positive intervention is required  81-100% Bed-bound  These patients are either bed-bound or exaggerating their symptoms  Bluford FORBES [name], et al. Surgery versus conservative management of stable thoracolumbar fracture: the PRESTO feasibility RCT. [place]): [employer]; 2021 Nov. [facility] Technology Assessment, No. 25.62.) Appendix 3, Oswestry  Disability Index category descriptors. Available from: FindJewel[employer].cz  Minimally Clinically Important Difference (MCID) = 12.8%  COGNITION: Overall cognitive status: Within functional limits for tasks assessed     SENSATION: Patient reports numbness and tingling down left leg  MUSCLE  LENGTH: Hamstrings: Tightness bilateral (left more impaired than right)  POSTURE: rounded shoulders and forward head  PALPATION: Tightness and muscle spasms along cervical paraspinals, upper trap, and lumbar paraspinals  LUMBAR ROM:   Decreased at least 50% with increased pain  LOWER EXTREMITY ROM:     WFL  LOWER EXTREMITY MMT:    Eval: Left hip strength of 4/5 Right LE strength is WFL  LUMBAR SPECIAL TESTS:  Slump test: positive on the left side  FUNCTIONAL TESTS:  Eval: 5 times sit to stand: 13.37 sec Timed up and go (TUG): 9.34 sec  10/29/2023: 6 minute walk test:  1,137 ft with no increase of pain (age related norms 1,617 ft)  GAIT: Distance walked: >500 ft Assistive device utilized: None Level of assistance: Complete Independence Comments: Patient states that she starts having increased pain with ambulation  PATIENT EDUCATION: Access Code: [email or web address] URL: [email or web address] Date: 11/26/2023 Prepared by: Delon Haddock  Exercises - Standing Hamstring Stretch on Chair  - 1 x daily - 7 x weekly - 1 sets - 3 reps - 30 sec hold - Quadricep Stretch with Chair and Counter Support  - 1 x daily - 7 x weekly - 1 sets - 3 reps - 30 sec hold - Seated Figure 4 Piriformis Stretch  - 1 x daily - 7 x weekly - 1 sets - 3 reps - 30 sed hold - Supine 90/90 Alternating Heel Touches with Posterior Pelvic Tilt  - 1 x daily - 7 x weekly - 3 sets - 10 reps - Supine Dead Bug with Leg Extension  - 1 x daily - 7 x weekly - 3 sets - 10 reps   TREATMENT DATE:  12/03/2023: Nustep level 5 x 6 min with PT present to discuss status and foot  Examined Rt  ankle/foot which is swollen with a small peach pit sized swollen place on the medial ankle. Not tender.  Recommended ice and compression stocking which pt has.  Hx of pain in the same ankle which pt attributed to OA Seated hamstring stretch 3 x 30 sec bilat (Rt tighter than the Left) Standing hip flexor/quad stretch 3 x 30 sec bil Seated piriformis stretch 2 x 30 sec bilat Prone on elbows x 2 min for centralization  Prone press up, modified to low range x 10 (closely monitoring for radicular symptoms) Manual Quad stretch 2x20 bil  PPT x 10 in supine (pt doesn't remember ever being supine but we tried it here due to exercises listed)  PPT with 90/90 heel tap x 10 with cueing to breathe and keep tilt throughout  Dying bug x 20 Declined ice   11/26/2023: Nustep level 5 x 6 min with PT present to discuss status, progress and set back Standing hamstring stretch 3 x 30 sec bilat Standing hip flexor/quad stretch 3 x 30 sec bil Seated piriformis stretch 2 x 30 sec bilat Prone on elbows x 2 min (closely monitoring for radicular symptoms) Prone press up, modified to low range x 10 (closely monitoring for radicular symptoms) PPT x 10 PPT with 90/90 heel tap Dying bug x 20 Declined ice   11/19/2023: Nustep level 5 x7 min with PT present to discuss status, progress and set back Standing hamstring stretch 3 x 30 sec bilat Standing hip flexor/quad stretch 2 x 30 sec bil Seated piriformis stretch 2 x 30 sec bilat Prone lying x 2 min (closely monitoring for radicular symptoms) Educated on anatomy of  the spine, stenosis vs disc protrusion, importance of  stretching and core strength Prone on elbows x 2 min (closely monitoring for radicular symptoms) Prone press up, modified to low range x 10 (closely monitoring for radicular symptoms) Ice to lumbar spine x 10 min in supine/hook lying with legs on wedge  11/14/2023: Nustep level 5 x7 min with PT present to discuss status Seated hamstring stretch 2x20  sec bilat Seated piriformis stretch 2x20 sec bilat Sit to/from stand with chest press with 5# kettlebell x10 Seated hip abduction clamshells with red loop 2x10 Seated marching with red loop around thighs 2x10 Seated scapular retraction x10 with 5 sec hold Prone on elbows x2 min (reports some centralization of pain) Prone hip extension x10 bilat Seated transversus abdominus contraction by pressing peanut into thighs with hands 2x10 Seated lateral overhead stretch over peanut ball x10 bilat Seated shoulder ER with yellow tband 2x10 Seated shoulder horizontal abduction with yellow tband x10 (with cuing to decrease upper trap activation)  ASSESSMENT:  CLINICAL IMPRESSION: Ms Stapleton arrives with her back better and not bothering her much this week but with continued pain and tightness in the Rt leg and new pain and swelling in the Rt ankle and foot that possibly started after walking up and down the hall.  She has a hx of Rt ankle and foot pain and was diagnosed with plantar fasciitis, achilles tendonitis, and flat feet. No increased pain at end of session.  She declined ice but said she would use it at home if she became sore and painful.   She would benefit from continued skilled PT for LE flexibility and core strengthening along with Mckenzie extension to work toward goals below.   OBJECTIVE IMPAIRMENTS: decreased balance, decreased coordination, difficulty walking, decreased strength, impaired flexibility, postural dysfunction, and pain.   ACTIVITY LIMITATIONS: carrying, lifting, bending, sitting, standing, squatting, and sleeping  PARTICIPATION LIMITATIONS: meal prep, cleaning, laundry, community activity, and occupation  PERSONAL FACTORS: Time since onset of injury/illness/exacerbation and 1 comorbidity: OA are also affecting patient's functional outcome.   REHAB POTENTIAL: Good  CLINICAL DECISION MAKING: Stable/uncomplicated  EVALUATION COMPLEXITY: Low   GOALS: Goals reviewed  with patient? Yes  SHORT TERM GOALS: Target date: 11/13/23  Patient will be independent with initial HEP. Baseline: Goal status: Goal met on 11/14/23  2.  Patient will participate in a 6 minute walk test to establish a baseline measurement. Baseline:  Goal status: Goal Met on 10/29/23   LONG TERM GOALS: Target date: 12/13/2023  Patient will be independent with advanced HEP to allow for self progression after discharge. Baseline:  Goal status: INITIAL  2.  Patient will improve modified Oswestry to no greater than 55% to demonstrate improvements in functional tasks. Baseline: 70% Goal status: INITIAL  3.  Patient will increase left hip strength to [facility] to allow patient to navigate a curb and stairs. Baseline:  Goal status: Ongoing  4.  Patient will report ability to work a shift of work pain no greater than 4/10. Baseline:  Goal status: INITIAL  5.  Patient will be able to demonstrate proper posture and body mechanics to be able to lift without increased pain. Baseline:  Goal status: Ongoing    PLAN:  PT FREQUENCY: 1-2x/week  PT DURATION: 8 weeks  PLANNED INTERVENTIONS: 97164- PT Re-evaluation, 97110-Therapeutic exercises, 97530- Therapeutic activity, [id number]- Neuromuscular re-education, 97535- Self Care, [id number]- Manual therapy, [id number]- Gait training, [id number]- Canalith repositioning, [id number]- Aquatic Therapy, [id number]- Electrical stimulation (unattended), [id number]- Electrical stimulation (manual), [id number]- Vasopneumatic device, [id number]- Ultrasound, [id number]- Traction (mechanical), [id number]- Ionotophoresis 4mg /ml Dexamethasone ,  79439 (1-2 muscles), [id number] (3+ muscles)- Dry Needling, Patient/Family education, Balance training, Stair training, Taping, Joint mobilization, Joint manipulation, Spinal manipulation, Spinal mobilization, Vestibular training, Cryotherapy, and Moist heat.  PLAN FOR NEXT SESSION: Core strengthening, flexibility, manual/dry needling as indicated    Jennifer B. Fields,  PT 12/04/23 5:01 PM [facility] [address] [place], [place] [address] Phone # [phone] Fax [phone]

## 2023-12-11 ENCOUNTER — Ambulatory Visit

## 2023-12-11 DIAGNOSIS — M6281 Muscle weakness (generalized): Secondary | ICD-10-CM

## 2023-12-11 DIAGNOSIS — M5459 Other low back pain: Secondary | ICD-10-CM | POA: Diagnosis not present

## 2023-12-11 DIAGNOSIS — R252 Cramp and spasm: Secondary | ICD-10-CM

## 2023-12-11 DIAGNOSIS — R262 Difficulty in walking, not elsewhere classified: Secondary | ICD-10-CM

## 2023-12-11 DIAGNOSIS — M546 Pain in thoracic spine: Secondary | ICD-10-CM

## 2023-12-11 DIAGNOSIS — R293 Abnormal posture: Secondary | ICD-10-CM

## 2023-12-11 NOTE — Therapy (Signed)
 OUTPATIENT PHYSICAL THERAPY TREATMENT NOTE   Patient Name: Erika Larson MRN: [id number] DOB:01/20/1971, 53 y.o., female Today's Date: 12/11/2023  END OF SESSION:  PT End of Session - 12/11/23 1613     Visit Number 8    Date for PT Re-Evaluation 12/13/23    Authorization Type [employer]    PT Start Time 1613    PT Stop Time 1700    PT Time Calculation (min) 47 min    Activity Tolerance Patient tolerated treatment well    Behavior During Therapy WFL for tasks assessed/performed           Past Medical History:  Diagnosis Date   Allergy    SEASONAL   Anemia    Anxiety    Arthritis    Depression    Gallstones 1998   GERD (gastroesophageal reflux disease)    Hyperlipidemia    Hypertension    Seizures (HCC)    AS A CHILD,BUT NOT AS A ADULT   Past Surgical History:  Procedure Laterality Date   CHOLECYSTECTOMY     CYSTOSCOPY Bilateral 02/25/2019   Procedure: CYSTOSCOPY;  Surgeon: Taam-Akelman, Rosalea K, MD;  Location: [facility];  Service: Gynecology;  Laterality: Bilateral;   ROBOTIC ASSISTED LAPAROSCOPIC HYSTERECTOMY AND SALPINGECTOMY Bilateral 02/25/2019   Procedure: XI ROBOTIC ASSISTED LAPAROSCOPIC HYSTERECTOMY AND SALPINGECTOMY;  Surgeon: Bonnielee Rayleen POUR, MD;  Location: [facility];  Service: Gynecology;  Laterality: Bilateral;   TUBAL LIGATION     WISDOM TOOTH EXTRACTION     Patient Active Problem List   Diagnosis Date Noted   Status post hysterectomy 02/25/2019   Essential hypertension 04/27/2018   Arthritis 04/27/2018   Cigarette nicotine dependence without complication 04/27/2018   Obesity with body mass index 30 or greater 08/07/2016   Menorrhagia 08/03/2016   Smoker 08/03/2016    PCP: Mercer Clotilda SAUNDERS, MD  REFERRING PROVIDER: Mercer Clotilda SAUNDERS, MD  REFERRING DIAG: M54.16 (ICD-10-CM) - Lumbar radiculopathy, acute M48.061 (ICD-10-CM) - Lumbar foraminal stenosis  Rationale for Evaluation and Treatment:  Rehabilitation  THERAPY DIAG:  Other low back pain - Plan: PT plan of care cert/re-cert  Pain in thoracic spine - Plan: PT plan of care cert/re-cert  Muscle weakness (generalized) - Plan: PT plan of care cert/re-cert  Difficulty in walking, not elsewhere classified - Plan: PT plan of care cert/re-cert  Cramp and spasm - Plan: PT plan of care cert/re-cert  Abnormal posture - Plan: PT plan of care cert/re-cert  ONSET DATE: May 2025  SUBJECTIVE:  SUBJECTIVE STATEMENT: Patient reports she was trying to get away from a hornet and twisted her back really bad.  Its been several days and I'm a little better but I'm still really sore.     PERTINENT HISTORY:  OA, GERD  PAIN:  12/11/23 Are you having pain? Yes: NPRS scale: 4/10 Pain location: Rt leg  Pain description: when it hits, it just hits.  Shocking radiating pain Aggravating factors: walking, lifting, standing Relieving factors: unknown  PRECAUTIONS: None  RED FLAGS: None   WEIGHT BEARING RESTRICTIONS: No  FALLS:  Has patient fallen in last 6 months? 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  NEXT MD VISIT: as needed pending progress with PT  OBJECTIVE:  Note: Objective measures were completed at Evaluation unless otherwise noted.  DIAGNOSTIC FINDINGS:  Lumbar MRI on 10/02/2023: IMPRESSION: 1. At L5-S1, moderate left and mild right foraminal stenosis. 2. At L4-L5, mild left foraminal stenosis with left foraminal and extraforaminal disc protrusion closely  approximates the exiting/exited left L4 nerve. 3. At L3-L4, mild canal and bilateral subarticular recess stenosis.  PATIENT SURVEYS:  Modified Oswestry:  MODIFIED OSWESTRY DISABILITY SCALE   Score Date: 10/21/23 12/11/23  Pain intensity 5 =  Pain medication has no effect on my pain. 3  2. Personal care (washing, dressing, etc.) 2 =  It is painful to take care of myself, and I am slow and careful. 1  3. Lifting 4 = I can lift only very light weights 1  4. Walking 3 =  Pain prevents me from walking more than  mile. 2  5. Sitting 2 =  Pain prevents me from sitting more than 1 hour. 3  6. Standing 4 =  Pain prevents me from standing more than 10 minutes. 1  7. Sleeping 5 =  Pain prevents me from sleeping at all. 2  8. Social Life 3 =  Pain prevents me from going out very often. 4  9. Traveling 3 = My pain restricts my travel over 1 hour 3  10. Employment/ Homemaking 4 = Pain prevents me from doing even light duties. 4  Total 35/50 = 70% 23/50 = 46%   Interpretation of scores: Score Category Description  0-20% Minimal Disability The patient can cope with most living activities. Usually no treatment is indicated apart from advice on lifting, sitting and exercise  21-40% Moderate Disability The patient experiences more pain and difficulty with sitting, lifting and standing. Travel and social life are more difficult and they may be disabled from work. Personal care, sexual activity and sleeping are not grossly affected, and the patient can usually be managed by conservative means  41-60% Severe Disability Pain remains the main problem in this group, but activities of daily living are affected. These patients require a detailed investigation  61-80% Crippled Back pain impinges on all aspects of the patient's life. Positive intervention is required  81-100% Bed-bound  These patients are either bed-bound or exaggerating their symptoms  Bluford FORBES [name], et al. Surgery versus conservative  management of stable thoracolumbar fracture: the PRESTO feasibility RCT. [place]): [employer]; 2021 Nov. [facility] Technology Assessment, No. 25.62.) Appendix 3, Oswestry Disability Index category descriptors. Available from: FindJewelers.cz  Minimally Clinically Important Difference (MCID) =  12.8%  COGNITION: Overall cognitive status: Within functional limits for tasks assessed     SENSATION: Patient reports numbness and tingling down left leg  MUSCLE LENGTH: Hamstrings: Tightness bilateral (left more impaired than right)  POSTURE: rounded shoulders and forward head  PALPATION: Tightness and muscle spasms along cervical paraspinals, upper trap, and lumbar paraspinals  LUMBAR ROM:   Decreased at least 50% with increased pain  12/11/23: Very limited today due to exacerbation of pain: about 75% limited  LOWER EXTREMITY ROM:     WFL  LOWER EXTREMITY MMT:    Eval: Left hip strength of 4/5 Right LE strength is WFL  LUMBAR SPECIAL TESTS:  Slump test: positive on the left side  FUNCTIONAL TESTS:  Eval: 5 times sit to stand: 13.37 sec Timed up and go (TUG): 9.34 sec  10/29/2023: 6 minute walk test:  1,137 ft with no increase of pain (age related norms 1,617 ft)  12/11/23 5 times sit to stand: 17.39 sec Timed up and go (TUG): 13.94 sec  GAIT: Distance walked: >500 ft Assistive device utilized: None Level of assistance: Complete Independence Comments: Patient states that she starts having increased pain with ambulation  PATIENT EDUCATION: Access Code: [email or web address] URL: [email or web address] Date: 11/26/2023 Prepared by: Delon Haddock  Exercises - Standing Hamstring Stretch on Chair  - 1 x daily - 7 x weekly - 1 sets - 3 reps - 30 sec hold - Quadricep Stretch with Chair and Counter Support  - 1 x daily - 7 x weekly - 1 sets - 3 reps - 30 sec hold - Seated Figure 4 Piriformis Stretch  - 1 x daily - 7 x weekly  - 1 sets - 3 reps - 30 sed hold - Supine 90/90 Alternating Heel Touches with Posterior Pelvic Tilt  - 1 x daily - 7 x weekly - 3 sets - 10 reps - Supine Dead Bug with Leg Extension  - 1 x daily - 7 x weekly - 3 sets - 10 reps   TREATMENT DATE:  12/11/2023: Nustep level 5 x 6 min with PT present to discuss status and setback  Lengthy discussion regarding importance of consistency with HEP Recert visit completed Moist heat x 20 min with IFC estim 8.2 intensity   12/03/2023: Nustep level 5 x 6 min with PT present to discuss status and foot  Examined Rt ankle/foot which is swollen with a small peach pit sized swollen place on the medial ankle. Not tender.  Recommended ice and compression stocking which pt has.  Hx of pain in the same ankle which pt attributed to OA Seated hamstring stretch 3 x 30 sec bilat (Rt tighter than the Left) Standing hip flexor/quad stretch 3 x 30 sec bil Seated piriformis stretch 2 x 30 sec bilat Prone on elbows x 2 min for centralization  Prone press up, modified to low range x 10 (closely monitoring for radicular symptoms) Manual Quad stretch 2x20 bil  PPT x 10 in supine (pt doesn't remember ever being supine but we tried it here due to exercises listed)  PPT with 90/90 heel tap x 10 with cueing to breathe and keep tilt throughout  Dying bug x 20 Declined ice   11/26/2023: Nustep level 5 x 6 min with PT present to discuss status, progress and set back Standing hamstring stretch 3 x 30 sec bilat Standing hip flexor/quad stretch 3 x 30 sec bil Seated piriformis stretch 2 x 30 sec bilat Prone on elbows x 2 min (closely monitoring for radicular  symptoms) Prone press up, modified to low range x 10 (closely monitoring for radicular symptoms) PPT x 10 PPT with 90/90 heel tap Dying bug x 20 Declined ice   ASSESSMENT:  CLINICAL IMPRESSION: Ms Matsunaga had a set back after a twisting motion trying to get away from a hornet.  Her objective findings were declined with  exception of her Oswestry self reported scale which was much improved.  She was unable to do much in the way of exercise today.  We completed her recertification and did moist heat and estim.  She would benefit from continuing skilled PT to address her back pain.  We discussed being more consistent with her HEP as the therapy will not likely be effective if only doing the stretches and exercises when she comes to PT.  She agrees and will work on setting more time aside for this.  She has f/u appt with referring provider before her next visit.  We will proceed based on his suggestions.    OBJECTIVE IMPAIRMENTS: decreased balance, decreased coordination, difficulty walking, decreased strength, impaired flexibility, postural dysfunction, and pain.   ACTIVITY LIMITATIONS: carrying, lifting, bending, sitting, standing, squatting, and sleeping  PARTICIPATION LIMITATIONS: meal prep, cleaning, laundry, community activity, and occupation  PERSONAL FACTORS: Time since onset of injury/illness/exacerbation and 1 comorbidity: OA are also affecting patient's functional outcome.   REHAB POTENTIAL: Good  CLINICAL DECISION MAKING: Stable/uncomplicated  EVALUATION COMPLEXITY: Low   GOALS: Goals reviewed with patient? Yes  SHORT TERM GOALS: Target date: 11/13/23  Patient will be independent with initial HEP. Baseline: Goal status: Goal met on 11/14/23  2.  Patient will participate in a 6 minute walk test to establish a baseline measurement. Baseline:  Goal status: Goal Met on 10/29/23   LONG TERM GOALS: Target date: 12/13/2023  Patient will be independent with advanced HEP to allow for self progression after discharge. Baseline:  Goal status: In Progress 12/11/23  2.  Patient will improve modified Oswestry to no greater than 55% to demonstrate improvements in functional tasks. Baseline: 70% Goal status: In progress 12/11/23  3.  Patient will increase left hip strength to [facility] to allow patient to  navigate a curb and stairs. Baseline:  Goal status: Ongoing  4.  Patient will report ability to work a shift of work pain no greater than 4/10. Baseline:  Goal status: In progress 12/11/23  5.  Patient will be able to demonstrate proper posture and body mechanics to be able to lift without increased pain. Baseline:  Goal status: Ongoing    PLAN:  PT FREQUENCY: 1-2x/week  PT DURATION: 8 weeks  PLANNED INTERVENTIONS: 97164- PT Re-evaluation, 97110-Therapeutic exercises, 97530- Therapeutic activity, [id number]- Neuromuscular re-education, 97535- Self Care, [id number]- Manual therapy, [id number]- Gait training, [phone]- Canalith repositioning, [id number]- Aquatic Therapy, [phone]- Electrical stimulation (unattended), [phone]- Electrical stimulation (manual), [id number]- Vasopneumatic device, [id number]- Ultrasound, [id number]- Traction (mechanical), [id number]- Ionotophoresis 4mg /ml Dexamethasone , 79439 (1-2 muscles), [id number] (3+ muscles)- Dry Needling, Patient/Family education, Balance training, Stair training, Taping, Joint mobilization, Joint manipulation, Spinal manipulation, Spinal mobilization, Vestibular training, Cryotherapy, and Moist heat.  PLAN FOR NEXT SESSION: Nustep, ask about MD suggestions at f/u, Core strengthening, flexibility, manual/dry needling as indicated    Dail Meece B. Maryl Blalock, PT 12/11/23 5:10 PM [facility] [address] [place], [place] [address] Phone # [phone] Fax [phone]

## 2023-12-17 NOTE — Progress Notes (Unsigned)
 Referring Physician:  Mercer Clotilda SAUNDERS, MD [address] [place],  [place] [address]  Primary Physician:  Mercer Clotilda SAUNDERS, MD  History of Present Illness: 12/18/2023 Overall she states that she has had a significant improvement in the left pain which originally was her presenting symptoms.  She was doing well and then unfortunately a large [place] hornet landed on her shoulder causing her to startle and move abruptly.  Ever since that time has had pain now in the contralateral leg on the right.  Duration: April 2025 Severity: 7/10  Precipitating: aggravated by prolonged standing Modifying factors: made better by nothing Weakness: none Timing: constant Bowel/Bladder Dysfunction: none  Conservative measures:  Physical therapy: currently participating in through [facility], helps for that day only    Multimodal medical therapy including regular antiinflammatories: prednisone , meloxicam , gabapentin , ibuprofen   Injections: no epidural steroid injections  Past Surgery: no spinal surgeries   Zineb Leslyn Monda has no symptoms of cervical myelopathy.  The symptoms are causing a significant impact on the patient's life.   Review of Systems:  A 10 point review of systems is negative, except for the pertinent positives and negatives detailed in the HPI.  Past Medical History: Past Medical History:  Diagnosis Date   Allergy    SEASONAL   Anemia    Anxiety    Arthritis    Depression    Gallstones 1998   GERD (gastroesophageal reflux disease)    Hyperlipidemia    Hypertension    Seizures (HCC)    AS A CHILD,BUT NOT AS A ADULT    Past Surgical History: Past Surgical History:  Procedure Laterality Date   CHOLECYSTECTOMY     CYSTOSCOPY Bilateral 02/25/2019   Procedure: CYSTOSCOPY;  Surgeon: Taam-Akelman, Rosalea K, MD;  Location: [facility];  Service: Gynecology;  Laterality: Bilateral;   ROBOTIC ASSISTED LAPAROSCOPIC HYSTERECTOMY AND  SALPINGECTOMY Bilateral 02/25/2019   Procedure: XI ROBOTIC ASSISTED LAPAROSCOPIC HYSTERECTOMY AND SALPINGECTOMY;  Surgeon: Bonnielee Rayleen POUR, MD;  Location: [facility];  Service: Gynecology;  Laterality: Bilateral;   TUBAL LIGATION     WISDOM TOOTH EXTRACTION      Allergies: Allergies as of 12/18/2023 - Review Complete 12/18/2023  Allergen Reaction Noted   Shellfish allergy Anaphylaxis and Swelling 08/06/2011   Lisinopril   12/16/2019    Medications: Outpatient Encounter Medications as of 12/18/2023  Medication Sig   aspirin EC 81 MG tablet Take 81 mg by mouth daily. Swallow whole.   gabapentin  (NEURONTIN ) 300 MG capsule Take 1 capsule (300 mg total) by mouth 3 (three) times daily.   hydrochlorothiazide  (HYDRODIURIL ) 12.5 MG tablet TAKE 1 TABLET BY MOUTH EVERY DAY   hydrocortisone  (ANUSOL -HC) 25 MG suppository Place 1 suppository (25 mg total) rectally 2 (two) times daily.   ibuprofen  (ADVIL ) 600 MG tablet Take 600 mg by mouth every 6 (six) hours as needed.   Iron, Ferrous Sulfate , 325 (65 Fe) MG TABS Take 325 mg by mouth daily.    meloxicam  (MOBIC ) 7.5 MG tablet TAKE 1 TABLET(7.5 MG) BY MOUTH DAILY   omeprazole  (PRILOSEC) 20 MG capsule TAKE 1 CAPSULE(20 MG) BY MOUTH DAILY   spironolactone  (ALDACTONE ) 25 MG tablet TAKE 1/2 TABLET BY MOUTH DAILY   tiZANidine  (ZANAFLEX ) 4 MG tablet Take 1 tablet (4 mg total) by mouth 3 (three) times daily.   VITAMIN D  PO Take by mouth. TAKE GUMMIES DAILY   No facility-administered encounter medications on file as of 12/18/2023.    Social History: Social History  Tobacco Use   Smoking status: Every Day    Current packs/day: 0.25    Types: Cigarettes   Smokeless tobacco: Never  Vaping Use   Vaping status: Never Used  Substance Use Topics   Alcohol use: Yes    Comment: occ   Drug use: No    Family Medical History: Family History  Problem Relation Age of Onset   Stomach cancer Mother    Arthritis Mother    Hearing  loss Mother    Hypertension Mother    Stomach cancer Sister    Hypertension Sister    Hypertension Sister    Hypertension Sister    Hypertension Sister    Hypertension Sister    Hypertension Brother    Hypertension Brother    Hypertension Brother    Hypertension Brother    Other Daughter        died in MVA   Colon cancer Neg Hx    Colon polyps Neg Hx    Esophageal cancer Neg Hx    Crohn's disease Neg Hx    Rectal cancer Neg Hx    Ulcerative colitis Neg Hx     Physical Examination:   NEUROLOGICAL:     Awake, alert, oriented to person, place, and time.  Speech is clear and fluent. Fund of knowledge is appropriate.   Cranial Nerves: Pupils equal round and reactive to light.  Facial tone is symmetric.   ROM of spine: Mild tenderness to palpation of lumbar paraspinals.  +SLR on the left   Strength:  Side Iliopsoas Quads Hamstring PF DF EHL  R 5 5 5 5 5 5   L 5 5 5 5 5 5    Reflexes are 2+ at the right patella, absent on the left patella.  1+ bilateral Achilles. Clonus is not present.  Toes are down-going.  Bilateral upper and lower extremity sensation is intact to light touch.    Gait is normal.    Medical Decision Making  Imaging: EXAM: MRI LUMBAR SPINE WITHOUT CONTRAST   TECHNIQUE: Multiplanar, multisequence MR imaging of the lumbar spine was performed. No intravenous contrast was administered.   COMPARISON:  Lumbar radiographs February 07, 2016.   FINDINGS: Segmentation:  Standard.   Alignment:  Physiologic.   Vertebrae:  No fracture, evidence of discitis, or bone lesion.   Conus medullaris and cauda equina: Conus extends to the L1-L2 level. Conus and cauda equina appear normal.   Paraspinal and other soft tissues: Unremarkable.   Disc levels:   T12-L1: No significant disc protrusion, foraminal stenosis, or canal stenosis.   L1-L2: No significant disc protrusion, foraminal stenosis, or canal stenosis.   L2-L3: No significant disc protrusion,  foraminal stenosis, or canal stenosis.   L3-L4: Broad disc bulge with bilateral facet arthropathy and inferiorly directed right subarticular disc protrusion. Resulting mild canal and mild bilateral subarticular recess stenosis. Patent foramina.   L4-L5: Left foraminal and extraforaminal disc protrusion closely approximates the exiting/exited left L4 nerve. Mild left foraminal stenosis. Patent canal and right foramen.   L5-S1: Disc bulging endplate spurring with bilateral facet arthropathy peers Ult Ng moderate left and mild right foraminal stenosis.   IMPRESSION: 1. At L5-S1, moderate left and mild right foraminal stenosis. 2. At L4-L5, mild left foraminal stenosis with left foraminal and extraforaminal disc protrusion closely approximates the exiting/exited left L4 nerve. 3. At L3-L4, mild canal and bilateral subarticular recess stenosis.    I have personally reviewed the images and agree with the above interpretation.  Assessment and Plan:  Ms. Mathwig is a pleasant 53 y.o. female is here today with a chief complaint of low back pain now radiating into the right lower extremity.  She states that the left sided gotten better significantly.  She is not having any more symptoms with it.  She was doing well and till unfortunately saw a large hornet land on her shoulder and startled abruptly and ever since that time now her right lower extremity hurts.  Causes pain going down her leg.  She is not having any new numbness weakness or tingling.  With all continues to be at her baseline.  She is currently planned to go for epidural spinal injections.  Some of her symptoms appear to be migratory, because of this I like to get a EMG and nerve conduction study of her bilateral lower extremities to evaluate for any active radiculopathy.  She does have multiple levels of lumbar spondylosis with left worse than right spondylitic disease and compression, however there is some right sided foraminal  stenosis noted as well/right-sided lateral recess stenosis.  Will plan to have her continue to follow-up with the pain team as scheduled for next week.  Since she is no longer symptomatic on the left could consider right-sided injections.  Will follow-up with her after injections.  Penne MICAEL Sharps, MD Dept. of Neurosurgery

## 2023-12-18 ENCOUNTER — Ambulatory Visit

## 2023-12-18 ENCOUNTER — Encounter: Payer: Self-pay | Admitting: Neurosurgery

## 2023-12-18 ENCOUNTER — Ambulatory Visit: Admitting: Neurosurgery

## 2023-12-18 VITALS — BP 104/62 | Ht 62.5 in | Wt 227.0 lb

## 2023-12-18 DIAGNOSIS — M4726 Other spondylosis with radiculopathy, lumbar region: Secondary | ICD-10-CM | POA: Diagnosis not present

## 2023-12-18 DIAGNOSIS — M5416 Radiculopathy, lumbar region: Secondary | ICD-10-CM

## 2023-12-18 DIAGNOSIS — M48061 Spinal stenosis, lumbar region without neurogenic claudication: Secondary | ICD-10-CM | POA: Diagnosis not present

## 2023-12-19 ENCOUNTER — Ambulatory Visit: Admitting: Rehabilitation

## 2023-12-19 ENCOUNTER — Encounter: Payer: Self-pay | Admitting: Rehabilitation

## 2023-12-19 DIAGNOSIS — M5459 Other low back pain: Secondary | ICD-10-CM

## 2023-12-19 DIAGNOSIS — M546 Pain in thoracic spine: Secondary | ICD-10-CM

## 2023-12-19 DIAGNOSIS — R293 Abnormal posture: Secondary | ICD-10-CM

## 2023-12-19 DIAGNOSIS — R252 Cramp and spasm: Secondary | ICD-10-CM

## 2023-12-19 DIAGNOSIS — R262 Difficulty in walking, not elsewhere classified: Secondary | ICD-10-CM

## 2023-12-19 DIAGNOSIS — R2689 Other abnormalities of gait and mobility: Secondary | ICD-10-CM

## 2023-12-19 DIAGNOSIS — M6281 Muscle weakness (generalized): Secondary | ICD-10-CM

## 2023-12-19 NOTE — Therapy (Addendum)
 OUTPATIENT PHYSICAL THERAPY TREATMENT NOTE   Patient Name: Erika Larson MRN: [id number] DOB:1971-01-01, 53 y.o., female Today's Date: 12/19/2023  END OF SESSION:  PT End of Session - 12/19/23 1654     Visit Number 9    Date for Recertification  02/05/24    Authorization Type [employer]    Authorization Time Period completed recert on 12/11/23    PT Start Time 1600    PT Stop Time 1643    PT Time Calculation (min) 43 min    Activity Tolerance Patient tolerated treatment well    Behavior During Therapy WFL for tasks assessed/performed            Past Medical History:  Diagnosis Date   Allergy    SEASONAL   Anemia    Anxiety    Arthritis    Depression    Gallstones 1998   GERD (gastroesophageal reflux disease)    Hyperlipidemia    Hypertension    Seizures (HCC)    AS A CHILD,BUT NOT AS A ADULT   Past Surgical History:  Procedure Laterality Date   CHOLECYSTECTOMY     CYSTOSCOPY Bilateral 02/25/2019   Procedure: CYSTOSCOPY;  Surgeon: Taam-Akelman, Rosalea K, MD;  Location: [facility];  Service: Gynecology;  Laterality: Bilateral;   ROBOTIC ASSISTED LAPAROSCOPIC HYSTERECTOMY AND SALPINGECTOMY Bilateral 02/25/2019   Procedure: XI ROBOTIC ASSISTED LAPAROSCOPIC HYSTERECTOMY AND SALPINGECTOMY;  Surgeon: Bonnielee Rayleen POUR, MD;  Location:  SURGERY CENTER;  Service: Gynecology;  Laterality: Bilateral;   TUBAL LIGATION     WISDOM TOOTH EXTRACTION     Patient Active Problem List   Diagnosis Date Noted   Status post hysterectomy 02/25/2019   Essential hypertension 04/27/2018   Arthritis 04/27/2018   Cigarette nicotine dependence without complication 04/27/2018   Obesity with body mass index 30 or greater 08/07/2016   Menorrhagia 08/03/2016   Smoker 08/03/2016    PCP: Mercer Clotilda SAUNDERS, MD  REFERRING PROVIDER: Mercer Clotilda SAUNDERS, MD  REFERRING DIAG: M54.16 (ICD-10-CM) - Lumbar radiculopathy, acute M48.061 (ICD-10-CM) - Lumbar  foraminal stenosis  Rationale for Evaluation and Treatment: Rehabilitation  THERAPY DIAG:  Other low back pain  Pain in thoracic spine  Muscle weakness (generalized)  Difficulty in walking, not elsewhere classified  Other abnormalities of gait and mobility  Abnormal posture  Cramp and spasm  ONSET DATE: May 2025  SUBJECTIVE:                                                                                                                                                                                           SUBJECTIVE STATEMENT: Patient reports her back feels  better than it did during her last PT appointment. Her BP was low at the doctor yesterday but reports she feels fine today. At the f/u appointment yesterday the doctor told her she could discontinue PT however, the patient would like to finish out her last scheduled appointment. The patient purchased a TENS unit for home which has been beneficial.   PERTINENT HISTORY:  OA, GERD  PAIN:  12/19/23 Are you having pain? Yes: NPRS scale: 7/10 Pain location: lumbar spine  Pain description: when it hits, it just hits.  Shocking radiating pain Aggravating factors: walking, lifting, standing Relieving factors: unknown  PRECAUTIONS: None  RED FLAGS: None   WEIGHT BEARING RESTRICTIONS: No  FALLS:  Has patient fallen in last 6 months? 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  NEXT MD VISIT: as needed pending progress with PT  OBJECTIVE:  Note: Objective measures were completed at Evaluation unless otherwise noted.  DIAGNOSTIC FINDINGS:  Lumbar MRI on 10/02/2023: IMPRESSION: 1. At L5-S1, moderate left and mild right foraminal stenosis. 2. At L4-L5,  mild left foraminal stenosis with left foraminal and extraforaminal disc protrusion closely approximates the exiting/exited left L4 nerve. 3. At L3-L4, mild canal and bilateral subarticular recess stenosis.  PATIENT SURVEYS:  Modified Oswestry:  MODIFIED OSWESTRY DISABILITY SCALE   Score Date: 10/21/23 12/11/23  Pain intensity 5 =  Pain medication has no effect on my pain. 3  2. Personal care (washing, dressing, etc.) 2 =  It is painful to take care of myself, and I am slow and careful. 1  3. Lifting 4 = I can lift only very light weights 1  4. Walking 3 =  Pain prevents me from walking more than  mile. 2  5. Sitting 2 =  Pain prevents me from sitting more than 1 hour. 3  6. Standing 4 =  Pain prevents me from standing more than 10 minutes. 1  7. Sleeping 5 =  Pain prevents me from sleeping at all. 2  8. Social Life 3 =  Pain prevents me from going out very often. 4  9. Traveling 3 = My pain restricts my travel over 1 hour 3  10. Employment/ Homemaking 4 = Pain prevents me from doing even light duties. 4  Total 35/50 = 70% 23/50 = 46%   Interpretation of scores: Score Category Description  0-20% Minimal Disability The patient can cope with most living activities. Usually no treatment is indicated apart from advice on lifting, sitting and exercise  21-40% Moderate Disability The patient experiences more pain and difficulty with sitting, lifting and standing. Travel and social life are more difficult and they may be disabled from work. Personal care, sexual activity and sleeping are not grossly affected, and the patient can usually be managed by conservative means  41-60% Severe Disability Pain remains the main problem in this group, but activities of daily living are affected. These patients require a detailed investigation  61-80% Crippled Back pain impinges on all aspects of the patient's life. Positive intervention is required  81-100% Bed-bound  These patients are either bed-bound or  exaggerating their symptoms  Bluford FORBES [name], et al. Surgery versus conservative management of stable thoracolumbar fracture: the PRESTO feasibility RCT. [place]): [employer]; 2021 Nov. (Health Technology Assessment, No. 25.62.) Appendix 3, Oswestry Disability Index category descriptors. Available from: Find[employer]el[employer]cz  Minimally Clinically Important Difference (MCID) = 12.8%  COGNITION: Overall cognitive status: Within functional limits for tasks assessed     SENSATION: Patient reports numbness and tingling down left leg  MUSCLE LENGTH: Hamstrings: Tightness bilateral (left more impaired than right)  POSTURE: rounded shoulders and forward head  PALPATION: Tightness and muscle spasms along cervical paraspinals, upper trap, and lumbar paraspinals  LUMBAR ROM:   Decreased at least 50% with increased pain  12/11/23: Very limited today due to exacerbation of pain: about 75% limited  LOWER EXTREMITY ROM:     WFL  LOWER EXTREMITY MMT:    Eval: Left hip strength of 4/5 Right LE strength is WFL  LUMBAR SPECIAL TESTS:  Slump test: positive on the left side  FUNCTIONAL TESTS:  Eval: 5 times sit to stand: 13.37 sec Timed up and go (TUG): 9.34 sec  10/29/2023: 6 minute walk test:  1,137 ft with no increase of pain (age related norms 1,617 ft)  12/11/23 5 times sit to stand: 17.39 sec Timed up and go (TUG): 13.94 sec  GAIT: Distance walked: >500 ft Assistive device utilized: None Level of assistance: Complete Independence Comments: Patient states that she starts having increased pain with ambulation  PATIENT EDUCATION: Access Code: [email or web address] URL: [email or web address] Date: 12/19/2023 Prepared by: Delon Pack  Exercises - Standing Hamstring Stretch on Chair  - 1 x daily - 7 x weekly - 1 sets - 3 reps - 30 sec hold - Quadricep Stretch with Chair and Counter Support  - 1 x daily - 7 x weekly -  1 sets - 3 reps - 30 sec hold - Seated Figure 4 Piriformis Stretch  - 1 x daily - 7 x weekly - 1 sets - 3 reps - 30 sed hold - Seated Marching with Opposite Shoulder Flexion  - 1 x daily - 7 x weekly - 3 sets - 10 reps - Supine Bridge  - 1 x daily - 7 x weekly - 3 sets - 10 reps - Supine 90/90 Alternating Heel Touches with Posterior Pelvic Tilt  - 1 x daily - 7 x weekly - 3 sets - 10 reps  TREATMENT DATE:  12/19/23 Vitals at beginning of visit:  BP: 124/83 mmHg HR: 108 bpm Nustep level 5 x 6 min with PT present to discuss status and follow-up visit with MD yesterday  Seated hamstring stretch 3 x 30 sec bilat (Rt tighter than the Left) Standing hip flexor/quad stretch 3 x 30 sec bil (leg on plinth while holding onto chair in front for balance)  Seated piriformis stretch 2 x 30 sec bil Prone on elbows x 2 min for centralization  Prone press up, modified to low range x 10 (closely monitoring for radicular symptoms) Manual Quad stretch 2x20 bil  PPT x 10 in supine (verbal cueing required for the patient to keep back on the table and tactile cueing required for patient to properly engage core) Bridges x 10  PPT with 90/90 heel tap x 10 with cueing to breathe and keep tilt throughout  Seated dying bug x 20  12/11/2023: Nustep level 5 x 6 min with PT present to discuss status and setback  Lengthy discussion regarding importance of consistency with HEP Recert visit completed Moist heat x 20 min with IFC estim 8.2 intensity   12/03/2023: Nustep level 5 x 6 min with PT present to discuss status and foot  Examined Rt ankle/foot which is swollen with a small peach pit sized swollen place on the medial ankle. Not tender.  Recommended ice and compression stocking which pt has.  Hx of pain in the same ankle which pt attributed to OA Seated hamstring stretch 3 x 30 sec bilat (Rt tighter than the Left) Standing hip flexor/quad stretch 3 x 30 sec bil Seated piriformis stretch 2 x 30 sec bilat Prone on  elbows x 2 min for centralization  Prone press up, modified to low range x 10 (closely monitoring for radicular symptoms) Manual Quad stretch 2x20 bil  PPT x 10 in supine (pt doesn't remember ever being supine but we tried it here due to exercises listed)  PPT with 90/90 heel tap x 10 with cueing to breathe and keep tilt throughout  Dying bug x 20 Declined ice   11/26/2023: Nustep level 5 x 6 min with PT present to discuss status, progress and set back Standing hamstring stretch 3 x 30 sec bilat Standing hip flexor/quad stretch 3 x 30 sec bil Seated piriformis stretch 2 x 30 sec bilat Prone on elbows x 2 min (closely monitoring for radicular symptoms) Prone press up, modified to low range x 10 (closely monitoring for radicular symptoms) PPT x 10 PPT with 90/90 heel tap Dying bug x 20 Declined ice   ASSESSMENT:  CLINICAL IMPRESSION: The patient tolerates the addition of bridging and seated alternating dying bug with no increases in pain or discomfort in the lumbar spine. The patient requires verbal and tactile cueing for proper execution of PPT without compensating by raising her back off of the plinth. The patient's HEP is updated to include the additional exercises incorporated during today's visit. She would benefit from continuing skilled PT to address her back pain. Plan for discharge at the next visit.   OBJECTIVE IMPAIRMENTS: decreased balance, decreased coordination, difficulty walking, decreased strength, impaired flexibility, postural dysfunction, and pain.   ACTIVITY LIMITATIONS: carrying, lifting, bending, sitting, standing, squatting, and sleeping  PARTICIPATION LIMITATIONS: meal prep, cleaning, laundry, community activity, and occupation  PERSONAL FACTORS: Time since onset of injury/illness/exacerbation and 1 comorbidity: OA are also affecting patient's functional outcome.   REHAB POTENTIAL: Good  CLINICAL DECISION MAKING: Stable/uncomplicated  EVALUATION COMPLEXITY:  Low   GOALS: Goals reviewed with patient? Yes  SHORT TERM GOALS: Target date: 11/13/23  Patient will be independent with initial HEP. Baseline: Goal status: Goal met on 11/14/23  2.  Patient will participate in a 6 minute walk test to establish a baseline measurement. Baseline:  Goal status: Goal Met on 10/29/23   LONG TERM GOALS: Target date: 12/13/2023  Patient will be independent with advanced HEP to allow for self progression after discharge. Baseline:  Goal status: In Progress 12/11/23  2.  Patient will improve modified Oswestry to no greater than 55% to demonstrate improvements in functional tasks. Baseline: 70% Goal status: In progress 12/11/23  3.  Patient will increase left hip strength to [facility] to allow patient to navigate a curb and stairs. Baseline:  Goal status: Ongoing  4.  Patient will report ability to work a shift of work pain no greater than 4/10. Baseline:  Goal status: In progress 12/11/23  5.  Patient will be able to demonstrate proper posture and body mechanics to be able to lift without increased pain. Baseline:  Goal status: Ongoing    PLAN:  PT FREQUENCY: 1-2x/week  PT DURATION: 8 weeks  PLANNED INTERVENTIONS: 97164- PT Re-evaluation, 97110-Therapeutic exercises, 97530- Therapeutic activity, [id number]- Neuromuscular  re-education, [phone]- Self Care, 02859- Manual therapy, [id number]- Gait training, [phone]- Canalith repositioning, [id number]- Aquatic Therapy, [id number]- Electrical stimulation (unattended), [phone]- Electrical stimulation (manual), [id number]- Vasopneumatic device, [id number]- Ultrasound, [id number]- Traction (mechanical), [id number]- Ionotophoresis 4mg /ml Dexamethasone , 79439 (1-2 muscles), [id number] (3+ muscles)- Dry Needling, Patient/Family education, Balance training, Stair training, Taping, Joint mobilization, Joint manipulation, Spinal manipulation, Spinal mobilization, Vestibular training, Cryotherapy, and Moist heat.  PLAN FOR NEXT SESSION: Nustep, core strengthening,  flexibility, manual/dry needling as indicated, plan to D/C   Randall Pack, SPT 12/19/23 4:58 PM [facility] [address] [place], [place] [address] Phone # [phone] Fax [phone]  I agree with the following treatment note after reviewing documentation. This session was performed under the supervision of a licensed clinician.

## 2023-12-24 ENCOUNTER — Encounter: Payer: Self-pay | Admitting: Student in an Organized Health Care Education/Training Program

## 2023-12-24 ENCOUNTER — Ambulatory Visit
Attending: Student in an Organized Health Care Education/Training Program | Admitting: Student in an Organized Health Care Education/Training Program

## 2023-12-24 VITALS — BP 137/90 | HR 101 | Temp 98.3°F | Resp 16 | Ht 64.0 in | Wt 227.6 lb

## 2023-12-24 DIAGNOSIS — G8929 Other chronic pain: Secondary | ICD-10-CM | POA: Insufficient documentation

## 2023-12-24 DIAGNOSIS — M48061 Spinal stenosis, lumbar region without neurogenic claudication: Secondary | ICD-10-CM | POA: Diagnosis present

## 2023-12-24 DIAGNOSIS — M5416 Radiculopathy, lumbar region: Secondary | ICD-10-CM | POA: Insufficient documentation

## 2023-12-24 MED ORDER — CELECOXIB 100 MG PO CAPS: 100.0000 mg | ORAL_CAPSULE | Freq: Two times a day (BID) | ORAL | 0 refills | Status: DC

## 2023-12-24 NOTE — Patient Instructions (Signed)
 Moderate Conscious Sedation, Adult Sedation is the use of medicines to help you relax and not feel pain. Moderate conscious sedation is a type of sedation that makes you less alert than normal. You are still able to respond to instructions, touch, or both. This type of sedation is used during short medical and dental procedures. It is milder than deep sedation, which is a type of sedation you cannot be easily woken up from. It is also milder than general anesthesia, which is the use of medicines to make you fall asleep. Moderate conscious sedation lets you return to your normal activities sooner. Tell a health care provider about: Any allergies you have. All medicines you are taking, including vitamins, herbs, steroids, eye drops, creams, and over-the-counter medicines. Any problems you or family members have had with anesthesia. Any bleeding problems you have. Any surgeries you have had. Any medical conditions you have. Whether you are pregnant or may be pregnant. Any recent alcohol, tobacco, or drug use. What are the risks? Your health care provider will talk with you about risks. These may include: Oversedation. This is when you get too much medicine. Nausea or vomiting. Allergic reaction to medicines. Trouble breathing. If this happens, a breathing tube may be used. It will be removed when you can breathe better on your own. Heart trouble. Lung trouble. Emergence delirium. This is when you feel confused while the sedation wears off. This gets better with time. What happens before the procedure? When to stop eating and drinking Follow instructions from your health care provider about what you may eat and drink. These may include: 8 hours before your procedure Stop eating most foods. Do not eat meat, fried foods, or fatty foods. Eat only light foods, such as toast or crackers. All liquids are okay except energy drinks and alcohol. 6 hours before your procedure Stop eating. Drink only  clear liquids, such as water, clear fruit juice, black coffee, plain tea, and sports drinks. Do not drink energy drinks or alcohol. 2 hours before your procedure Stop drinking all liquids. You may be allowed to take medicines with small sips of water. If you do not follow your health care provider's instructions, your procedure may be delayed or canceled. Medicines Ask your health care provider about: Changing or stopping your regular medicines. These include any diabetes medicines or blood thinners you take. Taking medicines such as aspirin and ibuprofen. These medicines can thin your blood. Do not take them unless your health care provider tells you to. Taking over-the-counter medicines, vitamins, herbs, and supplements. Tests and exams You may have an exam or testing. You may have a blood or urine sample taken. General instructions Do not use any products that contain nicotine or tobacco for at least 4 weeks before the procedure. These products include cigarettes, chewing tobacco, and vaping devices, such as e-cigarettes. If you need help quitting, ask your health care provider. If you will be going home right after the procedure, plan to have a responsible adult: Take you home from the hospital or clinic. You will not be allowed to drive. Care for you for the time you are told. What happens during the procedure?  You will be given the sedative. It may be given: As a pill you can take by mouth. It can also be put into the rectum. As a spray through the nose. As an injection into muscle. As an injection into a vein through an IV. You may be given oxygen as needed. Your blood pressure, heart  rate, breathing rate, and blood oxygen level will be monitored during the procedure. The medical or dental procedure will be done. The procedure may vary among health care providers and hospitals. What happens after the procedure? Your blood pressure, heart rate, breathing rate, and blood oxygen  level will be monitored until you leave the hospital or clinic. You will get fluids through an IV as needed. Do not drive or operate machinery until your health care provider says that it is safe. This information is not intended to replace advice given to you by your health care provider. Make sure you discuss any questions you have with your health care provider. Document Revised: 10/02/2021 Document Reviewed: 10/02/2021 Else[employer] Patient Education  2024 [employer]GENERAL RISKS AND COMPLICATIONS  What are the risk, side effects and possible complications? Generally speaking, most procedures are safe.  However, with any procedure there are risks, side effects, and the possibility of complications.  The risks and complications are dependent upon the sites that are lesioned, or the type of nerve block to be performed.  The closer the procedure is to the spine, the more serious the risks are.  Great care is taken when placing the radio frequency needles, block needles or lesioning probes, but sometimes complications can occur. Infection: Any time there is an injection through the skin, there is a risk of infection.  This is why sterile conditions are used for these blocks.  There are four possible types of infection. Localized skin infection. Central Nervous System Infection-This can be in the form of Meningitis, which can be deadly. Epidural Infections-This can be in the form of an epidural abscess, which can cause pressure inside of the spine, causing compression of the spinal cord with subsequent paralysis. This would require an emergency surgery to decompress, and there are no guarantees that the patient would recover from the paralysis. Discitis-This is an infection of the intervertebral discs.  It occurs in about 1% of discography procedures.  It is difficult to treat and it may lead to surgery.        2. Pain: the needles have to go through skin and soft tissues, will cause soreness.        3. Damage to internal structures:  The nerves to be lesioned may be near blood vessels or    other nerves which can be potentially damaged.       4. Bleeding: Bleeding is more common if the patient is taking blood thinners such as  aspirin, Coumadin, Ticiid, Plavix, etc., or if he/she have some genetic predisposition  such as hemophilia. Bleeding into the spinal canal can cause compression of the spinal  cord with subsequent paralysis.  This would require an emergency surgery to  decompress and there are no guarantees that the patient would recover from the  paralysis.       5. Pneumothorax:  Puncturing of a lung is a possibility, every time a needle is introduced in  the area of the chest or upper back.  Pneumothorax refers to free air around the  collapsed lung(s), inside of the thoracic cavity (chest cavity).  Another two possible  complications related to a similar event would include: Hemothorax and Chylothorax.   These are variations of the Pneumothorax, where instead of air around the collapsed  lung(s), you may have blood or chyle, respectively.       6. Spinal headaches: They may occur with any procedures in the area of the spine.       7.  Persistent CSF (Cerebro-Spinal Fluid) leakage: This is a rare problem, but may occur  with prolonged intrathecal or epidural catheters either due to the formation of a fistulous  track or a dural tear.       8. Nerve damage: By working so close to the spinal cord, there is always a possibility of  nerve damage, which could be as serious as a permanent spinal cord injury with  paralysis.       9. Death:  Although rare, severe deadly allergic reactions known as "Anaphylactic  reaction" can occur to any of the medications used.      10. Worsening of the symptoms:  We can always make thing worse.  What are the chances of something like this happening? Chances of any of this occuring are extremely low.  By statistics, you have more of a chance of getting killed in a  motor vehicle accident: while driving to the hospital than any of the above occurring .  Nevertheless, you should be aware that they are possibilities.  In general, it is similar to taking a shower.  Everybody knows that you can slip, hit your head and get killed.  Does that mean that you should not shower again?  Nevertheless always keep in mind that statistics do not mean anything if you happen to be on the wrong side of them.  Even if a procedure has a 1 (one) in a 1,000,000 (million) chance of going wrong, it you happen to be that one..Also, keep in mind that by statistics, you have more of a chance of having something go wrong when taking medications.  Who should not have this procedure? If you are on a blood thinning medication (e.g. Coumadin, Plavix, see list of "Blood Thinners"), or if you have an active infection going on, you should not have the procedure.  If you are taking any blood thinners, please inform your physician.  How should I prepare for this procedure? Do not eat or drink anything at least six hours prior to the procedure. Bring a driver with you .  It cannot be a taxi. Come accompanied by an adult that can drive you back, and that is strong enough to help you if your legs get weak or numb from the local anesthetic. Take all of your medicines the morning of the procedure with just enough water to swallow them. If you have diabetes, make sure that you are scheduled to have your procedure done first thing in the morning, whenever possible. If you have diabetes, take only half of your insulin dose and notify our nurse that you have done so as soon as you arrive at the clinic. If you are diabetic, but only take blood sugar pills (oral hypoglycemic), then do not take them on the morning of your procedure.  You may take them after you have had the procedure. Do not take aspirin or any aspirin-containing medications, at least eleven (11) days prior to the procedure.  They may prolong  bleeding. Wear loose fitting clothing that may be easy to take off and that you would not mind if it got stained with Betadine or blood. Do not wear any jewelry or perfume Remove any nail coloring.  It will interfere with some of our monitoring equipment.  NOTE: Remember that this is not meant to be interpreted as a complete list of all possible complications.  Unforeseen problems may occur.  BLOOD THINNERS The following drugs contain aspirin or other products, which can cause increased  bleeding during surgery and should not be taken for 2 weeks prior to and 1 week after surgery.  If you should need take something for relief of minor pain, you may take acetaminophen which is found in Tylenol,m Datril, Anacin-3 and Panadol. It is not blood thinner. The products listed below are.  Do not take any of the products listed below in addition to any listed on your instruction sheet.  A.P.C or A.P.C with Codeine Codeine Phosphate Capsules #3 Ibuprofen Ridaura  ABC compound Congesprin Imuran rimadil  Advil Cope Indocin Robaxisal  Alka-Seltzer Effervescent Pain Reliever and Antacid Coricidin or Coricidin-D  Indomethacin Rufen  Alka-Seltzer plus Cold Medicine Cosprin Ketoprofen S-A-C Tablets  Anacin Analgesic Tablets or Capsules Coumadin Korlgesic Salflex  Anacin Extra Strength Analgesic tablets or capsules CP-2 Tablets Lanoril Salicylate  Anaprox Cuprimine Capsules Levenox Salocol  Anexsia-D Dalteparin Magan Salsalate  Anodynos Darvon compound Magnesium Salicylate Sine-off  Ansaid Dasin Capsules Magsal Sodium Salicylate  Anturane Depen Capsules Marnal Soma  APF Arthritis pain formula Dewitt's Pills Measurin Stanback  Argesic Dia-Gesic Meclofenamic Sulfinpyrazone  Arthritis Bayer Timed Release Aspirin Diclofenac Meclomen Sulindac  Arthritis pain formula Anacin Dicumarol Medipren Supac  Analgesic (Safety coated) Arthralgen Diffunasal Mefanamic Suprofen  Arthritis Strength Bufferin Dihydrocodeine Mepro  Compound Suprol  Arthropan liquid Dopirydamole Methcarbomol with Aspirin Synalgos  ASA tablets/Enseals Disalcid Micrainin Tagament  Ascriptin Doan's Midol Talwin  Ascriptin A/D Dolene Mobidin Tanderil  Ascriptin Extra Strength Dolobid Moblgesic Ticlid  Ascriptin with Codeine Doloprin or Doloprin with Codeine Momentum Tolectin  Asperbuf Duoprin Mono-gesic Trendar  Aspergum Duradyne Motrin or Motrin IB Triminicin  Aspirin plain, buffered or enteric coated Durasal Myochrisine Trigesic  Aspirin Suppositories Easprin Nalfon Trillsate  Aspirin with Codeine Ecotrin Regular or Extra Strength Naprosyn Uracel  Atromid-S Efficin Naproxen Ursinus  Auranofin Capsules Elmiron Neocylate Vanquish  Axotal Emagrin Norgesic Verin  Azathioprine Empirin or Empirin with Codeine Normiflo Vitamin E  Azolid Emprazil Nuprin Voltaren  Bayer Aspirin plain, buffered or children's or timed BC Tablets or powders Encaprin Orgaran Warfarin Sodium  Buff-a-Comp Enoxaparin Orudis Zorpin  Buff-a-Comp with Codeine Equegesic Os-Cal-Gesic   Buffaprin Excedrin plain, buffered or Extra Strength Oxalid   Bufferin Arthritis Strength Feldene Oxphenbutazone   Bufferin plain or Extra Strength Feldene Capsules Oxycodone with Aspirin   Bufferin with Codeine Fenoprofen Fenoprofen Pabalate or Pabalate-SF   Buffets II Flogesic Panagesic   Buffinol plain or Extra Strength Florinal or Florinal with Codeine Panwarfarin   Buf-Tabs Flurbiprofen Penicillamine   Butalbital Compound Four-way cold tablets Penicillin   Butazolidin Fragmin Pepto-Bismol   Carbenicillin Geminisyn Percodan   Carna Arthritis Reliever Geopen Persantine   Carprofen Gold's salt Persistin   Chloramphenicol Goody's Phenylbutazone   Chloromycetin Haltrain Piroxlcam   Clmetidine heparin Plaquenil   Cllnoril Hyco-pap Ponstel   Clofibrate Hydroxy chloroquine Propoxyphen         Before stopping any of these medications, be sure to consult the physician who ordered them.   Some, such as Coumadin (Warfarin) are ordered to prevent or treat serious conditions such as "deep thrombosis", "pumonary embolisms", and other heart problems.  The amount of time that you may need off of the medication may also vary with the medication and the reason for which you were taking it.  If you are taking any of these medications, please make sure you notify your pain physician before you undergo any procedures.         Epidural Steroid Injection Patient Information  Description: The epidural space surrounds the nerves as they  exit the spinal cord.  In some patients, the nerves can be compressed and inflamed by a bulging disc or a tight spinal canal (spinal stenosis).  By injecting steroids into the epidural space, we can bring irritated nerves into direct contact with a potentially helpful medication.  These steroids act directly on the irritated nerves and can reduce swelling and inflammation which often leads to decreased pain.  Epidural steroids may be injected anywhere along the spine and from the neck to the low back depending upon the location of your pain.   After numbing the skin with local anesthetic (like Novocaine), a small needle is passed into the epidural space slowly.  You may experience a sensation of pressure while this is being done.  The entire block usually last less than 10 minutes.  Conditions which may be treated by epidural steroids:  Low back and leg pain Neck and arm pain Spinal stenosis Post-laminectomy syndrome Herpes zoster (shingles) pain Pain from compression fractures  Preparation for the injection:  Do not eat any solid food or dairy products within 8 hours of your appointment.  You may drink clear liquids up to 3 hours before appointment.  Clear liquids include water, black coffee, juice or soda.  No milk or cream please. You may take your regular medication, including pain medications, with a sip of water before your appointment  Diabetics  should hold regular insulin (if taken separately) and take 1/2 normal NPH dos the morning of the procedure.  Carry some sugar containing items with you to your appointment. A driver must accompany you and be prepared to drive you home after your procedure.  Bring all your current medications with your. An IV may be inserted and sedation may be given at the discretion of the physician.   A blood pressure cuff, EKG and other monitors will often be applied during the procedure.  Some patients may need to have extra oxygen administered for a short period. You will be asked to provide medical information, including your allergies, prior to the procedure.  We must know immediately if you are taking blood thinners (like Coumadin/Warfarin)  Or if you are allergic to IV iodine contrast (dye). We must know if you could possible be pregnant.  Possible side-effects: Bleeding from needle site Infection (rare, may require surgery) Nerve injury (rare) Numbness & tingling (temporary) Difficulty urinating (rare, temporary) Spinal headache ( a headache worse with upright posture) Light -headedness (temporary) Pain at injection site (several days) Decreased blood pressure (temporary) Weakness in arm/leg (temporary) Pressure sensation in back/neck (temporary)  Call if you experience: Fever/chills associated with headache or increased back/neck pain. Headache worsened by an upright position. New onset weakness or numbness of an extremity below the injection site Hives or difficulty breathing (go to the emergency room) Inflammation or drainage at the infection site Severe back/neck pain Any new symptoms which are concerning to you  Please note:  Although the local anesthetic injected can often make your back or neck feel good for several hours after the injection, the pain will likely return.  It takes 3-7 days for steroids to work in the epidural space.  You may not notice any pain relief for at least that  one week.  If effective, we will often do a series of three injections spaced 3-6 weeks apart to maximally decrease your pain.  After the initial series, we generally will wait several months before considering a repeat injection of the same type.  If you have any questions,  please call [phone] [facility]

## 2023-12-24 NOTE — Progress Notes (Signed)
 PROVIDER NOTE: Interpretation of information contained herein should be left to medically-trained personnel. Specific patient instructions are provided elsewhere under Patient Instructions section of medical record. This document was created in part using AI and STT-dictation technology, any transcriptional errors that may result from this process are unintentional.  Patient: Erika Larson  Service: E/M Encounter  Provider: Wallie Sherry, MD  DOB: [birth year]  Delivery: Face-to-face  Specialty: Interventional Pain Management  MRN: [id number]  Setting: Ambulatory outpatient facility  Specialty designation: 09  Type: New Patient  Location: Outpatient office facility  PCP: Mercer Clotilda SAUNDERS, MD  DOS: 12/24/2023    Referring Prov.: Erika Bottcher, PA-C   Primary Reason(s) for Visit: Encounter for initial evaluation of one or more chronic problems (new to examiner) potentially causing chronic pain, and posing a threat to normal musculoskeletal function. (Level of risk: High) CC: Back Pain  HPI  [name] is a 53 y.o. year old, female patient, who comes for the first time to our practice referred by Erika Bottcher, PA-C for our initial evaluation of her chronic pain. She has Essential hypertension; Arthritis; Cigarette nicotine dependence without complication; Status post hysterectomy; Obesity with body mass index 30 or greater; Menorrhagia; Smoker; Lumbar radiculopathy; and Foraminal stenosis of lumbar region on their problem list. Today she comes in for evaluation of her Back Pain  Pain Assessment: Location: Lower Back Radiating: through buttocks and down legs bilat to calf area Onset: More than a month ago Duration: Chronic pain Quality: Aching, Burning, Constant, Shooting, Sharp, Pressure, Throbbing, Stabbing, Tingling Severity: 6 /10 (subjective, self-reported pain score)  Effect on ADL: limits adls Timing: Constant Modifying factors: massage, recliner, hot shower BP: (!) 137/90   HR: (!) 101  Onset and Duration: Present longer than 3 months Cause of pain: Unknown Severity: No change since onset, NAS-11 at its worse: 10/10, NAS-11 at its best: 3/10, NAS-11 now: 8/10, and NAS-11 on the average: 8/10 Timing: Not influenced by the time of the day Aggravating Factors: Kneeling, Lifiting, Prolonged sitting, and Prolonged standing Alleviating Factors: Cold packs, Hot packs, and Warm showers or baths Associated Problems: Numbness, Sweating, Swelling, Temperature changes, Tingling, Pain that wakes patient up, and Pain that does not allow patient to sleep Quality of Pain: Aching, Agonizing, Burning, Constant, Dreadful, Feeling of constriction, Getting longer, Nagging, Pressure-like, Pulsating, Sharp, Shooting, Splitting, Stabbing, Superficial, Tender, Throbbing, Tingling, Tiring, Toothache-like, and Uncomfortable Previous Examinations or Tests: MRI scan and X-rays Previous Treatments: Strengthening exercises  Ms. Summons is being evaluated for possible interventional pain management therapies for the treatment of her chronic pain.   Discussed the use of AI scribe software for clinical note transcription with the patient, who gave verbal consent to proceed.  History of Present Illness   Erika Larson is a 54 year old female with disc herniation at L4-L5 who presents with back pain radiating to both legs. She was referred by neurosurgeons for consideration of left L4 TF Si.  She has been experiencing back pain radiating into both legs, initially starting in her left leg and then moving up to her back. The pain began around late May to early June. No trauma or accident is reported as a cause, and she attributes it to years of working on concrete floors as a [employer], which required prolonged standing.  MRI showed a disc herniation at L4-L5 affecting the left L4 nerve.  She describes the pain as a burning sensation that runs down her legs, stopping at the ankle,  without reaching the  bottom of her foot. The pain is more pronounced on the left side, which was the initial site of discomfort.  She has been on gabapentin  for approximately two months, starting at 100 mg and increased to 300 mg three times a day, but reports no relief from the medication. She was also on meloxicam , initially prescribed for shoulder pain, but it has not been effective for her current symptoms. She has not tried Celebrex  before.  She has taken time off work this year due to her condition, indicating a significant impact on her daily life and ability to perform her job.   She is working with physical therapy      Meds   Current Outpatient Medications:    aspirin EC 81 MG tablet, Take 81 mg by mouth daily. Swallow whole., Disp: , Rfl:    celecoxib  (CELEBREX ) 100 MG capsule, Take 1 capsule (100 mg total) by mouth 2 (two) times daily., Disp: 60 capsule, Rfl: 0   gabapentin  (NEURONTIN ) 300 MG capsule, Take 1 capsule (300 mg total) by mouth 3 (three) times daily., Disp: 90 capsule, Rfl: 2   hydrochlorothiazide  (HYDRODIURIL ) 12.5 MG tablet, TAKE 1 TABLET BY MOUTH EVERY DAY, Disp: 90 tablet, Rfl: 2   Iron, Ferrous Sulfate , 325 (65 Fe) MG TABS, Take 325 mg by mouth daily. , Disp: , Rfl:    omeprazole  (PRILOSEC) 20 MG capsule, TAKE 1 CAPSULE(20 MG) BY MOUTH DAILY, Disp: 30 capsule, Rfl: 1   spironolactone  (ALDACTONE ) 25 MG tablet, TAKE 1/2 TABLET BY MOUTH DAILY, Disp: 45 tablet, Rfl: 1   tiZANidine  (ZANAFLEX ) 4 MG tablet, Take 1 tablet (4 mg total) by mouth 3 (three) times daily., Disp: 90 tablet, Rfl: 1   VITAMIN D  PO, Take by mouth. TAKE GUMMIES DAILY, Disp: , Rfl:   Imaging Review    Narrative CLINICAL DATA:  Right arm and shoulder numbness starting last Thursday. History of arthritis in the cervical spine. No recent injury.  EXAM: CT CERVICAL SPINE WITHOUT CONTRAST  TECHNIQUE: Multidetector CT imaging of the cervical spine was performed without intravenous contrast.  Multiplanar CT image reconstructions were also generated.  COMPARISON:  Cervical spine radiographs 12/19/2012.  FINDINGS: Straightening of the usual cervical lordosis which may be due to patient positioning or degenerative change but ligamentous injury or muscle spasm can also have this appearance and are not excluded. No anterior subluxation of cervical vertebrae. Degenerative changes in the cervical spine with narrowed interspaces and associated endplate hypertrophic changes throughout. Degenerative changes are most prominent at C4-5, C5-6, and C6-7 levels. At C4-5 level, there is a prominent disc osteophyte complex which likely causes some encroachment upon the central canal. Compression deformities of the lateral masses of C1 without significant disruption of the C1 ring. This is likely chronic. No acute cortical irregularities are demonstrated. Associated superior subluxation of C2 with respect to the foramen magnum. This likely represents old arthritic change. Mild prevertebral soft tissue swelling at the level of C1. No focal bone lesion or acute cortical irregularities demonstrated. No expansile or destructive bone lesions. Bone cortex and trabecular architecture appear intact. Soft tissues are otherwise unremarkable.  IMPRESSION: Chronic appearing changes suggested at C1 to with mild basilar invagination. No significant encroachment upon the foramen magnum. Hypertrophic degenerative changes throughout the cervical spine with prominent disc osteophyte complex noted at C4-5 level. No acute fracture or dislocation is demonstrated. Nonspecific straightening of the usual cervical lordosis.   Electronically Signed By: Elsie Gravely M.D. On: 07/07/2014 23:57   DG Cervical Spine  Complete  Narrative CLINICAL DATA:  Right shoulder pain  EXAM: CERVICAL SPINE  4+ VIEWS  COMPARISON:  None.  FINDINGS: Normal alignment of the cervical spine. The vertebral body  heights are well preserved. Mild disc space narrowing and ventral endplate spurring is noted at C4-5 and C5-6. There is no fracture or subluxation identified. No radio-opaque foreign body or soft tissue calcifications.  IMPRESSION: 1. No acute findings.  2. Cervical spondylosis noted.   Electronically Signed By: Waddell Calk M.D. On: 12/19/2012 16:28   DG Shoulder Left  Narrative CLINICAL DATA:  Left shoulder and AC joint pain  EXAM: LEFT SHOULDER - 2+ VIEW  COMPARISON:  None.  FINDINGS: Normal alignment without acute osseous finding, fracture, subluxation or dislocation. Slight irregularity and sclerosis of the glenoid noted compatible with mild degenerative arthropathy. AC joint aligned.  IMPRESSION: Mild degenerative changes as above. No acute osseous finding by plain radiography   Electronically Signed By: CHRISTELLA.  Shick M.D. On: 06/11/2020 15:35     Narrative CLINICAL DATA:  Myelopathy, chronic, lumbar spine, worsening LLE radiculopathy, anterior lateral thigh pain waking pt up at night  EXAM: MRI LUMBAR SPINE WITHOUT CONTRAST  TECHNIQUE: Multiplanar, multisequence MR imaging of the lumbar spine was performed. No intravenous contrast was administered.  COMPARISON:  Lumbar radiographs February 07, 2016.  FINDINGS: Segmentation:  Standard.  Alignment:  Physiologic.  Vertebrae:  No fracture, evidence of discitis, or bone lesion.  Conus medullaris and cauda equina: Conus extends to the L1-L2 level. Conus and cauda equina appear normal.  Paraspinal and other soft tissues: Unremarkable.  Disc levels:  T12-L1: No significant disc protrusion, foraminal stenosis, or canal stenosis.  L1-L2: No significant disc protrusion, foraminal stenosis, or canal stenosis.  L2-L3: No significant disc protrusion, foraminal stenosis, or canal stenosis.  L3-L4: Broad disc bulge with bilateral facet arthropathy and inferiorly directed right subarticular disc  protrusion. Resulting mild canal and mild bilateral subarticular recess stenosis. Patent foramina.  L4-L5: Left foraminal and extraforaminal disc protrusion closely approximates the exiting/exited left L4 nerve. Mild left foraminal stenosis. Patent canal and right foramen.  L5-S1: Disc bulging endplate spurring with bilateral facet arthropathy peers Ult Ng moderate left and mild right foraminal stenosis.  IMPRESSION: 1. At L5-S1, moderate left and mild right foraminal stenosis. 2. At L4-L5, mild left foraminal stenosis with left foraminal and extraforaminal disc protrusion closely approximates the exiting/exited left L4 nerve. 3. At L3-L4, mild canal and bilateral subarticular recess stenosis.   Electronically Signed By: Gilmore GORMAN Molt M.D. On: 10/02/2023 19:52  DG Lumbar Spine Complete  Narrative EXAM: 4 VIEW(S) XRAY OF THE LUMBAR SPINE 11/18/2023 03:42:45 PM  COMPARISON: None available.  CLINICAL HISTORY: Back pain. Arthritis. Patient states no injury, pain left lower back since April.  FINDINGS:  BONES: No acute fracture. No aggressive appearing osseous lesion. Alignment is normal. No abnormal motion on flexion or extension.  DISCS AND DEGENERATIVE CHANGES: Moderate and progressive disc space narrowing at L3-L4, L4-L5, and L5-S1 since 2021. Lower lumbar facet hypertrophy.  SOFT TISSUES: No acute abnormality.  IMPRESSION: 1.  Progressive degenerative disc disease at L3-L4, L4-L5, L5-S1. 2.  Lower lumbar facet hypertrophy.  Electronically signed by: Andrea Gasman MD 11/29/2023 10:21 PM EDT RP Workstation: [id number]  Narrative Please see detailed radiograph report in office note.  Ankle-L DG Complete: Results for orders placed in visit on 08/22/02  DG Ankle Complete Left  Narrative FINDINGS CLINICAL DATA:  MEDIAL LEFT FOOT AND ANKLE PAIN FOLLOWING AN INJURY. COMPLETE LEFT FOOT 08/22/02 THREE  VIEWS DEMONSTRATE MODERATE INFERIOR AND MILD TO  MODERATE POSTERIOR CALCANEAL SPUR FORMATION. NO FRACTURE OR DISLOCATION IS SEEN. IMPRESSION NO FRACTURE. LEFT ANKLE 08/22/02 CALCANEAL SPUR FORMATION IS AGAIN NOTED WITH NO FRACTURE, DISLOCATION OR EFFUSION SEEN. IMPRESSION NO FRACTURE.   Foot Imaging: Foot-R DG Complete: Results for orders placed in visit on 06/11/19  DG Foot Complete Right  Narrative CLINICAL DATA:  Right foot pain for 2 weeks. Tenderness to palpation of lateral midfoot and edema.  EXAM: RIGHT FOOT COMPLETE - 3+ VIEW  COMPARISON:  None  FINDINGS: Signs of midfoot degenerative change and plantar enthesopathy. No signs of acute fracture or dislocation. No significant soft tissue swelling.  IMPRESSION: No acute findings. Signs of midfoot degenerative change and plantar enthesopathy.   Electronically Signed By: Isla Blind M.D. On: 06/12/2019 08:57  Foot-L DG Complete: Results for orders placed during the hospital encounter of 03/02/14  DG Foot Complete Left  Narrative CLINICAL DATA:  Left foot caught in food cart. Twisting injury, with pain and swelling about the left foot. Initial encounter.  EXAM: LEFT FOOT - COMPLETE 3+ VIEW  COMPARISON:  None.  FINDINGS: There is no evidence of fracture or dislocation. The joint spaces are preserved. There is no evidence of talar subluxation; the subtalar joint is unremarkable in appearance. Plantar and small posterior calcaneal spurs are seen. An os naviculare is noted.  No significant soft tissue abnormalities are seen.  IMPRESSION: 1. No evidence of fracture or dislocation. 2. Os naviculare noted.   Electronically Signed By: Juliane Chihuahua M.D. On: 03/02/2014 22:44   Elbow Imaging: Elbow-R DG Complete: No results found for this or any previous visit.  Elbow-L DG Complete: No results found for this or any previous visit.   Wrist Imaging: Wrist-R DG Complete: No results found for this or any previous visit.  Wrist-L DG Complete: No  results found for this or any previous visit.   Hand Imaging: Hand-R DG Complete: No results found for this or any previous visit.  Hand-L DG Complete: No results found for this or any previous visit.   Complexity Note: Imaging results reviewed.                         ROS  Cardiovascular: High blood pressure Pulmonary or Respiratory: Smoking Neurological: Seizure disorder Psychological-Psychiatric: No reported psychological or psychiatric signs or symptoms such as difficulty sleeping, anxiety, depression, delusions or hallucinations (schizophrenial), mood swings (bipolar disorders) or suicidal ideations or attempts Gastrointestinal: No reported gastrointestinal signs or symptoms such as vomiting or evacuating blood, reflux, heartburn, alternating episodes of diarrhea and constipation, inflamed or scarred liver, or pancreas or irrregular and/or infrequent bowel movements Genitourinary: Passing kidney stones Hematological: No reported hematological signs or symptoms such as prolonged bleeding, low or poor functioning platelets, bruising or bleeding easily, hereditary bleeding problems, low energy levels due to low hemoglobin or being anemic Endocrine: No reported endocrine signs or symptoms such as high or low blood sugar, rapid heart rate due to high thyroid  levels, obesity or weight gain due to slow thyroid  or thyroid  disease Rheumatologic: Rheumatoid arthritis Musculoskeletal: Negative for myasthenia gravis, muscular dystrophy, multiple sclerosis or malignant hyperthermia Work History: Out of work due to pain  Allergies  Ms. Lenig is allergic to shellfish allergy and lisinopril .  Laboratory Chemistry Profile   Renal Lab Results  Component Value Date   BUN 19 04/25/2023   CREATININE 0.83 04/25/2023   BCR NOT APPLICABLE 12/16/2019   GFR 81.67 04/25/2023  GFRAA >60 12/16/2019   GFRNONAA >60 12/16/2019   PROTEINUR NEGATIVE 09/27/2016     Electrolytes Lab Results  Component  Value Date   NA 140 04/25/2023   K 3.7 04/25/2023   CL 102 04/25/2023   CALCIUM 9.4 04/25/2023     Hepatic Lab Results  Component Value Date   AST 17 04/25/2023   ALT 16 04/25/2023   ALBUMIN 4.4 04/25/2023   ALKPHOS 76 04/25/2023   LIPASE 23 09/27/2016     ID Lab Results  Component Value Date   SARSCOV2NAA NOT DETECTED 02/21/2019   PREGTESTUR NEGATIVE 02/25/2019     Bone Lab Results  Component Value Date   VD25OH 43.69 04/23/2022     Endocrine Lab Results  Component Value Date   GLUCOSE 81 04/25/2023   GLUCOSEU NEGATIVE 09/27/2016   HGBA1C 5.3 04/25/2023   TSH 0.95 04/25/2023   FREET4 0.79 04/25/2023     Neuropathy Lab Results  Component Value Date   VITAMINB12 418 06/09/2020   HGBA1C 5.3 04/25/2023     CNS No results found for: COLORCSF, APPEARCSF, RBCCOUNTCSF, WBCCSF, POLYSCSF, LYMPHSCSF, EOSCSF, PROTEINCSF, GLUCCSF, JCVIRUS, CSFOLI, IGGCSF, LABACHR, ACETBL   Inflammation (CRP: Acute  ESR: Chronic) Lab Results  Component Value Date   CRP 2.6 06/09/2020     Rheumatology Lab Results  Component Value Date   ANA Negative 06/09/2020     Coagulation Lab Results  Component Value Date   INR 0.99 08/06/2011   LABPROT 13.3 08/06/2011   APTT 35 08/06/2011   PLT 209.0 04/25/2023   DDIMER 0.36 07/07/2014     Cardiovascular Lab Results  Component Value Date   TROPONINI 3 12/16/2019   HGB 14.2 04/25/2023   HCT 42.7 04/25/2023     Screening Lab Results  Component Value Date   SARSCOV2NAA NOT DETECTED 02/21/2019   COVIDSOURCE NASOPHARYNGEAL 02/21/2019   PREGTESTUR NEGATIVE 02/25/2019     Cancer No results found for: CEA, CA125, LABCA2   Allergens No results found for: ALMOND, APPLE, ASPARAGUS, AVOCADO, BANANA, BARLEY, BASIL, BAYLEAF, GREENBEAN, LIMABEAN, WHITEBEAN, BEEFIGE, REDBEET, BLUEBERRY, BROCCOLI, CABBAGE, MELON, CARROT, CASEIN, CASHEWNUT, CAULIFLOWER, CELERY      Note: Lab results reviewed.  PFSH  Drug: Ms. Dieguez  reports no history of drug use. Alcohol:  reports current alcohol use. Tobacco:  reports that she has been smoking cigarettes. She has never used smokeless tobacco. Medical:  has a past medical history of Allergy, Anemia, Anxiety, Arthritis, Depression, Gallstones (1998), GERD (gastroesophageal reflux disease), Hyperlipidemia, Hypertension, and Seizures (HCC). Family: family history includes Arthritis in her mother; Hearing loss in her mother; Hypertension in her brother, brother, brother, brother, mother, sister, sister, sister, sister, and sister; Other in her daughter; Stomach cancer in her mother and sister.  Past Surgical History:  Procedure Laterality Date   CHOLECYSTECTOMY     CYSTOSCOPY Bilateral 02/25/2019   Procedure: CYSTOSCOPY;  Surgeon: Taam-Akelman, Rosalea K, MD;  Location: [facility];  Service: Gynecology;  Laterality: Bilateral;   ROBOTIC ASSISTED LAPAROSCOPIC HYSTERECTOMY AND SALPINGECTOMY Bilateral 02/25/2019   Procedure: XI ROBOTIC ASSISTED LAPAROSCOPIC HYSTERECTOMY AND SALPINGECTOMY;  Surgeon: Bonnielee Rayleen POUR, MD;  Location: [facility];  Service: Gynecology;  Laterality: Bilateral;   TUBAL LIGATION     WISDOM TOOTH EXTRACTION     Active Ambulatory Problems    Diagnosis Date Noted   Essential hypertension 04/27/2018   Arthritis 04/27/2018   Cigarette nicotine dependence without complication 04/27/2018   Status post hysterectomy 02/25/2019   Obesity with body mass  index 30 or greater 08/07/2016   Menorrhagia 08/03/2016   Smoker 08/03/2016   Lumbar radiculopathy 12/24/2023   Foraminal stenosis of lumbar region 12/24/2023   Resolved Ambulatory Problems    Diagnosis Date Noted   No Resolved Ambulatory Problems   Past Medical History:  Diagnosis Date   Allergy    Anemia    Anxiety    Depression    Gallstones 1998   GERD (gastroesophageal reflux disease)     Hyperlipidemia    Hypertension    Seizures (HCC)    Constitutional Exam  General appearance: Well nourished, well developed, and well hydrated. In no apparent acute distress Vitals:   12/24/23 0806  BP: (!) 137/90  Pulse: (!) 101  Resp: 16  Temp: 98.3 F (36.8 C)  SpO2: 98%  Weight: 227 lb 9.6 oz (103.2 kg)  Height: 5' 4 (1.626 m)   BMI Assessment: Estimated body mass index is 39.07 kg/m as calculated from the following:   Height as of this encounter: 5' 4 (1.626 m).   Weight as of this encounter: 227 lb 9.6 oz (103.2 kg).  BMI interpretation table: BMI level Category Range association with higher incidence of chronic pain  <18 kg/m2 Underweight   18.5-24.9 kg/m2 Ideal body weight   25-29.9 kg/m2 Overweight Increased incidence by 20%  30-34.9 kg/m2 Obese (Class I) Increased incidence by 68%  35-39.9 kg/m2 Severe obesity (Class II) Increased incidence by 136%  >40 kg/m2 Extreme obesity (Class III) Increased incidence by 254%   Patient's current BMI Ideal Body weight  Body mass index is 39.07 kg/m. Ideal body weight: 54.7 kg (120 lb 9.5 oz) Adjusted ideal body weight: 74.1 kg (163 lb 6.3 oz)   BMI Readings from Last 4 Encounters:  12/24/23 39.07 kg/m  12/18/23 40.86 kg/m  11/18/23 40.86 kg/m  09/11/23 38.94 kg/m   Wt Readings from Last 4 Encounters:  12/24/23 227 lb 9.6 oz (103.2 kg)  12/18/23 227 lb (103 kg)  11/18/23 227 lb (103 kg)  09/11/23 214 lb 9.6 oz (97.3 kg)    Psych/Mental status: Alert, oriented x 3 (person, place, & time)       Eyes: PERLA Respiratory: No evidence of acute respiratory distress  Assessment  Primary Diagnosis & Pertinent Problem List: The primary encounter diagnosis was Chronic radicular lumbar pain. Diagnoses of Lumbar radiculopathy and Foraminal stenosis of lumbar region (left L4) were also pertinent to this visit.  Visit Diagnosis (New problems to examiner): 1. Chronic radicular lumbar pain   2. Lumbar radiculopathy   3.  Foraminal stenosis of lumbar region (left L4)    Plan of Care (Initial workup plan)  The patient presents with low back pain radiating into the left greater than right leg, consistent with radicular pain due to an L4-L5 disc herniation causing left L4 foraminal stenosis. She was referred by neurosurgery for consideration of a left L4 transforaminal epidural steroid injection, and the risks, benefits, and alternatives of the procedure were reviewed. At present, she continues on gabapentin  for neuropathic pain, which she should maintain. She has been taking Mobic , but we encouraged discontinuation and recommended transitioning to Celecoxib  100 mg twice daily with meals to improve tolerability and safety. Physical therapy should be continued with a focus on core strengthening and lumbar stabilization. We will proceed with the planned injection and reassess her symptoms and function within 2-4 weeks, with the option of repeating the procedure if necessary. She was counseled on red flag symptoms, including worsening weakness or bowel and bladder  dysfunction, which would warrant urgent evaluation.  Procedure Orders         Lumbar Transforaminal Epidural     Pharmacotherapy (current): Medications ordered:  Meds ordered this encounter  Medications   celecoxib  (CELEBREX ) 100 MG capsule    Sig: Take 1 capsule (100 mg total) by mouth 2 (two) times daily.    Dispense:  60 capsule    Refill:  0   Medications administered during this visit: Scheryl M. Astarita had no medications administered during this visit.     Provider-requested follow-up: Return in about 6 days (around 12/30/2023) for Left L4 TF ESI, in clinic (PO Valium  5mg ).  Future Appointments  Date Time Provider Department Center  12/25/2023 12:30 PM Harvey Delon NOVAK, PT [facility] None   I discussed the assessment and treatment plan with the patient. The patient was provided an opportunity to ask questions and all were answered. The patient  agreed with the plan and demonstrated an understanding of the instructions.  Patient advised to call back or seek an in-person evaluation if the symptoms or condition worsens.  Duration of encounter: .  Total time on encounter, as per AMA guidelines included both the face-to-face and non-face-to-face time personally spent by the physician and/or other qualified health care professional(s) on the day of the encounter (includes time in activities that require the physician or other qualified health care professional and does not include time in activities normally performed by clinical staff). Physician's time may include the following activities when performed: Preparing to see the patient (e.g., pre-charting review of records, searching for previously ordered imaging, lab work, and nerve conduction tests) Review of prior analgesic pharmacotherapies. Reviewing PMP Interpreting ordered tests (e.g., lab work, imaging, nerve conduction tests) Performing post-procedure evaluations, including interpretation of diagnostic procedures Obtaining and/or reviewing separately obtained history Performing a medically appropriate examination and/or evaluation Counseling and educating the patient/family/caregiver   Note by: Wallie Sherry, MD (TTS and AI technology used. I apologize for any typographical errors that were not detected and corrected.) Date: 12/24/2023; Time: 9:16 AM

## 2023-12-24 NOTE — Progress Notes (Signed)
 Safety precautions to be maintained throughout the outpatient stay will include: orient to surroundings, keep bed in low position, maintain call bell within reach at all times, provide assistance with transfer out of bed and ambulation.

## 2023-12-25 ENCOUNTER — Ambulatory Visit

## 2023-12-25 DIAGNOSIS — R293 Abnormal posture: Secondary | ICD-10-CM

## 2023-12-25 DIAGNOSIS — M6281 Muscle weakness (generalized): Secondary | ICD-10-CM

## 2023-12-25 DIAGNOSIS — M5459 Other low back pain: Secondary | ICD-10-CM

## 2023-12-25 DIAGNOSIS — M546 Pain in thoracic spine: Secondary | ICD-10-CM

## 2023-12-25 DIAGNOSIS — R262 Difficulty in walking, not elsewhere classified: Secondary | ICD-10-CM

## 2023-12-25 DIAGNOSIS — R252 Cramp and spasm: Secondary | ICD-10-CM

## 2023-12-25 NOTE — Therapy (Signed)
 OUTPATIENT PHYSICAL THERAPY TREATMENT NOTE   Patient Name: Erika Larson MRN: [id number] DOB:Sep 05, 1970, 53 y.o., female Today's Date: 12/25/2023  END OF SESSION:  PT End of Session - 12/25/23 1234     Visit Number 10    Date for Recertification  02/05/24    Authorization Type [employer]    Authorization Time Period completed recert on 12/11/23    PT Start Time 1235    PT Stop Time 1314    PT Time Calculation (min) 39 min    Activity Tolerance Patient tolerated treatment well    Behavior During Therapy WFL for tasks assessed/performed            Past Medical History:  Diagnosis Date   Allergy    SEASONAL   Anemia    Anxiety    Arthritis    Depression    Gallstones 1998   GERD (gastroesophageal reflux disease)    Hyperlipidemia    Hypertension    Seizures (HCC)    AS A CHILD,BUT NOT AS A ADULT   Past Surgical History:  Procedure Laterality Date   CHOLECYSTECTOMY     CYSTOSCOPY Bilateral 02/25/2019   Procedure: CYSTOSCOPY;  Surgeon: Taam-Akelman, Rosalea K, MD;  Location: [facility];  Service: Gynecology;  Laterality: Bilateral;   ROBOTIC ASSISTED LAPAROSCOPIC HYSTERECTOMY AND SALPINGECTOMY Bilateral 02/25/2019   Procedure: XI ROBOTIC ASSISTED LAPAROSCOPIC HYSTERECTOMY AND SALPINGECTOMY;  Surgeon: Bonnielee Rayleen POUR, MD;  Location: [facility];  Service: Gynecology;  Laterality: Bilateral;   TUBAL LIGATION     WISDOM TOOTH EXTRACTION     Patient Active Problem List   Diagnosis Date Noted   Lumbar radiculopathy 12/24/2023   Foraminal stenosis of lumbar region 12/24/2023   Status post hysterectomy 02/25/2019   Essential hypertension 04/27/2018   Arthritis 04/27/2018   Cigarette nicotine dependence without complication 04/27/2018   Obesity with body mass index 30 or greater 08/07/2016   Menorrhagia 08/03/2016   Smoker 08/03/2016    PCP: Mercer Clotilda SAUNDERS, MD  REFERRING PROVIDER: Mercer Clotilda SAUNDERS, MD  REFERRING  DIAG: M54.16 (ICD-10-CM) - Lumbar radiculopathy, acute M48.061 (ICD-10-CM) - Lumbar foraminal stenosis  Rationale for Evaluation and Treatment: Rehabilitation  THERAPY DIAG:  Other low back pain  Pain in thoracic spine  Muscle weakness (generalized)  Difficulty in walking, not elsewhere classified  Cramp and spasm  Abnormal posture  ONSET DATE: May 2025  SUBJECTIVE:                                                                                                                                                                                           SUBJECTIVE  STATEMENT: Patient reports her back started hurting again yesterday.  MD is going to do [facility] and wants her to hold on PT until after ESI to see how she responds.   PERTINENT HISTORY:  OA, GERD  PAIN:  12/25/23 Are you having pain? Yes: NPRS scale: 7/10 just really sore Pain location: lumbar spine  Pain description: when it hits, it just hits.  Shocking radiating pain Aggravating factors: walking, lifting, standing Relieving factors: unknown  PRECAUTIONS: None  RED FLAGS: None   WEIGHT BEARING RESTRICTIONS: No  FALLS:  Has patient fallen in last 6 months? 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  NEXT MD VISIT: as needed pending progress with PT  OBJECTIVE:  Note: Objective measures were completed at Evaluation unless otherwise noted.  DIAGNOSTIC FINDINGS:  Lumbar MRI on 10/02/2023: IMPRESSION: 1. At L5-S1, moderate left and mild right foraminal stenosis. 2. At L4-L5, mild left foraminal stenosis with left foraminal and extraforaminal disc protrusion closely approximates the exiting/exited left L4 nerve. 3. At L3-L4, mild canal and  bilateral subarticular recess stenosis.  PATIENT SURVEYS:  Modified Oswestry:  MODIFIED OSWESTRY DISABILITY SCALE   Score Date: 10/21/23 12/11/23  Pain intensity 5 =  Pain medication has no effect on my pain. 3  2. Personal care (washing, dressing, etc.) 2 =  It is painful to take care of myself, and I am slow and careful. 1  3. Lifting 4 = I can lift only very light weights 1  4. Walking 3 =  Pain prevents me from walking more than  mile. 2  5. Sitting 2 =  Pain prevents me from sitting more than 1 hour. 3  6. Standing 4 =  Pain prevents me from standing more than 10 minutes. 1  7. Sleeping 5 =  Pain prevents me from sleeping at all. 2  8. Social Life 3 =  Pain prevents me from going out very often. 4  9. Traveling 3 = My pain restricts my travel over 1 hour 3  10. Employment/ Homemaking 4 = Pain prevents me from doing even light duties. 4  Total 35/50 = 70% 23/50 = 46%   Interpretation of scores: Score Category Description  0-20% Minimal Disability The patient can cope with most living activities. Usually no treatment is indicated apart from advice on lifting, sitting and exercise  21-40% Moderate Disability The patient experiences more pain and difficulty with sitting, lifting and standing. Travel and social life are more difficult and they may be disabled from work. Personal care, sexual activity and sleeping are not grossly affected, and the patient can usually be managed by conservative means  41-60% Severe Disability Pain remains the main problem in this group, but activities of daily living are affected. These patients require a detailed investigation  61-80% Crippled Back pain impinges on all aspects of the patient's life. Positive intervention is required  81-100% Bed-bound  These patients are either bed-bound or exaggerating their symptoms  Bluford FORBES [name], et al. Surgery versus conservative management of stable thoracolumbar fracture: the PRESTO feasibility RCT.  [place] ([place]): [employer]; 2021 Nov. [facility],The Technology Assessment, No. 25.62.) Appendix 3, Oswestry Disability Index category descriptors. Available from: Find[employer]el[employer].cz  Minimally Clinically Important Difference (MCID) = 12.8%  COGNITION: Overall cognitive status: Within functional limits for tasks assessed     SENSATION: Patient reports numbness and tingling down left leg  MUSCLE LENGTH: Hamstrings: Tightness bilateral (left more impaired than right)  POSTURE: rounded shoulders and forward head  PALPATION: Tightness and muscle spasms along cervical paraspinals, upper trap, and lumbar paraspinals  LUMBAR ROM:   Decreased at least 50% with increased pain  12/11/23: Very limited today due to exacerbation of pain: about 75% limited  LOWER EXTREMITY ROM:     WFL  LOWER EXTREMITY MMT:    Eval: Left hip strength of 4/5 Right LE strength is WFL  LUMBAR SPECIAL TESTS:  Slump test: positive on the left side  FUNCTIONAL TESTS:  Eval: 5 times sit to stand: 13.37 sec Timed up and go (TUG): 9.34 sec  10/29/2023: 6 minute walk test:  1,137 ft with no increase of pain (age related norms 1,617 ft)  12/11/23 5 times sit to stand: 17.39 sec Timed up and go (TUG): 13.94 sec  GAIT: Distance walked: >500 ft Assistive device utilized: None Level of assistance: Complete Independence Comments: Patient states that she starts having increased pain with ambulation  PATIENT EDUCATION: Access Code: [email or web address] URL: [email or web address] Date: 12/19/2023 Prepared by: Delon Pack  Exercises - Standing Hamstring Stretch on Chair  - 1 x daily - 7 x weekly - 1 sets - 3 reps - 30 sec hold - Quadricep Stretch with Chair and Counter Support  - 1 x daily - 7 x weekly - 1 sets - 3 reps - 30 sec hold - Seated Figure 4 Piriformis Stretch  - 1 x daily - 7 x weekly - 1 sets - 3 reps - 30 sed hold - Seated Marching with Opposite Shoulder  Flexion  - 1 x daily - 7 x weekly - 3 sets - 10 reps - Supine Bridge  - 1 x daily - 7 x weekly - 3 sets - 10 reps - Supine 90/90 Alternating Heel Touches with Posterior Pelvic Tilt  - 1 x daily - 7 x weekly - 3 sets - 10 reps  TREATMENT DATE:  12/25/23 Nustep level 5 x 6 min with PT present to discuss status and plans for ESI Standing hamstring stretch 3 x 30 sec bilat Standing hip flexor/quad stretch 3 x 30 sec bil  Seated piriformis stretch 2 x 30 sec bil Prone lying x 2 min  Prone on elbows x 2 min  Prone press up, modified to low range x 10 (closely monitoring for radicular symptoms) Reviewed all of HEP and what to do between now and when [facility] is done.  Also to communicate with provider about when he would like for her to resume PT   12/19/23 Vitals at beginning of visit:  BP: 124/83 mmHg HR: 108 bpm Nustep level 5 x 6 min with PT present to discuss status and follow-up visit with MD yesterday  Seated hamstring stretch 3 x 30 sec bilat (Rt tighter than the Left) Standing hip flexor/quad stretch 3 x 30 sec bil (leg on plinth while holding onto chair in front for balance)  Seated piriformis stretch 2 x 30 sec bil Prone on elbows x 2 min for centralization  Prone press up, modified to low range x 10 (closely monitoring for radicular symptoms) Manual Quad stretch 2x20 bil  PPT x 10 in supine (verbal cueing required for the patient to keep back on the table and tactile cueing required for patient to properly engage core) Bridges x 10  PPT  with 90/90 heel tap x 10 with cueing to breathe and keep tilt throughout  Seated dying bug x 20  12/11/2023: Nustep level 5 x 6 min with PT present to discuss status and setback  Lengthy discussion regarding importance of consistency with HEP Recert visit completed Moist heat x 20 min with IFC estim 8.2 intensity    ASSESSMENT:  CLINICAL IMPRESSION: Justyce is able to return demonstration of all stretches and and core work.  She will be having  ESI soon and will be on hold until after this procedure.  She is out of work until October 10th.  We reviewed all of HEP and how to position if lying on her back as provider wanted her to do.  We also emphasized lumbar support when sitting in car or in favorite chair at home.  She is compliant and motivated.  She should respond well to [facility].    OBJECTIVE IMPAIRMENTS: decreased balance, decreased coordination, difficulty walking, decreased strength, impaired flexibility, postural dysfunction, and pain.   ACTIVITY LIMITATIONS: carrying, lifting, bending, sitting, standing, squatting, and sleeping  PARTICIPATION LIMITATIONS: meal prep, cleaning, laundry, community activity, and occupation  PERSONAL FACTORS: Time since onset of injury/illness/exacerbation and 1 comorbidity: OA are also affecting patient's functional outcome.   REHAB POTENTIAL: Good  CLINICAL DECISION MAKING: Stable/uncomplicated  EVALUATION COMPLEXITY: Low   GOALS: Goals reviewed with patient? Yes  SHORT TERM GOALS: Target date: 11/13/23  Patient will be independent with initial HEP. Baseline: Goal status: Goal met on 11/14/23  2.  Patient will participate in a 6 minute walk test to establish a baseline measurement. Baseline:  Goal status: Goal Met on 10/29/23   LONG TERM GOALS: Target date: 12/13/2023  Patient will be independent with advanced HEP to allow for self progression after discharge. Baseline:  Goal status: In Progress 12/11/23  2.  Patient will improve modified Oswestry to no greater than 55% to demonstrate improvements in functional tasks. Baseline: 70% Goal status: In progress 12/11/23  3.  Patient will increase left hip strength to [facility] to allow patient to navigate a curb and stairs. Baseline:  Goal status: Ongoing  4.  Patient will report ability to work a shift of work pain no greater than 4/10. Baseline:  Goal status: In progress 12/11/23  5.  Patient will be able to demonstrate proper posture  and body mechanics to be able to lift without increased pain. Baseline:  Goal status: Ongoing    PLAN:  PT FREQUENCY: 1-2x/week  PT DURATION: 8 weeks  PLANNED INTERVENTIONS: 97164- PT Re-evaluation, 97110-Therapeutic exercises, 97530- Therapeutic activity, [id number]- Neuromuscular re-education, 97535- Self Care, [id number]- Manual therapy, [id number]- Gait training, [phone]- Canalith repositioning, [id number]- Aquatic Therapy, [phone]- Electrical stimulation (unattended), [phone]- Electrical stimulation (manual), [id number]- Vasopneumatic device, [id number]- Ultrasound, [id number]- Traction (mechanical), [id number]- Ionotophoresis 4mg /ml Dexamethasone , [id number] (1-2 muscles), [id number] (3+ muscles)- Dry Needling, Patient/Family education, Balance training, Stair training, Taping, Joint mobilization, Joint manipulation, Spinal manipulation, Spinal mobilization, Vestibular training, Cryotherapy, and Moist heat.  PLAN FOR NEXT SESSION: On hold until after ESI.   Delon B. Markela Wee, PT 12/25/23 1:28 PM [facility] [address], [address] [place], [place] [address] Phone # [phone] Fax [phone]

## 2023-12-27 ENCOUNTER — Encounter: Payer: Self-pay | Admitting: Neurology

## 2023-12-27 ENCOUNTER — Other Ambulatory Visit: Payer: Self-pay | Admitting: Internal Medicine

## 2023-12-27 ENCOUNTER — Other Ambulatory Visit: Payer: Self-pay

## 2023-12-27 DIAGNOSIS — R202 Paresthesia of skin: Secondary | ICD-10-CM

## 2023-12-27 DIAGNOSIS — M79605 Pain in left leg: Secondary | ICD-10-CM

## 2023-12-30 ENCOUNTER — Encounter: Payer: Self-pay | Admitting: Student in an Organized Health Care Education/Training Program

## 2023-12-30 ENCOUNTER — Ambulatory Visit (HOSPITAL_BASED_OUTPATIENT_CLINIC_OR_DEPARTMENT_OTHER): Admitting: Student in an Organized Health Care Education/Training Program

## 2023-12-30 ENCOUNTER — Ambulatory Visit
Admission: RE | Admit: 2023-12-30 | Discharge: 2023-12-30 | Disposition: A | Discharge status: Home / Self Care | Source: Ambulatory Visit | Attending: Student in an Organized Health Care Education/Training Program | Admitting: Student in an Organized Health Care Education/Training Program

## 2023-12-30 DIAGNOSIS — M5416 Radiculopathy, lumbar region: Secondary | ICD-10-CM

## 2023-12-30 DIAGNOSIS — G8929 Other chronic pain: Secondary | ICD-10-CM | POA: Diagnosis present

## 2023-12-30 DIAGNOSIS — M48061 Spinal stenosis, lumbar region without neurogenic claudication: Secondary | ICD-10-CM | POA: Diagnosis not present

## 2023-12-30 MED ORDER — DIAZEPAM 5 MG PO TABS
ORAL_TABLET | ORAL | Status: AC
  Filled 2023-12-30: qty 1

## 2023-12-30 MED ORDER — ROPIVACAINE HCL 2 MG/ML IJ SOLN
INTRAMUSCULAR | Status: AC
  Filled 2023-12-30: qty 20

## 2023-12-30 MED ORDER — IOHEXOL 180 MG/ML  SOLN
10.0000 mL | Freq: Once | INTRAMUSCULAR | Status: AC
  Administered 2023-12-30: 10 mL via EPIDURAL

## 2023-12-30 MED ORDER — SODIUM CHLORIDE (PF) 0.9 % IJ SOLN
INTRAMUSCULAR | Status: AC
  Filled 2023-12-30: qty 10

## 2023-12-30 MED ORDER — LIDOCAINE HCL 2 % IJ SOLN
20.0000 mL | Freq: Once | INTRAMUSCULAR | Status: AC
  Administered 2023-12-30: 400 mg

## 2023-12-30 MED ORDER — LIDOCAINE HCL 2 % IJ SOLN
INTRAMUSCULAR | Status: AC
  Filled 2023-12-30: qty 20

## 2023-12-30 MED ORDER — DIAZEPAM 5 MG PO TABS
5.0000 mg | ORAL_TABLET | ORAL | Status: AC
  Administered 2023-12-30: 5 mg via ORAL

## 2023-12-30 MED ORDER — DEXAMETHASONE SODIUM PHOSPHATE 10 MG/ML IJ SOLN
10.0000 mg | Freq: Once | INTRAMUSCULAR | Status: AC
  Administered 2023-12-30: 10 mg

## 2023-12-30 MED ORDER — IOHEXOL 180 MG/ML  SOLN
INTRAMUSCULAR | Status: AC
  Filled 2023-12-30: qty 20

## 2023-12-30 MED ORDER — SODIUM CHLORIDE 0.9% FLUSH
1.0000 mL | Freq: Once | INTRAVENOUS | Status: AC
  Administered 2023-12-30: 1 mL

## 2023-12-30 MED ORDER — DEXAMETHASONE SODIUM PHOSPHATE 10 MG/ML IJ SOLN
INTRAMUSCULAR | Status: AC
  Filled 2023-12-30: qty 2

## 2023-12-30 MED ORDER — ROPIVACAINE HCL 2 MG/ML IJ SOLN
1.0000 mL | Freq: Once | INTRAMUSCULAR | Status: AC
  Administered 2023-12-30: 1 mL via EPIDURAL

## 2023-12-30 NOTE — Patient Instructions (Signed)

## 2023-12-30 NOTE — Progress Notes (Signed)
 Safety precautions to be maintained throughout the outpatient stay will include: orient to surroundings, keep bed in low position, maintain call bell within reach at all times, provide assistance with transfer out of bed and ambulation.

## 2023-12-30 NOTE — Progress Notes (Signed)
 PROVIDER NOTE: Interpretation of information contained herein should be left to medically-trained personnel. Specific patient instructions are provided elsewhere under Patient Instructions section of medical record. This document was created in part using STT-dictation technology, any transcriptional errors that may result from this process are unintentional.  Patient: Erika Larson Type: Established DOB: 1970/08/21 MRN: [id number] PCP: Mercer Clotilda SAUNDERS, MD  Service: Procedure DOS: 12/30/2023 Setting: Ambulatory Location: Ambulatory outpatient facility Delivery: Face-to-face Provider: Wallie Sherry, MD Specialty: Interventional Pain Management Specialty designation: 09 Location: Outpatient facility Ref. Prov.: Sherry Wallie, MD       Interventional Therapy   Procedure: Lumbar trans-foraminal epidural steroid injection (L-TFESI) #1  Laterality: Left (-LT)  Level: L4 nerve root(s) Imaging: Fluoroscopy-guided         Anesthesia: Local anesthesia (1-2% Lidocaine ) Sedation: Minimal Sedation                       DOS: 12/30/2023  Performed by: Wallie Sherry, MD  Purpose: Diagnostic/Therapeutic Indications: Lumbar radicular pain severe enough to impact quality of life or function. 1. Chronic radicular lumbar pain   2. Lumbar radiculopathy   3. Foraminal stenosis of lumbar region (left L4)    NAS-11 Pain score:   Pre-procedure: 3 /10   Post-procedure: 0-No pain/10     Position / Prep / Materials:  Position: Prone  Prep solution: ChloraPrep (2% chlorhexidine  gluconate and 70% isopropyl alcohol) Prep Area: Entire Posterior Lumbosacral Area.  From the lower tip of the scapula down to the tailbone and from flank to flank. Materials:  Tray: Block Needle(s):  Type: Spinal  Gauge (G): 22  Length: 5-in  Qty: 1     H&P (Pre-op Assessment):  Erika Larson is a 53 y.o. (year old), female patient, seen today for interventional treatment. She  has a past surgical history that  includes Tubal ligation; Wisdom tooth extraction; Cholecystectomy; Robotic assisted laparoscopic hysterectomy and salpingectomy (Bilateral, 02/25/2019); and Cystoscopy (Bilateral, 02/25/2019). Erika Larson has a current medication list which includes the following prescription(s): aspirin ec, celecoxib , gabapentin , hydrochlorothiazide , iron (ferrous sulfate ), omeprazole , spironolactone , tizanidine , and vitamin d . Her primarily concern today is the Back Pain  Initial Vital Signs:  Pulse/HCG Rate: 81ECG Heart Rate: 78 Temp: 97.6 F (36.4 C) Resp: 18 BP: (!) 171/100 (did not take bp meds, Dr Sherry notified) SpO2: 99 %  BMI: Estimated body mass index is 38.96 kg/m as calculated from the following:   Height as of this encounter: 5' 4 (1.626 m).   Weight as of this encounter: 227 lb (103 kg).  Risk Assessment: Allergies: Reviewed. She is allergic to shellfish allergy and lisinopril .  Allergy Precautions: None required Coagulopathies: Reviewed. None identified.  Blood-thinner therapy: None at this time Active Infection(s): Reviewed. None identified. Erika Larson is afebrile  Site Confirmation: Erika Larson was asked to confirm the procedure and laterality before marking the site Procedure checklist: Completed Consent: Before the procedure and under the influence of no sedative(s), amnesic(s), or anxiolytics, the patient was informed of the treatment options, risks and possible complications. To fulfill our ethical and legal obligations, as recommended by the American Medical Association's Code of Ethics, I have informed the patient of my clinical impression; the nature and purpose of the treatment or procedure; the risks, benefits, and possible complications of the intervention; the alternatives, including doing nothing; the risk(s) and benefit(s) of the alternative treatment(s) or procedure(s); and the risk(s) and benefit(s) of doing nothing. The patient was provided information about the general  risks and  possible complications associated with the procedure. These may include, but are not limited to: failure to achieve desired goals, infection, bleeding, organ or nerve damage, allergic reactions, paralysis, and death. In addition, the patient was informed of those risks and complications associated to Spine-related procedures, such as failure to decrease pain; infection (i.e.: Meningitis, epidural or intraspinal abscess); bleeding (i.e.: epidural hematoma, subarachnoid hemorrhage, or any other type of intraspinal or peri-dural bleeding); organ or nerve damage (i.e.: Any type of peripheral nerve, nerve root, or spinal cord injury) with subsequent damage to sensory, motor, and/or autonomic systems, resulting in permanent pain, numbness, and/or weakness of one or several areas of the body; allergic reactions; (i.e.: anaphylactic reaction); and/or death. Furthermore, the patient was informed of those risks and complications associated with the medications. These include, but are not limited to: allergic reactions (i.e.: anaphylactic or anaphylactoid reaction(s)); adrenal axis suppression; blood sugar elevation that in diabetics may result in ketoacidosis or comma; water retention that in patients with history of congestive heart failure may result in shortness of breath, pulmonary edema, and decompensation with resultant heart failure; weight gain; swelling or edema; medication-induced neural toxicity; particulate matter embolism and blood vessel occlusion with resultant organ, and/or nervous system infarction; and/or aseptic necrosis of one or more joints. Finally, the patient was informed that Medicine is not an exact science; therefore, there is also the possibility of unforeseen or unpredictable risks and/or possible complications that may result in a catastrophic outcome. The patient indicated having understood very clearly. We have given the patient no guarantees and we have made no promises. Enough  time was given to the patient to ask questions, all of which were answered to the patient's satisfaction. Erika Larson has indicated that she wanted to continue with the procedure. Attestation: I, the ordering provider, attest that I have discussed with the patient the benefits, risks, side-effects, alternatives, likelihood of achieving goals, and potential problems during recovery for the procedure that I have provided informed consent. Date  Time: 12/30/2023  7:54 AM  Pre-Procedure Preparation:  Monitoring: As per clinic protocol. Respiration, ETCO2, SpO2, BP, heart rate and rhythm monitor placed and checked for adequate function Safety Precautions: Patient was assessed for positional comfort and pressure points before starting the procedure. Time-out: I initiated and conducted the Time-out before starting the procedure, as per protocol. The patient was asked to participate by confirming the accuracy of the Time Out information. Verification of the correct person, site, and procedure were performed and confirmed by me, the nursing staff, and the patient. Time-out conducted as per Joint Commission's Universal Protocol (UP.01.01.01). Time: 0826 Start Time: 0826 hrs.  Description/Narrative of Procedure:          Target: The 6 o'clock position under the pedicle, on the affected side. Region: Posterolateral Lumbosacral Approach: Posterior Percutaneous Paravertebral approach.  Rationale (medical necessity): procedure needed and proper for the diagnosis and/or treatment of the patient's medical symptoms and needs. Procedural Technique Safety Precautions: Aspiration looking for blood return was conducted prior to all injections. At no point did we inject any substances, as a needle was being advanced. No attempts were made at seeking any paresthesias. Safe injection practices and needle disposal techniques used. Medications properly checked for expiration dates. SDV (single dose vial) medications  used. Description of the Procedure: Protocol guidelines were followed. The patient was placed in position over the procedure table. The target area was identified and the area prepped in the usual manner. Skin & deeper tissues infiltrated with local anesthetic. Appropriate  amount of time allowed to pass for local anesthetics to take effect. The procedure needles were then advanced to the target area. Proper needle placement secured. Negative aspiration confirmed. Solution injected in intermittent fashion, asking for systemic symptoms every 0.5cc of injectate. The needles were then removed and the area cleansed, making sure to leave some of the prepping solution back to take advantage of its long term bactericidal properties.  Vitals:   12/30/23 0805 12/30/23 0825 12/30/23 0834  BP: (!) 171/100 (!) 169/108 (!) 161/107  Pulse: 81    Resp: 18 16 15   Temp: 97.6 F (36.4 C)    SpO2: 99% 99% 99%  Weight: 227 lb (103 kg)    Height: 5' 4 (1.626 m)      Start Time: 0826 hrs. End Time: 0830 hrs.  Imaging Guidance (Spinal):          Type of Imaging Technique: Fluoroscopy Guidance (Spinal) Indication(s): Fluoroscopy guidance for needle placement to enhance accuracy in procedures requiring precise needle localization for targeted delivery of medication in or near specific anatomical locations not easily accessible without such real-time imaging assistance. Exposure Time: Please see nurses notes. Contrast: Before injecting any contrast, we confirmed that the patient did not have an allergy to iodine, shellfish, or radiological contrast. Once satisfactory needle placement was completed at the desired level, radiological contrast was injected. Contrast injected under live fluoroscopy. No contrast complications. See chart for type and volume of contrast used. Fluoroscopic Guidance: I was personally present during the use of fluoroscopy. Tunnel Vision Technique used to obtain the best possible view of the  target area. Parallax error corrected before commencing the procedure. Direction-depth-direction technique used to introduce the needle under continuous pulsed fluoroscopy. Once target was reached, antero-posterior, oblique, and lateral fluoroscopic projection used confirm needle placement in all planes. Images permanently stored in EMR. Interpretation: I personally interpreted the imaging intraoperatively. Adequate needle placement confirmed in multiple planes. Appropriate spread of contrast into desired area was observed. No evidence of afferent or efferent intravascular uptake. No intrathecal or subarachnoid spread observed. Permanent images saved into the patient's record.  Post-operative Assessment:  Post-procedure Vital Signs:  Pulse/HCG Rate: 8181 Temp: 97.6 F (36.4 C) Resp: 15 BP: (!) 161/107 SpO2: 99 %  EBL: None  Complications: No immediate post-treatment complications observed by team, or reported by patient.  Note: The patient tolerated the entire procedure well. A repeat set of vitals were taken after the procedure and the patient was kept under observation following institutional policy, for this type of procedure. Post-procedural neurological assessment was performed, showing return to baseline, prior to discharge. The patient was provided with post-procedure discharge instructions, including a section on how to identify potential problems. Should any problems arise concerning this procedure, the patient was given instructions to immediately contact us , at any time, without hesitation. In any case, we plan to contact the patient by telephone for a follow-up status report regarding this interventional procedure.  Comments:  No additional relevant information.  Plan of Care (POC)  Orders:  Orders Placed This Encounter  Procedures   DG PAIN CLINIC C-ARM 1-60 MIN NO REPORT    Intraoperative interpretation by procedural physician at [facility].    Standing Status:    Standing    Number of Occurrences:   1    Reason for exam::   Assistance in needle guidance and placement for procedures requiring needle placement in or near specific anatomical locations not easily accessible without such assistance.  Medications ordered for procedure: Meds ordered this encounter  Medications   iohexol (OMNIPAQUE) 180 MG/ML injection 10 mL    Must be Myelogram-compatible. If not available, you may substitute with a water-soluble, non-ionic, hypoallergenic, myelogram-compatible radiological contrast medium.   lidocaine  (XYLOCAINE ) 2 % (with pres) injection 400 mg   diazepam  (VALIUM ) tablet 5 mg    Make sure Flumazenil is available in the pyxis when using this medication. If oversedation occurs, administer 0.2 mg IV over 15 sec. If after 45 sec no response, administer 0.2 mg again over 1 min; may repeat at 1 min intervals; not to exceed 4 doses (1 mg)   sodium chloride  flush (NS) 0.9 % injection 1 mL   dexamethasone  (DECADRON ) injection 10 mg   ropivacaine  (PF) 2 mg/mL (0.2%) (NAROPIN ) injection 1 mL   Medications administered: We administered iohexol, lidocaine , diazepam , sodium chloride  flush, dexamethasone , and ropivacaine  (PF) 2 mg/mL (0.2%).  See the medical record for exact dosing, route, and time of administration.   Follow-up plan:   Return in about 3 weeks (around 01/20/2024) for PPE, F2F.     Recent Visits Date Type Provider Dept  12/24/23 Office Visit Marcelino Nurse, MD [facility]  Showing recent visits within past 90 days and meeting all other requirements Today's Visits Date Type Provider Dept  12/30/23 Procedure visit Marcelino Nurse, MD [facility]  Showing today's visits and meeting all other requirements Future Appointments Date Type Provider Dept  01/21/24 Appointment Marcelino Nurse, MD [facility]  Showing future appointments within next 90 days and meeting all other requirements   Disposition: Discharge home   Discharge (Date  Time): 12/30/2023; 0835 hrs.   Primary Care Physician: Mercer Clotilda SAUNDERS, MD Location: [facility] Note by: Nurse Marcelino, MD (TTS technology used. I apologize for any typographical errors that were not detected and corrected.) Date: 12/30/2023; Time: 9:24 AM  Disclaimer:  Medicine is not an Visual merchandiser. The only guarantee in medicine is that nothing is guaranteed. It is important to note that the decision to proceed with this intervention was based on the information collected from the patient. The Data and conclusions were drawn from the patient's questionnaire, the interview, and the physical examination. Because the information was provided in large part by the patient, it cannot be guaranteed that it has not been purposely or unconsciously manipulated. Every effort has been made to obtain as much relevant data as possible for this evaluation. It is important to note that the conclusions that lead to this procedure are derived in large part from the available data. Always take into account that the treatment will also be dependent on availability of resources and existing treatment guidelines, considered by other Pain Management Practitioners as being common knowledge and practice, at the time of the intervention. For Medico-Legal purposes, it is also important to point out that variation in procedural techniques and pharmacological choices are the acceptable norm. The indications, contraindications, technique, and results of the above procedure should only be interpreted and judged by a Board-Certified Interventional Pain Specialist with extensive familiarity and expertise in the same exact procedure and technique.

## 2023-12-31 ENCOUNTER — Telehealth: Payer: Self-pay | Admitting: *Deleted

## 2023-12-31 NOTE — Telephone Encounter (Signed)
Called for post procedure check. No answer. Left voicemail.

## 2024-01-03 ENCOUNTER — Telehealth: Payer: Self-pay | Admitting: Neurosurgery

## 2024-01-03 NOTE — Telephone Encounter (Signed)
 Patient's husband is calling to see if you can extend the patient's date to be off work. Her original out of work not is set to end on Oct 20th.    Thank you!

## 2024-01-07 ENCOUNTER — Telehealth: Payer: Self-pay | Admitting: *Deleted

## 2024-01-07 ENCOUNTER — Telehealth: Payer: Self-pay | Admitting: Student in an Organized Health Care Education/Training Program

## 2024-01-07 NOTE — Telephone Encounter (Signed)
 Called and spoke with patient, per Dr. Mercer, Dr. Marcelino would be the provider that would fill out her short term disability paperwork

## 2024-01-07 NOTE — Telephone Encounter (Signed)
 Spoke with Dr. Lateef, nosebleed is not likely to be related to recent LESI. Patient informed.

## 2024-01-07 NOTE — Telephone Encounter (Signed)
 Please let the patient know about Dr. Vernon response to the work note.

## 2024-01-07 NOTE — Telephone Encounter (Signed)
 Patient had a nose bleed and has never had one before. Could this be from the procedure?

## 2024-01-07 NOTE — Telephone Encounter (Signed)
 Copied from CRM [id number]. Topic: General - Other >> Jan 07, 2024  9:40 AM Franky GRADE wrote: Reason for RMF:Ejupzwu'd husband is calling because patient had an inject done by Dr. Marcelino, Wallie on 12/30/2023, patient was recommended to reach out to [facility] for an extension on her short term disability. She is set to return to work on 01/20/2024; however, she is scheduled to see Dr.Lateef on 01/21/2024 and has an appointment on 03/03/2024 for an EMG for possible nerve damage.

## 2024-01-08 NOTE — Telephone Encounter (Signed)
 Patient's spouse, Curtistine is calling to let our office know that the patient's PCP will not complete the paperwork since Dr. Claudene wrote her out of work. He states that she had an injection with Dr. Marcelino and got no relief with the injection. She is scheduled for a follow up with their office on 01/21/2024 and her EMG on 03/03/2024. He states she is not able to return to work with any kind of restrictions. Please advise.

## 2024-01-10 ENCOUNTER — Other Ambulatory Visit: Payer: Self-pay | Admitting: Family Medicine

## 2024-01-10 ENCOUNTER — Telehealth: Payer: Self-pay | Admitting: Family Medicine

## 2024-01-10 ENCOUNTER — Telehealth: Payer: Self-pay | Admitting: *Deleted

## 2024-01-10 DIAGNOSIS — M5416 Radiculopathy, lumbar region: Secondary | ICD-10-CM

## 2024-01-10 NOTE — Telephone Encounter (Signed)
 Patient's husband called back to inform us  that [facility] does not do the FCE. I called [facility], [facility], [facility] and none of them will FCE anymore. I found [facility] and set up an appointment for Erika Larson. 01/16/2024 at 10:00. She will have to pay 625.00 up front because they will not file insurance, most often insurance will not cover this test. She is going to contact Erika Larson insurance company to see if they will accept a reimbursement request from this location for this test. A referral has been placed and faxed to [facility].

## 2024-01-10 NOTE — Telephone Encounter (Signed)
 I spoke to patient's husband and informed him of the message below. He will call her PT. He states at the last visit with Dr. Claudene they decided to stop going to therapy because it wasn't helping and was causing more pain.

## 2024-01-10 NOTE — Telephone Encounter (Signed)
 Copied from CRM [id number]. Topic: Referral - Status >> Jan 10, 2024 11:23 AM Ashley R wrote: Reason for CRM: Spouse calling in on pt behalf to ask about referral for functional capacity evaluation. Unable to pay 625.00 up front and still contacting insurance to see if it could be covered but would like to know if there are any alternatives locations this can be sent that may be more affordable.

## 2024-01-10 NOTE — Telephone Encounter (Signed)
 LMOM for patient to return call.

## 2024-01-10 NOTE — Telephone Encounter (Signed)
 Copied from CRM [id number]. Topic: Clinical - Medical Advice >> Jan 10, 2024 11:41 AM Nessti S wrote: Reason for CRM: functional capacity evaluation is not covered by insurance; need other options anthony-spouse [phone]

## 2024-01-13 ENCOUNTER — Ambulatory Visit: Payer: Self-pay

## 2024-01-13 ENCOUNTER — Ambulatory Visit: Admitting: Family Medicine

## 2024-01-13 ENCOUNTER — Encounter: Payer: Self-pay | Admitting: Family Medicine

## 2024-01-13 VITALS — BP 144/84 | HR 100 | Temp 97.7°F | Ht 64.0 in | Wt 237.0 lb

## 2024-01-13 DIAGNOSIS — M5416 Radiculopathy, lumbar region: Secondary | ICD-10-CM | POA: Diagnosis not present

## 2024-01-13 DIAGNOSIS — I10 Essential (primary) hypertension: Secondary | ICD-10-CM | POA: Diagnosis not present

## 2024-01-13 NOTE — Telephone Encounter (Signed)
 FYI Only or Action Required?: FYI only for provider.  Patient was last seen in primary care on 09/11/2023 by Mercer Clotilda SAUNDERS, MD.  Called Nurse Triage reporting Leg Pain.  Symptoms began worsening x week.  Interventions attempted: Rest, hydration, or home remedies.  Symptoms are: gradually worsening.  Triage Disposition: No disposition on file.  Patient/caregiver understands and will follow disposition?:    Copied from CRM [id number]. Topic: Clinical - Red Word Triage >> Jan 13, 2024  8:14 AM Mia F wrote: Red Word that prompted transfer to Nurse Triage:  Sharp pain in back going down to the legs. Hard for pt to walk. Some swelling in lower back. Had a cortisone shot in her back last week and pain started and has been worseining since. Reason for Disposition  Localized pain, redness or hard lump along vein  Answer Assessment - Initial Assessment Questions 1. ONSET: When did the pain start?      Started in June and worsening x week 2. LOCATION: Where is the pain located?      Bilateral legs 3. PAIN: How bad is the pain?    (Scale 1-10; or mild, moderate, severe)     moderate 4. WORK OR EXERCISE: Has there been any recent work or exercise that involved this part of the body?      na 5. CAUSE: What do you think is causing the leg pain?     unknown 6. OTHER SYMPTOMS: Do you have any other symptoms? (e.g., chest pain, back pain, breathing difficulty, swelling, rash, fever, numbness, weakness)     Weakness, increased SOB at times at rest & w/ambulation,  7. PREGNANCY: Is there any chance you are pregnant? When was your last menstrual period?     na  Sharp pain in back going bilateral legs. Pt's husband states when taking a shower it causes pt more pain.  In middle/spin area husband states swelling and warmth.  Protocols used: Leg Pain-A-AH

## 2024-01-13 NOTE — Progress Notes (Signed)
 Established Patient Office Visit   Subjective  Patient ID: Erika Larson, female    DOB: 01/12/1971  Age: 53 y.o. MRN: [id number]  Chief Complaint  Patient presents with   Acute Visit    Back & leg pain since 6/25  Pain 6/10  SOB since 12/30/23 when she got a ESI with DR. Lateef    Pt is a 53 yo female seen for f/u on back pain and leg pain.    Pt seen by Neurosurgery and PT.  Endorses worsened back pain s/p injection on 12/27/23. The injection initially provided relief, but the pain returned by January 09, 2024. The pain is severe, affecting her ability to stand and sit comfortably. Additionally, she reports dizziness and weakness, particularly when standing up, which started a few days post-injection and epistaxis 1 wk s/p the injection.  Feels like legs giving out and dragging her foot occasionally.  Has EMG/NCS scheduled for Dec 2nd with Neurology.  Using a recliner with vibrating features for relief, but notes it no longer helps. She is currently using her sick time and is concerned about returning to work in a few wks without restrictions, as she cannot lift heavy objects.  Pt was advised by Neurosurgery to have a functional capacity evaluation, but she has not done this due to the cost ~$600.  Taking bp medications hydrochlorothiazide  and spironolactone .    Patient Active Problem List   Diagnosis Date Noted   Lumbar radiculopathy 12/24/2023   Foraminal stenosis of lumbar region 12/24/2023   Status post hysterectomy 02/25/2019   Essential hypertension 04/27/2018   Arthritis 04/27/2018   Cigarette nicotine dependence without complication 04/27/2018   Obesity with body mass index 30 or greater 08/07/2016   Menorrhagia 08/03/2016   Smoker 08/03/2016   Past Medical History:  Diagnosis Date   Allergy    SEASONAL   Anemia    Anxiety    Arthritis    Depression    Gallstones 1998   GERD (gastroesophageal reflux disease)    Hyperlipidemia    Hypertension     Seizures (HCC)    AS A CHILD,BUT NOT AS A ADULT   Past Surgical History:  Procedure Laterality Date   CHOLECYSTECTOMY     CYSTOSCOPY Bilateral 02/25/2019   Procedure: CYSTOSCOPY;  Surgeon: Taam-Akelman, Rosalea K, MD;  Location: [facility];  Service: Gynecology;  Laterality: Bilateral;   ROBOTIC ASSISTED LAPAROSCOPIC HYSTERECTOMY AND SALPINGECTOMY Bilateral 02/25/2019   Procedure: XI ROBOTIC ASSISTED LAPAROSCOPIC HYSTERECTOMY AND SALPINGECTOMY;  Surgeon: Bonnielee Rayleen POUR, MD;  Location: [facility];  Service: Gynecology;  Laterality: Bilateral;   TUBAL LIGATION     WISDOM TOOTH EXTRACTION     Social History   Tobacco Use   Smoking status: Every Day    Current packs/day: 0.25    Types: Cigarettes   Smokeless tobacco: Never  Vaping Use   Vaping status: Never Used  Substance Use Topics   Alcohol use: Yes    Comment: occ   Drug use: No   Family History  Problem Relation Age of Onset   Stomach cancer Mother    Arthritis Mother    Hearing loss Mother    Hypertension Mother    Stomach cancer Sister    Hypertension Sister    Hypertension Sister    Hypertension Sister    Hypertension Sister    Hypertension Sister    Hypertension Brother    Hypertension Brother    Hypertension Brother    Hypertension Brother  Other Daughter        died in MVA   Colon cancer Neg Hx    Colon polyps Neg Hx    Esophageal cancer Neg Hx    Crohn's disease Neg Hx    Rectal cancer Neg Hx    Ulcerative colitis Neg Hx    Allergies  Allergen Reactions   Shellfish Allergy Anaphylaxis and Swelling   Lisinopril      Dry cough    ROS Negative unless stated above    Objective:     BP (!) 144/84   Pulse 100   Temp 97.7 F (36.5 C)   Ht 5' 4 (1.626 m)   Wt 237 lb (107.5 kg)   LMP 02/23/2019   SpO2 97%   BMI 40.68 kg/m  BP Readings from Last 3 Encounters:  01/13/24 (!) 144/84  12/30/23 (!) 161/107  12/24/23 (!) 137/90   Wt Readings from Last  3 Encounters:  01/13/24 237 lb (107.5 kg)  12/30/23 227 lb (103 kg)  12/24/23 227 lb 9.6 oz (103.2 kg)      Physical Exam Neurological:     Cranial Nerves: Cranial nerves 2-12 are intact.     Gait: Gait is intact.     Deep Tendon Reflexes:     Reflex Scores:      Patellar reflexes are 2+ on the right side and 1+ on the left side.       12/30/2023    8:12 AM 12/24/2023    8:05 AM 07/24/2023    3:43 PM  Depression screen PHQ 2/9  Decreased Interest 0 0 0  Down, Depressed, Hopeless 0 0 0  PHQ - 2 Score 0 0 0  Altered sleeping   0  Tired, decreased energy   0  Change in appetite   0  Feeling bad or failure about yourself    0  Trouble concentrating   0  Moving slowly or fidgety/restless   0  Suicidal thoughts   0  PHQ-9 Score   0      07/24/2023    3:43 PM 04/25/2023    1:14 PM 06/09/2020    3:33 PM 04/28/2018    5:45 PM  GAD 7 : Generalized Anxiety Score  Nervous, Anxious, on Edge 0 1 1 0  Control/stop worrying 0 1 1 1   Worry too much - different things 0 1 1 1   Trouble relaxing 0 1 1 1   Restless 0 0 1 0  Easily annoyed or irritable 0 1 1 0  Afraid - awful might happen 0 1 1 1   Total GAD 7 Score 0 6 7 4   Anxiety Difficulty  Not difficult at all       No results found for any visits on 01/13/24.    Assessment & Plan:   Lumbar radiculopathy  Essential hypertension    Chronic low back pain with radiation.   MRI lumbar spine 10/02/23 with L5-S1 moderate left and mild right foraminal stenosis.  L4-L5 mild left foraminal stenosis with left foraminal and extraforaminal disc protrusion closely approximates the exiting and exiting left L4 nerve.  L3-L4 mild canal and bilateral subarticular recess stenosis. Lumbar spine xray 11/18/23 with progressive DDD at L3-L4, L4-L5, L5-S1.  Lower lumbar facet hypertrophy.  Continued symptoms s/p injection.  Advised to discuss with pain management the injection's effectiveness and explore alternative treatments. Cancel the second  injection if deemed ineffective after the discussion. Currently no indication per discussion with Neurosurg for surgery or for inability to  return to work.  Discuss the necessity of a functional capacity evaluation if pt feels she is unable to work. Advised to keep appt Dec 2nd for NCS/EMG.  BP elevated, possibly due to discomfort. Recheck.  Lifestyle modifications advised.  Continue current meds spironolactone  12.5 mg and hydrochlorothiazide  12.5 mg daily.  Monitor bp at home.  Return if symptoms worsen or fail to improve.   Clotilda JONELLE Single, MD

## 2024-01-13 NOTE — Telephone Encounter (Signed)
 Reason for Disposition . Numbness in a leg or foot (i.e., loss of sensation)  Answer Assessment - Initial Assessment Questions 1. ONSET: When did the pain begin? (e.g., minutes, hours, days)     X week 2. LOCATION: Where does it hurt? (upper, mid or lower back)     Low back pain 3. SEVERITY: How bad is the pain?  (e.g., Scale 1-10; mild, moderate, or severe)     moderate 4. PATTERN: Is the pain constant? (e.g., yes, no; constant, intermittent)      constant 5. RADIATION: Does the pain shoot into your legs or somewhere else?     Bilateral legs 6. CAUSE:  What do you think is causing the back pain?      unknown 7. BACK OVERUSE:  Any recent lifting of heavy objects, strenuous work or exercise?     no 8. MEDICINES: What have you taken so far for the pain? (e.g., nothing, acetaminophen , NSAIDS)     Cortisone - shot x week 9. NEUROLOGIC SYMPTOMS: Do you have any weakness, numbness, or problems with bowel/bladder control?     weakness 10. OTHER SYMPTOMS: Do you have any other symptoms? (e.g., fever, abdomen pain, burning with urination, blood in urine)       no 11. PREGNANCY: Is there any chance you are pregnant? When was your last menstrual period?       na  Protocols used: Back Pain-A-AH

## 2024-01-14 ENCOUNTER — Telehealth: Payer: Self-pay | Admitting: Physician Assistant

## 2024-01-14 NOTE — Telephone Encounter (Signed)
 Talk to patient's husband on the phone.  He states that his voice pain has become worse since her injection a couple of weeks ago.  I was unable to talk to the patient because she was sleeping, but she continues to take Zanaflex , gabapentin , Celebrex .  Plan to see in clinic tomorrow morning to evaluate.

## 2024-01-15 ENCOUNTER — Encounter: Payer: Self-pay | Admitting: Physician Assistant

## 2024-01-15 ENCOUNTER — Ambulatory Visit: Admitting: Physician Assistant

## 2024-01-15 VITALS — BP 130/84 | Ht 64.0 in | Wt 236.0 lb

## 2024-01-15 DIAGNOSIS — M5416 Radiculopathy, lumbar region: Secondary | ICD-10-CM

## 2024-01-15 NOTE — Progress Notes (Signed)
 Referring Physician:  Mercer Clotilda SAUNDERS, MD [address] [place],  [place] [address]  Primary Physician:  Mercer Clotilda SAUNDERS, MD  History of Present Illness: 01/15/2024 Patient seen in follow-up today for back pain.  It is alternated for her whether it radiates into her right lower extremity or left lower extremity.  Currently however her back pain is bothering her more than her legs and radiates across the lower part of her back.  It intermittently goes to the front of her left leg to the level of her knee.  Previously had a left L4 ESI which she states helped for 2 days.  She continues to have trouble changing positions and is no longer doing physical therapy.  She has tried to do some home exercises but is having trouble.  She continues to use Celebrex , gabapentin , tizanidine  to help sleep.  No new weakness that she is noted.  Duration: April 2025 Severity: 7/10  Precipitating: aggravated by prolonged standing Modifying factors: made better by nothing Weakness: none Timing: constant Bowel/Bladder Dysfunction: none  Conservative measures:  Physical therapy: currently participating in through [facility], helps for that day only    Multimodal medical therapy including regular antiinflammatories: prednisone , meloxicam , gabapentin , ibuprofen   Injections: no epidural steroid injections  Past Surgery: no spinal surgeries   Erika Larson has no symptoms of cervical myelopathy.  The symptoms are causing a significant impact on the patient's life.   Review of Systems:  A 10 point review of systems is negative, except for the pertinent positives and negatives detailed in the HPI.  Past Medical History: Past Medical History:  Diagnosis Date   Allergy    SEASONAL   Anemia    Anxiety    Arthritis    Depression    Gallstones 1998   GERD (gastroesophageal reflux disease)    Hyperlipidemia    Hypertension    Seizures (HCC)    AS A CHILD,BUT NOT AS A ADULT     Past Surgical History: Past Surgical History:  Procedure Laterality Date   CHOLECYSTECTOMY     CYSTOSCOPY Bilateral 02/25/2019   Procedure: CYSTOSCOPY;  Surgeon: Taam-Akelman, Rosalea K, MD;  Location: [facility];  Service: Gynecology;  Laterality: Bilateral;   ROBOTIC ASSISTED LAPAROSCOPIC HYSTERECTOMY AND SALPINGECTOMY Bilateral 02/25/2019   Procedure: XI ROBOTIC ASSISTED LAPAROSCOPIC HYSTERECTOMY AND SALPINGECTOMY;  Surgeon: Bonnielee Rayleen POUR, MD;  Location: [facility];  Service: Gynecology;  Laterality: Bilateral;   TUBAL LIGATION     WISDOM TOOTH EXTRACTION      Allergies: Allergies as of 01/15/2024 - Review Complete 01/15/2024  Allergen Reaction Noted   Shellfish allergy Anaphylaxis and Swelling 08/06/2011   Lisinopril   12/16/2019    Medications: Outpatient Encounter Medications as of 01/15/2024  Medication Sig   aspirin EC 81 MG tablet Take 81 mg by mouth daily. Swallow whole.   celecoxib  (CELEBREX ) 100 MG capsule Take 1 capsule (100 mg total) by mouth 2 (two) times daily.   gabapentin  (NEURONTIN ) 300 MG capsule Take 1 capsule (300 mg total) by mouth 3 (three) times daily.   hydrochlorothiazide  (HYDRODIURIL ) 12.5 MG tablet TAKE 1 TABLET BY MOUTH EVERY DAY   Iron, Ferrous Sulfate , 325 (65 Fe) MG TABS Take 325 mg by mouth daily.    omeprazole  (PRILOSEC) 20 MG capsule TAKE 1 CAPSULE(20 MG) BY MOUTH DAILY   spironolactone  (ALDACTONE ) 25 MG tablet TAKE 1/2 TABLET BY MOUTH DAILY   tiZANidine  (ZANAFLEX ) 4 MG tablet Take 1 tablet (4 mg total)  by mouth 3 (three) times daily.   VITAMIN D  PO Take by mouth. TAKE GUMMIES DAILY   No facility-administered encounter medications on file as of 01/15/2024.    Social History: Social History   Tobacco Use   Smoking status: Every Day    Current packs/day: 0.25    Types: Cigarettes   Smokeless tobacco: Never  Vaping Use   Vaping status: Never Used  Substance Use Topics   Alcohol use: Yes     Comment: occ   Drug use: No    Family Medical History: Family History  Problem Relation Age of Onset   Stomach cancer Mother    Arthritis Mother    Hearing loss Mother    Hypertension Mother    Stomach cancer Sister    Hypertension Sister    Hypertension Sister    Hypertension Sister    Hypertension Sister    Hypertension Sister    Hypertension Brother    Hypertension Brother    Hypertension Brother    Hypertension Brother    Other Daughter        died in MVA   Colon cancer Neg Hx    Colon polyps Neg Hx    Esophageal cancer Neg Hx    Crohn's disease Neg Hx    Rectal cancer Neg Hx    Ulcerative colitis Neg Hx     Physical Examination:   NEUROLOGICAL:     Awake, alert, oriented to person, place, and time.  Speech is clear and fluent. Fund of knowledge is appropriate.   Cranial Nerves: Pupils equal round and reactive to light.  Facial tone is symmetric.   ROM of spine: Mild tenderness to palpation of lumbar paraspinals.  No swelling was noted.    Strength:  Side Iliopsoas Quads Hamstring PF DF EHL  R 5 5 5 5 5 5   L 5 5 5 5 5 5    Reflexes are 2+ at the right patella, absent on the left patella.  1+ bilateral Achilles. Clonus is not present.  Toes are down-going.  Bilateral upper and lower extremity sensation is intact to light touch.    Gait is normal.    Medical Decision Making  Imaging: EXAM: MRI LUMBAR SPINE WITHOUT CONTRAST   TECHNIQUE: Multiplanar, multisequence MR imaging of the lumbar spine was performed. No intravenous contrast was administered.   COMPARISON:  Lumbar radiographs February 07, 2016.   FINDINGS: Segmentation:  Standard.   Alignment:  Physiologic.   Vertebrae:  No fracture, evidence of discitis, or bone lesion.   Conus medullaris and cauda equina: Conus extends to the L1-L2 level. Conus and cauda equina appear normal.   Paraspinal and other soft tissues: Unremarkable.   Disc levels:   T12-L1: No significant disc  protrusion, foraminal stenosis, or canal stenosis.   L1-L2: No significant disc protrusion, foraminal stenosis, or canal stenosis.   L2-L3: No significant disc protrusion, foraminal stenosis, or canal stenosis.   L3-L4: Broad disc bulge with bilateral facet arthropathy and inferiorly directed right subarticular disc protrusion. Resulting mild canal and mild bilateral subarticular recess stenosis. Patent foramina.   L4-L5: Left foraminal and extraforaminal disc protrusion closely approximates the exiting/exited left L4 nerve. Mild left foraminal stenosis. Patent canal and right foramen.   L5-S1: Disc bulging endplate spurring with bilateral facet arthropathy peers Ult Ng moderate left and mild right foraminal stenosis.   IMPRESSION: 1. At L5-S1, moderate left and mild right foraminal stenosis. 2. At L4-L5, mild left foraminal stenosis with left foraminal and extraforaminal  disc protrusion closely approximates the exiting/exited left L4 nerve. 3. At L3-L4, mild canal and bilateral subarticular recess stenosis.    I have personally reviewed the images and agree with the above interpretation.  Assessment and Plan: Erika Larson is a pleasant 54 y.o. female is here today with a chief complaint of low back pain.  Initially this was radiating into the left lower extremity and then stopped and began radiating into the right and is now radiating into the left lower extremity again.  Goes from her back into the top of her left leg to the level of her knee.  This is intermittent.  This improves slightly after an injection which lasted for 2 days.  Primary complaint is back pain and pain with position changes.  Examination is to baseline.  Discussed case with Dr. Claudene.  At this time, not confident that this patient would benefit from any surgical intervention given migrating pain.  Would still like to see EMG results.  She may trial injections again.  We also discussed how weight loss could  potentially benefit her pain after she inquired about this.  Plan to follow-up with Dr. Claudene after her next round of injections.  Encouraged her to continue physical therapy.  Discussed with patient she would need to get disability paperwork through her primary care provider unless we were going to move forward with surgery.  We did recommend that she could go forward with a functional capacity examination but the patient states that she would not be able to afford that right now.   Spent over 30 minutes in face-to-face and non-face-to-face activities including preparing to see the patient, performing medically appropriate examination, counseling the patient and their family,  documenting clinical information, independently interpreting results, coordination of care.  Lyle Decamp, PA-C Dept. of Neurosurgery

## 2024-01-15 NOTE — Telephone Encounter (Signed)
 Patient seen 01/13/24.

## 2024-01-16 ENCOUNTER — Telehealth: Payer: Self-pay | Admitting: *Deleted

## 2024-01-16 NOTE — Telephone Encounter (Signed)
 Patients spouse called inquiring about LOA letter again. They are unable to get FCE at this time- informed to call PCP office for this request

## 2024-01-16 NOTE — Telephone Encounter (Signed)
 Copied from CRM #[id number]. Topic: General - Other >> Jan 16, 2024  1:16 PM Franky GRADE wrote: Reason for CRM: Patient's husband Curtistine is calling because patient was seen yesterday by Ulis Bottcher Neurosurgery. Bottcher Ulis advised patient she will be emailing Dr.Banks and they would like to know if they email was received regarding patient's leave if absence from work.

## 2024-01-17 ENCOUNTER — Telehealth: Payer: Self-pay | Admitting: Physician Assistant

## 2024-01-17 NOTE — Telephone Encounter (Signed)
 This was requested by Neurosurg.

## 2024-01-17 NOTE — Telephone Encounter (Signed)
 Copied from CRM [id number]. Topic: General - Other >> Jan 17, 2024 10:05 AM Jasmin G wrote: Reason for CRM: Pt's husband called to check on the status of pt's request for an absence from work, please refer to recent Encounters for more info, pt's husband stated that pt needs this absence today by 4:00 p.m or she will be released from work.

## 2024-01-17 NOTE — Telephone Encounter (Signed)
 Patient's spouse is calling to see if you were able to get in touch with Dr. Burnetta after she was seen in our office on Wednesday.

## 2024-01-17 NOTE — Telephone Encounter (Unsigned)
 Copied from CRM [id number]. Topic: General - Call Back - No Documentation >> Jan 17, 2024  2:51 PM Rea C wrote: Reason for CRM: Patient's husband called in stating that he needs a letter stating that she is out of work. HR told patient's husband that if they don't receive the paper before the end of the day today, they are going to release her at 100%.    [phone] (M)

## 2024-01-17 NOTE — Telephone Encounter (Signed)
 Per discussion with Neurosurgery, pt was advised she could return to work or have the functional evaluation completed.

## 2024-01-20 ENCOUNTER — Telehealth: Payer: Self-pay | Admitting: Physician Assistant

## 2024-01-20 ENCOUNTER — Telehealth: Payer: Self-pay

## 2024-01-20 ENCOUNTER — Other Ambulatory Visit: Payer: Self-pay | Admitting: Physician Assistant

## 2024-01-20 NOTE — Telephone Encounter (Signed)
 Patient's spouse notified and expressed understanding.

## 2024-01-20 NOTE — Telephone Encounter (Signed)
 Pt is needing to know how much longer that they will need to be out of work, per Dr.Banks. They did fill out fmla, but they are wondering if there is another paper to show the exact time out that can be sent to their HR.

## 2024-01-20 NOTE — Telephone Encounter (Signed)
 Called and left a voice message asking patient to give the office a call back at [phone].

## 2024-01-20 NOTE — Telephone Encounter (Signed)
 Spoke with patient, Per Dr. Mercer the patient would need to ask neuro to Refill out the [facility] paperwork. Patient is aware

## 2024-01-20 NOTE — Telephone Encounter (Signed)
 Copied from CRM #[id number]. Topic: Clinical - Medical Advice >> Jan 20, 2024 10:34 AM Rea ORN wrote: Reason for CRM: Pt called to advise she missed a call from [place] today. Pt stated she needs an extension on short term disability paperwork. Please see 01/16/24 encounter. Pt stated her provider at [facility] and PCP both told her she needs an extension to be off work. Pt short term disability ran out today and in order for her to continue to be paid she needs PCP to write that extension. Please call back

## 2024-01-21 ENCOUNTER — Other Ambulatory Visit: Payer: Self-pay | Admitting: Student in an Organized Health Care Education/Training Program

## 2024-01-21 ENCOUNTER — Ambulatory Visit
Attending: Student in an Organized Health Care Education/Training Program | Admitting: Student in an Organized Health Care Education/Training Program

## 2024-01-21 ENCOUNTER — Encounter: Payer: Self-pay | Admitting: Student in an Organized Health Care Education/Training Program

## 2024-01-21 VITALS — BP 144/101 | HR 85 | Temp 98.3°F | Resp 16 | Ht 65.0 in | Wt 237.0 lb

## 2024-01-21 DIAGNOSIS — G8929 Other chronic pain: Secondary | ICD-10-CM | POA: Insufficient documentation

## 2024-01-21 DIAGNOSIS — M5416 Radiculopathy, lumbar region: Secondary | ICD-10-CM | POA: Insufficient documentation

## 2024-01-21 DIAGNOSIS — M48061 Spinal stenosis, lumbar region without neurogenic claudication: Secondary | ICD-10-CM | POA: Insufficient documentation

## 2024-01-21 NOTE — Progress Notes (Signed)
 Safety precautions to be maintained throughout the outpatient stay will include: orient to surroundings, keep bed in low position, maintain call bell within reach at all times, provide assistance with transfer out of bed and ambulation.

## 2024-01-21 NOTE — Progress Notes (Signed)
 PROVIDER NOTE: Interpretation of information contained herein should be left to medically-trained personnel. Specific patient instructions are provided elsewhere under Patient Instructions section of medical record. This document was created in part using AI and STT-dictation technology, any transcriptional errors that may result from this process are unintentional.  Patient: Erika Larson  Service: E/M   PCP: Mercer Clotilda SAUNDERS, MD  DOB: 1970/06/19  DOS: 01/21/2024  Provider: Wallie Sherry, MD  MRN: [id number]  Delivery: Face-to-face  Specialty: Interventional Pain Management  Type: Established Patient  Setting: Ambulatory outpatient facility  Specialty designation: 09  Referring Prov.: Mercer Clotilda SAUNDERS, MD  Location: Outpatient office facility       History of present illness (HPI) [name], a 53 y.o. year old female, is here today because of her Chronic radicular lumbar pain [M54.16, G89.29]. [name]'s primary complain today is Back Pain (Lumbar left )  Pertinent problems: [name] does not have any pertinent problems on file.  Pain Assessment: Severity of Chronic pain is reported as a 5 /10. Location: Back Lower, Left/into left leg to just about the shin. Onset: More than a month ago. Quality: Discomfort, Aching. Timing: Intermittent. Modifying factor(s): stretches, warm showers, medications. Vitals:  height is 5' 5 (1.651 m) and weight is 237 lb (107.5 kg). Her temporal temperature is 98.3 F (36.8 C). Her blood pressure is 144/101 (abnormal) and her pulse is 85. Her respiration is 16 and oxygen saturation is 100%.  BMI: Estimated body mass index is 39.44 kg/m as calculated from the following:   Height as of this encounter: 5' 5 (1.651 m).   Weight as of this encounter: 237 lb (107.5 kg).  Last encounter: 12/24/2023. Last procedure: 12/30/2023.  Reason for encounter:   History of Present Illness   Erika Larson is a 53 year old female who  presents with persistent pain following a spinal injection.  She experiences persistent pain following a spinal injection, with no significant change in pain levels post-procedure. The pain alternates between her right and left legs, initially starting on the left side and moving downwards.  She experienced dizziness in the shower the day after the injection and had epistaxis from her left nostril the following day.  No use of over-the-counter medications such as ibuprofen , Advil , or aspirin.       Post-Procedure Evaluation   Procedure: Lumbar trans-foraminal epidural steroid injection (L-TFESI) #1  Laterality: Left (-LT)  Level: L4 nerve root(s) Imaging: Fluoroscopy-guided         Anesthesia: Local anesthesia (1-2% Lidocaine ) Sedation: Minimal Sedation                       DOS: 12/30/2023  Performed by: Wallie Sherry, MD  Purpose: Diagnostic/Therapeutic Indications: Lumbar radicular pain severe enough to impact quality of life or function. 1. Chronic radicular lumbar pain   2. Lumbar radiculopathy   3. Foraminal stenosis of lumbar region (left L4)    NAS-11 Pain score:   Pre-procedure: 3 /10   Post-procedure: 0-No pain/10     Effectiveness:  Initial hour after procedure: 50 %  Subsequent 4-6 hours post-procedure: 50 %  Analgesia past initial 6 hours: 50 % (pain relief x 3 - 4 days then pain returned all at once.)  Ongoing improvement:  Analgesic:  50% Function: Somewhat improved ROM: Back to baseline  ROS  Constitutional: Denies any fever or chills Gastrointestinal: No reported hemesis, hematochezia, vomiting, or acute GI distress Musculoskeletal: Denies any acute onset joint  swelling, redness, loss of ROM, or weakness Neurological: low back and left leg pain  Medication Review  Iron (Ferrous Sulfate ), Vitamin D , aspirin EC, celecoxib , gabapentin , hydrochlorothiazide , omeprazole , spironolactone , and tiZANidine   History Review  Allergy: [name] is allergic to  shellfish allergy and lisinopril . Drug: [name]  reports no history of drug use. Alcohol:  reports current alcohol use. Tobacco:  reports that she has been smoking cigarettes. She has never used smokeless tobacco. Social: [name]  reports that she has been smoking cigarettes. She has never used smokeless tobacco. She reports current alcohol use. She reports that she does not use drugs. Medical:  has a past medical history of Allergy, Anemia, Anxiety, Arthritis, Depression, Gallstones (1998), GERD (gastroesophageal reflux disease), Hyperlipidemia, Hypertension, and Seizures (HCC). Surgical: [name]  has a past surgical history that includes Tubal ligation; Wisdom tooth extraction; Cholecystectomy; Robotic assisted laparoscopic hysterectomy and salpingectomy (Bilateral, 02/25/2019); and Cystoscopy (Bilateral, 02/25/2019). Family: family history includes Arthritis in her mother; Hearing loss in her mother; Hypertension in her brother, brother, brother, brother, mother, sister, sister, sister, sister, and sister; Other in her daughter; Stomach cancer in her mother and sister.  Laboratory Chemistry Profile   Renal Lab Results  Component Value Date   BUN 19 04/25/2023   CREATININE 0.83 04/25/2023   BCR NOT APPLICABLE 12/16/2019   GFR 81.67 04/25/2023   GFRAA >60 12/16/2019   GFRNONAA >60 12/16/2019    Hepatic Lab Results  Component Value Date   AST 17 04/25/2023   ALT 16 04/25/2023   ALBUMIN 4.4 04/25/2023   ALKPHOS 76 04/25/2023   LIPASE 23 09/27/2016    Electrolytes Lab Results  Component Value Date   NA 140 04/25/2023   K 3.7 04/25/2023   CL 102 04/25/2023   CALCIUM 9.4 04/25/2023    Bone Lab Results  Component Value Date   VD25OH 43.69 04/23/2022    Inflammation (CRP: Acute Phase) (ESR: Chronic Phase) Lab Results  Component Value Date   CRP 2.6 06/09/2020         Note: Above Lab results reviewed.  Recent Imaging Review  CLINICAL DATA:  Myelopathy, chronic,  lumbar spine, worsening LLE radiculopathy, anterior lateral thigh pain waking pt up at night   EXAM: MRI LUMBAR SPINE WITHOUT CONTRAST   TECHNIQUE: Multiplanar, multisequence MR imaging of the lumbar spine was performed. No intravenous contrast was administered.   COMPARISON:  Lumbar radiographs February 07, 2016.   FINDINGS: Segmentation:  Standard.   Alignment:  Physiologic.   Vertebrae:  No fracture, evidence of discitis, or bone lesion.   Conus medullaris and cauda equina: Conus extends to the L1-L2 level. Conus and cauda equina appear normal.   Paraspinal and other soft tissues: Unremarkable.   Disc levels:   T12-L1: No significant disc protrusion, foraminal stenosis, or canal stenosis.   L1-L2: No significant disc protrusion, foraminal stenosis, or canal stenosis.   L2-L3: No significant disc protrusion, foraminal stenosis, or canal stenosis.   L3-L4: Broad disc bulge with bilateral facet arthropathy and inferiorly directed right subarticular disc protrusion. Resulting mild canal and mild bilateral subarticular recess stenosis. Patent foramina.   L4-L5: Left foraminal and extraforaminal disc protrusion closely approximates the exiting/exited left L4 nerve. Mild left foraminal stenosis. Patent canal and right foramen.   L5-S1: Disc bulging endplate spurring with bilateral facet arthropathy peers Ult Ng moderate left and mild right foraminal stenosis.   IMPRESSION: 1. At L5-S1, moderate left and mild right foraminal stenosis. 2. At L4-L5, mild left foraminal  stenosis with left foraminal and extraforaminal disc protrusion closely approximates the exiting/exited left L4 nerve. 3. At L3-L4, mild canal and bilateral subarticular recess stenosis.     Electronically Signed   By: Gilmore GORMAN Molt M.D.   On: 10/02/2023 19:52   Note: Reviewed        Physical Exam  Vitals: BP (!) 144/101   Pulse 85   Temp 98.3 F (36.8 C) (Temporal)   Resp 16   Ht 5' 5  (1.651 m)   Wt 237 lb (107.5 kg)   LMP 02/23/2019   SpO2 100%   BMI 39.44 kg/m  BMI: Estimated body mass index is 39.44 kg/m as calculated from the following:   Height as of this encounter: 5' 5 (1.651 m).   Weight as of this encounter: 237 lb (107.5 kg). Ideal: Ideal body weight: 57 kg (125 lb 10.6 oz) Adjusted ideal body weight: 77.2 kg (170 lb 3.2 oz) General appearance: Well nourished, well developed, and well hydrated. In no apparent acute distress Mental status: Alert, oriented x 3 (person, place, & time)       Respiratory: No evidence of acute respiratory distress Eyes: PERLA Lumbar Spine Area Exam  Skin & Axial Inspection: No masses, redness, or swelling Alignment: Symmetrical Functional ROM: Pain restricted ROM       Stability: No instability detected Muscle Tone/Strength: Functionally intact. No obvious neuro-muscular anomalies detected. Sensory (Neurological): Dermatomal pain pattern Palpation: No palpable anomalies       Provocative Tests: Hyperextension/rotation test: deferred today       Lumbar quadrant test (Kemp's test): (+) on the left for foraminal stenosis  Gait & Posture Assessment  Ambulation: Unassisted Gait: Relatively normal for age and body habitus Posture: WNL  Lower Extremity Exam    Side: Right lower extremity  Side: Left lower extremity  Stability: No instability observed          Stability: No instability observed          Skin & Extremity Inspection: Skin color, temperature, and hair growth are WNL. No peripheral edema or cyanosis. No masses, redness, swelling, asymmetry, or associated skin lesions. No contractures.  Skin & Extremity Inspection: Skin color, temperature, and hair growth are WNL. No peripheral edema or cyanosis. No masses, redness, swelling, asymmetry, or associated skin lesions. No contractures.  Functional ROM: Unrestricted ROM                  Functional ROM: Unrestricted ROM                  Muscle Tone/Strength: Functionally  intact. No obvious neuro-muscular anomalies detected.  Muscle Tone/Strength: Functionally intact. No obvious neuro-muscular anomalies detected.  Sensory (Neurological): Unimpaired        Sensory (Neurological): Unimpaired        DTR: Patellar: deferred today Achilles: deferred today Plantar: deferred today  DTR: Patellar: deferred today Achilles: deferred today Plantar: deferred today  Palpation: No palpable anomalies  Palpation: No palpable anomalies    Assessment   Diagnosis  1. Chronic radicular lumbar pain   2. Lumbar radiculopathy   3. Foraminal stenosis of lumbar region (left L4)      Updated Problems: No problems updated.  Plan of Care  Problem-specific:  Assessment and Plan    Lumbar radiculopathy   Persistent lumbar radiculopathy with pain radiating down the right leg, occasionally shifting to the left. A previous transforaminal epidural steroid injection at L4 did not provide relief. Dizziness and epistaxis occurred post-injection, but  no over-the-counter medications were taken. Perform an epidural steroid injection using a central interlaminar approach to increase medication volume to 6-7 cc. Administer the same medication as the previous injection but in a higher volume. Provide sedation with Valium  during the procedure. Adjust needle direction based on the side of leg pain on the day of the procedure. Consider surgical intervention if the IL ESI does not provide relief.  Obesity   Obesity is contributing to lumbar radiculopathy. Continue weight loss efforts to aid in spinal decompression.       [name] has a current medication list which includes the following long-term medication(s): gabapentin , hydrochlorothiazide , omeprazole , and spironolactone .  Pharmacotherapy (Medications Ordered): No orders of the defined types were placed in this encounter.  Orders:  Orders Placed This Encounter  Procedures   Lumbar Epidural Injection    Standing  Status:   Future    Expected Date:   02/03/2024    Expiration Date:   01/20/2025    Scheduling Instructions:     Procedure: Interlaminar Lumbar Epidural Steroid injection (LESI)            Laterality: LEFt L4/5     Sedation: PO Valium      Timeframe: As soon as schedule allows.    Where will this procedure be performed?:   [facility]    Return in about 13 days (around 02/03/2024) for Left L4/5 ESI, in clinic (PO Valium  5mg ).    Recent Visits Date Type Provider Dept  12/30/23 Procedure visit Marcelino Nurse, MD [facility]  12/24/23 Office Visit Marcelino Nurse, MD [facility]  Showing recent visits within past 90 days and meeting all other requirements Today's Visits Date Type Provider Dept  01/21/24 Office Visit Marcelino Nurse, MD [facility]  Showing today's visits and meeting all other requirements Future Appointments No visits were found meeting these conditions. Showing future appointments within next 90 days and meeting all other requirements I personally spent a total of 30 minutes in the care of the patient today including preparing to see the patient, getting/reviewing separately obtained history, performing a medically appropriate exam/evaluation, counseling and educating, placing orders, and documenting clinical information in the EHR.   Note by: Nurse Marcelino, MD (TTS and AI technology used. I apologize for any typographical errors that were not detected and corrected.) Date: 01/21/2024; Time: 10:32 AM

## 2024-01-21 NOTE — Patient Instructions (Addendum)
 Ok for work note up until [employer] + 3 days Bring driver to procedure appointment.    ______________________________________________________________________    General Risks and Possible Complications  Patient Responsibilities: It is important that you read this as it is part of your informed consent. It is our duty to inform you of the risks and possible complications associated with treatments offered to you. It is your responsibility as a patient to read this and to ask questions about anything that is not clear or that you believe was not covered in this document.  Patient's Rights: You have the right to refuse treatment. You also have the right to change your mind, even after initially having agreed to have the treatment done. However, under this last option, if you wait until the last second to change your mind, you may be charged for the materials used up to that point.  Introduction: Medicine is not an Visual merchandiser. Everything in Medicine, including the lack of treatment(s), carries the potential for danger, harm, or loss (which is by definition: Risk). In Medicine, a complication is a secondary problem, condition, or disease that can aggravate an already existing one. All treatments carry the risk of possible complications. The fact that a side effects or complications occurs, does not imply that the treatment was conducted incorrectly. It must be clearly understood that these can happen even when everything is done following the highest safety standards.  No treatment: You can choose not to proceed with the proposed treatment alternative. The "PRO(s)" would include: avoiding the risk of complications associated with the therapy. The "CON(s)" would include: not getting any of the treatment benefits. These benefits fall under one of three categories: diagnostic; therapeutic; and/or palliative. Diagnostic benefits include: getting information which can ultimately lead to improvement of the disease  or symptom(s). Therapeutic benefits are those associated with the successful treatment of the disease. Finally, palliative benefits are those related to the decrease of the primary symptoms, without necessarily curing the condition (example: decreasing the pain from a flare-up of a chronic condition, such as incurable terminal cancer).  General Risks and Complications: These are associated to most interventional treatments. They can occur alone, or in combination. They fall under one of the following six (6) categories: no benefit or worsening of symptoms; bleeding; infection; nerve damage; allergic reactions; and/or death. No benefits or worsening of symptoms: In Medicine there are no guarantees, only probabilities. No healthcare provider can ever guarantee that a medical treatment will work, they can only state the probability that it may. Furthermore, there is always the possibility that the condition may worsen, either directly, or indirectly, as a consequence of the treatment. Bleeding: This is more common if the patient is taking a blood thinner, either prescription or over the counter (example: Goody Powders, Fish oil, Aspirin, Garlic, etc.), or if suffering a condition associated with impaired coagulation (example: Hemophilia, cirrhosis of the liver, low platelet counts, etc.). However, even if you do not have one on these, it can still happen. If you have any of these conditions, or take one of these drugs, make sure to notify your treating physician. Infection: This is more common in patients with a compromised immune system, either due to disease (example: diabetes, cancer, human immunodeficiency virus [HIV], etc.), or due to medications or treatments (example: therapies used to treat cancer and rheumatological diseases). However, even if you do not have one on these, it can still happen. If you have any of these conditions, or take one of these  drugs, make sure to notify your treating  physician. Nerve Damage: This is more common when the treatment is an invasive one, but it can also happen with the use of medications, such as those used in the treatment of cancer. The damage can occur to small secondary nerves, or to large primary ones, such as those in the spinal cord and brain. This damage may be temporary or permanent and it may lead to impairments that can range from temporary numbness to permanent paralysis and/or brain death. Allergic Reactions: Any time a substance or material comes in contact with our body, there is the possibility of an allergic reaction. These can range from a mild skin rash (contact dermatitis) to a severe systemic reaction (anaphylactic reaction), which can result in death. Death: In general, any medical intervention can result in death, most of the time due to an unforeseen complication. ______________________________________________________________________      ______________________________________________________________________    Preparing for your procedure  Appointments: If you think you may not be able to keep your appointment, call 24-48 hours in advance to cancel. We need time to make it available to others.  Procedure visits are for procedures only. During your procedure appointment there will be: NO Prescription Refills*. NO medication changes or discussions*. NO discussion of disability issues*. NO unrelated pain problem evaluations*. NO evaluations to order other pain procedures*. *These will be addressed at a separate and distinct evaluation encounter on the provider's evaluation schedule and not during procedure days.  Instructions: Food intake: Avoid eating anything solid for at least 8 hours prior to your procedure. Clear liquid intake: You may take clear liquids such as water up to 2 hours prior to your procedure. (No carbonated drinks. No soda.) Transportation: Unless otherwise stated by your physician, bring a driver.  (Driver cannot be a Market researcher, Pharmacist, community, or any other form of public transportation.) Morning Medicines: Except for blood thinners, take all of your other morning medications with a sip of water. Make sure to take your heart and blood pressure medicines. If your blood pressure's lower number is above 100, the case will be rescheduled. Blood thinners: Make sure to stop your blood thinners as instructed.  If you take a blood thinner, but were not instructed to stop it, call our office [phone] and ask to talk to a nurse. Not stopping a blood thinner prior to certain procedures could lead to serious complications. Diabetics on insulin: Notify the staff so that you can be scheduled 1st case in the morning. If your diabetes requires high dose insulin, take only  of your normal insulin dose the morning of the procedure and notify the staff that you have done so. Preventing infections: Shower with an antibacterial soap the morning of your procedure.  Build-up your immune system: Take 1000 mg of Vitamin C with every meal (3 times a day) the day prior to your procedure. Antibiotics: Inform the nursing staff if you are taking any antibiotics or if you have any conditions that may require antibiotics prior to procedures. (Example: recent joint implants)   Pregnancy: If you are pregnant make sure to notify the nursing staff. Not doing so may result in injury to the fetus, including death.  Sickness: If you have a cold, fever, or any active infections, call and cancel or reschedule your procedure. Receiving steroids while having an infection may result in complications. Arrival: You must be in the facility at least 30 minutes prior to your scheduled procedure. Tardiness: Your scheduled time is also  the cutoff time. If you do not arrive at least 15 minutes prior to your procedure, you will be rescheduled.  Children: Do not bring any children with you. Make arrangements to keep them home. Dress appropriately: There is  always a possibility that your clothing may get soiled. Avoid long dresses. Valuables: Do not bring any jewelry or valuables.  Reasons to call and reschedule or cancel your procedure: (Following these recommendations will minimize the risk of a serious complication.) Surgeries: Avoid having procedures within 2 weeks of any surgery. (Avoid for 2 weeks before or after any surgery). Flu Shots: Avoid having procedures within 2 weeks of a flu shots or . (Avoid for 2 weeks before or after immunizations). Barium: Avoid having a procedure within 7-10 days after having had a radiological study involving the use of radiological contrast. (Myelograms, Barium swallow or enema study). Heart attacks: Avoid any elective procedures or surgeries for the initial 6 months after a Myocardial Infarction (Heart Attack). Blood thinners: It is imperative that you stop these medications before procedures. Let us  know if you if you take any blood thinner.  Infection: Avoid procedures during or within two weeks of an infection (including chest colds or gastrointestinal problems). Symptoms associated with infections include: Localized redness, fever, chills, night sweats or profuse sweating, burning sensation when voiding, cough, congestion, stuffiness, runny nose, sore throat, diarrhea, nausea, vomiting, cold or Flu symptoms, recent or current infections. It is specially important if the infection is over the area that we intend to treat. Heart and lung problems: Symptoms that may suggest an active cardiopulmonary problem include: cough, chest pain, breathing difficulties or shortness of breath, dizziness, ankle swelling, uncontrolled high or unusually low blood pressure, and/or palpitations. If you are experiencing any of these symptoms, cancel your procedure and contact your primary care physician for an evaluation.  Remember:  Regular Business hours are:  Monday to Thursday 8:00 AM to 4:00 PM  Provider's  Schedule: Eric Como, MD:  Procedure days: Tuesday and Thursday 7:30 AM to 4:00 PM  Wallie Sherry, MD:  Procedure days: Monday and Wednesday 7:30 AM to 4:00 PM Last  Updated: 03/12/2023 ______________________________________________________________________    Epidural Steroid Injection Patient Information  Description: The epidural space surrounds the nerves as they exit the spinal cord.  In some patients, the nerves can be compressed and inflamed by a bulging disc or a tight spinal canal (spinal stenosis).  By injecting steroids into the epidural space, we can bring irritated nerves into direct contact with a potentially helpful medication.  These steroids act directly on the irritated nerves and can reduce swelling and inflammation which often leads to decreased pain.  Epidural steroids may be injected anywhere along the spine and from the neck to the low back depending upon the location of your pain.   After numbing the skin with local anesthetic (like Novocaine), a small needle is passed into the epidural space slowly.  You may experience a sensation of pressure while this is being done.  The entire block usually last less than 10 minutes.  Conditions which may be treated by epidural steroids:  Low back and leg pain Neck and arm pain Spinal stenosis Post-laminectomy syndrome Herpes zoster (shingles) pain Pain from compression fractures  Preparation for the injection:  Do not eat any solid food or dairy products within 8 hours of your appointment.  You may drink clear liquids up to 3 hours before appointment.  Clear liquids include water, black coffee, juice or soda.  No milk  or cream please. You may take your regular medication, including pain medications, with a sip of water before your appointment  Diabetics should hold regular insulin (if taken separately) and take 1/2 normal NPH dos the morning of the procedure.  Carry some sugar containing items with you to your  appointment. A driver must accompany you and be prepared to drive you home after your procedure.  Bring all your current medications with your. An IV may be inserted and sedation may be given at the discretion of the physician.   A blood pressure cuff, EKG and other monitors will often be applied during the procedure.  Some patients may need to have extra oxygen administered for a short period. You will be asked to provide medical information, including your allergies, prior to the procedure.  We must know immediately if you are taking blood thinners (like Coumadin/Warfarin)  Or if you are allergic to IV iodine contrast (dye). We must know if you could possible be pregnant.  Possible side-effects: Bleeding from needle site Infection (rare, may require surgery) Nerve injury (rare) Numbness & tingling (temporary) Difficulty urinating (rare, temporary) Spinal headache ( a headache worse with upright posture) Light -headedness (temporary) Pain at injection site (several days) Decreased blood pressure (temporary) Weakness in arm/leg (temporary) Pressure sensation in back/neck (temporary)  Call if you experience: Fever/chills associated with headache or increased back/neck pain. Headache worsened by an upright position. New onset weakness or numbness of an extremity below the injection site Hives or difficulty breathing (go to the emergency room) Inflammation or drainage at the infection site Severe back/neck pain Any new symptoms which are concerning to you  Please note:  Although the local anesthetic injected can often make your back or neck feel good for several hours after the injection, the pain will likely return.  It takes 3-7 days for steroids to work in the epidural space.  You may not notice any pain relief for at least that one week.  If effective, we will often do a series of three injections spaced 3-6 weeks apart to maximally decrease your pain.  After the initial series, we  generally will wait several months before considering a repeat injection of the same type.  If you have any questions, please call [phone] [facility]

## 2024-01-22 NOTE — Telephone Encounter (Signed)
 Matter addressed

## 2024-01-27 ENCOUNTER — Other Ambulatory Visit: Payer: Self-pay | Admitting: Nurse Practitioner

## 2024-01-27 ENCOUNTER — Other Ambulatory Visit: Payer: Self-pay | Admitting: *Deleted

## 2024-01-27 DIAGNOSIS — G8929 Other chronic pain: Secondary | ICD-10-CM

## 2024-01-27 DIAGNOSIS — M48061 Spinal stenosis, lumbar region without neurogenic claudication: Secondary | ICD-10-CM

## 2024-01-27 DIAGNOSIS — M5416 Radiculopathy, lumbar region: Secondary | ICD-10-CM

## 2024-01-27 MED ORDER — CELECOXIB 100 MG PO CAPS: 100.0000 mg | ORAL_CAPSULE | Freq: Two times a day (BID) | ORAL | 0 refills | Status: DC

## 2024-01-27 NOTE — Progress Notes (Signed)
 Safety precautions to be maintained throughout the outpatient stay will include: orient to surroundings, keep bed in low position, maintain call bell within reach at all times, provide assistance with transfer out of bed and ambulation.

## 2024-02-03 ENCOUNTER — Encounter: Payer: Self-pay | Admitting: Student in an Organized Health Care Education/Training Program

## 2024-02-03 ENCOUNTER — Ambulatory Visit
Admission: RE | Admit: 2024-02-03 | Discharge: 2024-02-03 | Disposition: A | Discharge status: Home / Self Care | Source: Ambulatory Visit | Attending: Student in an Organized Health Care Education/Training Program | Admitting: Student in an Organized Health Care Education/Training Program

## 2024-02-03 ENCOUNTER — Ambulatory Visit (HOSPITAL_BASED_OUTPATIENT_CLINIC_OR_DEPARTMENT_OTHER): Admitting: Student in an Organized Health Care Education/Training Program

## 2024-02-03 VITALS — BP 125/88 | HR 105 | Temp 98.1°F | Resp 17 | Ht 65.0 in | Wt 226.0 lb

## 2024-02-03 DIAGNOSIS — M48061 Spinal stenosis, lumbar region without neurogenic claudication: Secondary | ICD-10-CM | POA: Insufficient documentation

## 2024-02-03 DIAGNOSIS — G8929 Other chronic pain: Secondary | ICD-10-CM | POA: Insufficient documentation

## 2024-02-03 DIAGNOSIS — M5416 Radiculopathy, lumbar region: Secondary | ICD-10-CM | POA: Insufficient documentation

## 2024-02-03 MED ORDER — DEXAMETHASONE SOD PHOSPHATE PF 10 MG/ML IJ SOLN
10.0000 mg | Freq: Once | INTRAMUSCULAR | Status: AC
  Administered 2024-02-03: 10 mg

## 2024-02-03 MED ORDER — DIAZEPAM 5 MG PO TABS
5.0000 mg | ORAL_TABLET | ORAL | Status: AC
  Administered 2024-02-03: 5 mg via ORAL

## 2024-02-03 MED ORDER — DIAZEPAM 5 MG PO TABS
ORAL_TABLET | ORAL | Status: AC
  Filled 2024-02-03: qty 1

## 2024-02-03 MED ORDER — ROPIVACAINE HCL 2 MG/ML IJ SOLN
2.0000 mL | Freq: Once | INTRAMUSCULAR | Status: AC
  Administered 2024-02-03: 2 mL via EPIDURAL
  Filled 2024-02-03: qty 20

## 2024-02-03 MED ORDER — LIDOCAINE HCL 2 % IJ SOLN
20.0000 mL | Freq: Once | INTRAMUSCULAR | Status: AC
  Administered 2024-02-03: 100 mg
  Filled 2024-02-03: qty 40

## 2024-02-03 MED ORDER — SODIUM CHLORIDE 0.9% FLUSH
2.0000 mL | Freq: Once | INTRAVENOUS | Status: AC
  Administered 2024-02-03: 2 mL

## 2024-02-03 MED ORDER — IOHEXOL 180 MG/ML  SOLN
10.0000 mL | Freq: Once | INTRAMUSCULAR | Status: AC
  Administered 2024-02-03: 10 mL via EPIDURAL
  Filled 2024-02-03: qty 20

## 2024-02-03 NOTE — Progress Notes (Signed)
 PROVIDER NOTE: Interpretation of information contained herein should be left to medically-trained personnel. Specific patient instructions are provided elsewhere under Patient Instructions section of medical record. This document was created in part using STT-dictation technology, any transcriptional errors that may result from this process are unintentional.  Patient: Erika Larson Type: Established DOB: 1970/04/04 MRN: [id number] PCP: Mercer Clotilda SAUNDERS, MD  Service: Procedure DOS: 02/03/2024 Setting: Ambulatory Location: Ambulatory outpatient facility Delivery: Face-to-face Provider: Wallie Sherry, MD Specialty: Interventional Pain Management Specialty designation: 09 Location: Outpatient facility Ref. Prov.: Mercer Clotilda SAUNDERS, MD       Interventional Therapy   Type: Lumbar epidural steroid injection (LESI) (interlaminar) #1    Laterality: Left   Level:  L4-5 Level.  Imaging: Fluoroscopic guidance         Anesthesia: Local anesthesia (1-2% Lidocaine ) Sedation: Minimal Sedation                       DOS: 02/03/2024  Performed by: Wallie Sherry, MD  Purpose: Diagnostic/Therapeutic Indications: Lumbar radicular pain of intraspinal etiology of more than 4 weeks that has failed to respond to conservative therapy and is severe enough to impact quality of life or function. 1. Chronic radicular lumbar pain   2. Lumbar radiculopathy   3. Foraminal stenosis of lumbar region (left L4)    NAS-11 Pain score:   Pre-procedure: 6 /10   Post-procedure: 6 /10      Position / Prep / Materials:  Position: Prone w/ head of the table raised (slight reverse trendelenburg) to facilitate breathing.  Prep solution: ChloraPrep (2% chlorhexidine  gluconate and 70% isopropyl alcohol) Prep Area: Entire Posterior Lumbar Region from lower scapular tip down to mid buttocks area and from flank to flank. Materials:  Tray: Epidural tray Needle(s):  Type: Epidural needle (Tuohy) Gauge (G):   22 Length: Regular (3.5-in) Qty: 1  H&P (Pre-op Assessment):  Ms. Shroff is a 53 y.o. (year old), female patient, seen today for interventional treatment. She  has a past surgical history that includes Tubal ligation; Wisdom tooth extraction; Cholecystectomy; Robotic assisted laparoscopic hysterectomy and salpingectomy (Bilateral, 02/25/2019); and Cystoscopy (Bilateral, 02/25/2019). Ms. Leeth has a current medication list which includes the following prescription(s): aspirin ec, celecoxib , gabapentin , hydrochlorothiazide , iron (ferrous sulfate ), omeprazole , spironolactone , tizanidine , and vitamin d . Her primarily concern today is the Back Pain (LOW)  Initial Vital Signs:  Pulse/HCG Rate: 96  Temp: 98.1 F (36.7 C) Resp: 16 BP: (!) 152/100 (DENIES C/O DIZZINESS OR HEADACHES. pT STATES SHE FEELS LIKE HERSELF) SpO2: 98 %  BMI: Estimated body mass index is 37.61 kg/m as calculated from the following:   Height as of this encounter: 5' 5 (1.651 m).   Weight as of this encounter: 226 lb (102.5 kg).  Risk Assessment: Allergies: Reviewed. She is allergic to shellfish allergy and lisinopril .  Allergy Precautions: None required Coagulopathies: Reviewed. None identified.  Blood-thinner therapy: None at this time Active Infection(s): Reviewed. None identified. Ms. Grumbine is afebrile  Site Confirmation: Ms. Domanski was asked to confirm the procedure and laterality before marking the site Procedure checklist: Completed Consent: Before the procedure and under the influence of no sedative(s), amnesic(s), or anxiolytics, the patient was informed of the treatment options, risks and possible complications. To fulfill our ethical and legal obligations, as recommended by the American Medical Association's Code of Ethics, I have informed the patient of my clinical impression; the nature and purpose of the treatment or procedure; the risks, benefits, and possible complications of the intervention;  the  alternatives, including doing nothing; the risk(s) and benefit(s) of the alternative treatment(s) or procedure(s); and the risk(s) and benefit(s) of doing nothing. The patient was provided information about the general risks and possible complications associated with the procedure. These may include, but are not limited to: failure to achieve desired goals, infection, bleeding, organ or nerve damage, allergic reactions, paralysis, and death. In addition, the patient was informed of those risks and complications associated to Spine-related procedures, such as failure to decrease pain; infection (i.e.: Meningitis, epidural or intraspinal abscess); bleeding (i.e.: epidural hematoma, subarachnoid hemorrhage, or any other type of intraspinal or peri-dural bleeding); organ or nerve damage (i.e.: Any type of peripheral nerve, nerve root, or spinal cord injury) with subsequent damage to sensory, motor, and/or autonomic systems, resulting in permanent pain, numbness, and/or weakness of one or several areas of the body; allergic reactions; (i.e.: anaphylactic reaction); and/or death. Furthermore, the patient was informed of those risks and complications associated with the medications. These include, but are not limited to: allergic reactions (i.e.: anaphylactic or anaphylactoid reaction(s)); adrenal axis suppression; blood sugar elevation that in diabetics may result in ketoacidosis or comma; water retention that in patients with history of congestive heart failure may result in shortness of breath, pulmonary edema, and decompensation with resultant heart failure; weight gain; swelling or edema; medication-induced neural toxicity; particulate matter embolism and blood vessel occlusion with resultant organ, and/or nervous system infarction; and/or aseptic necrosis of one or more joints. Finally, the patient was informed that Medicine is not an exact science; therefore, there is also the possibility of unforeseen or  unpredictable risks and/or possible complications that may result in a catastrophic outcome. The patient indicated having understood very clearly. We have given the patient no guarantees and we have made no promises. Enough time was given to the patient to ask questions, all of which were answered to the patient's satisfaction. Ms. Venezia has indicated that she wanted to continue with the procedure. Attestation: I, the ordering provider, attest that I have discussed with the patient the benefits, risks, side-effects, alternatives, likelihood of achieving goals, and potential problems during recovery for the procedure that I have provided informed consent. Date  Time: 02/03/2024  1:22 PM  Pre-Procedure Preparation:  Monitoring: As per clinic protocol. Respiration, ETCO2, SpO2, BP, heart rate and rhythm monitor placed and checked for adequate function Safety Precautions: Patient was assessed for positional comfort and pressure points before starting the procedure. Time-out: I initiated and conducted the Time-out before starting the procedure, as per protocol. The patient was asked to participate by confirming the accuracy of the Time Out information. Verification of the correct person, site, and procedure were performed and confirmed by me, the nursing staff, and the patient. Time-out conducted as per Joint Commission's Universal Protocol (UP.01.01.01). Time: 1455 Start Time: 1455 hrs.  Description/Narrative of Procedure:          Target: Epidural space via interlaminar opening, initially targeting the lower laminar border of the superior vertebral body. Region: Lumbar Approach: Percutaneous paravertebral  Rationale (medical necessity): procedure needed and proper for the diagnosis and/or treatment of the patient's medical symptoms and needs. Procedural Technique Safety Precautions: Aspiration looking for blood return was conducted prior to all injections. At no point did we inject any  substances, as a needle was being advanced. No attempts were made at seeking any paresthesias. Safe injection practices and needle disposal techniques used. Medications properly checked for expiration dates. SDV (single dose vial) medications used. Description of the Procedure:  Protocol guidelines were followed. The procedure needle was introduced through the skin, ipsilateral to the reported pain, and advanced to the target area. Bone was contacted and the needle walked caudad, until the lamina was cleared. The epidural space was identified using "loss-of-resistance technique" with 2-3 ml of PF-NaCl (0.9% NSS), in a 5cc LOR glass syringe.  Vitals:   02/03/24 1422 02/03/24 1455 02/03/24 1501  BP: (!) 152/100 (!) 182/118 125/88  Pulse: 96 (!) 109 (!) 105  Resp: 16 17 17   Temp: 98.1 F (36.7 C)    SpO2: 98% 95% 97%  Weight: 226 lb (102.5 kg)    Height: 5' 5 (1.651 m)      Start Time: 1455 hrs. End Time: 1500 hrs.  Imaging Guidance (Spinal):          Type of Imaging Technique: Fluoroscopy Guidance (Spinal) Indication(s): Fluoroscopy guidance for needle placement to enhance accuracy in procedures requiring precise needle localization for targeted delivery of medication in or near specific anatomical locations not easily accessible without such real-time imaging assistance. Exposure Time: Please see nurses notes. Contrast: Before injecting any contrast, we confirmed that the patient did not have an allergy to iodine, shellfish, or radiological contrast. Once satisfactory needle placement was completed at the desired level, radiological contrast was injected. Contrast injected under live fluoroscopy. No contrast complications. See chart for type and volume of contrast used. Fluoroscopic Guidance: I was personally present during the use of fluoroscopy. Tunnel Vision Technique used to obtain the best possible view of the target area. Parallax error corrected before commencing the procedure.  Direction-depth-direction technique used to introduce the needle under continuous pulsed fluoroscopy. Once target was reached, antero-posterior, oblique, and lateral fluoroscopic projection used confirm needle placement in all planes. Images permanently stored in EMR. Interpretation: I personally interpreted the imaging intraoperatively. Adequate needle placement confirmed in multiple planes. Appropriate spread of contrast into desired area was observed. No evidence of afferent or efferent intravascular uptake. No intrathecal or subarachnoid spread observed. Permanent images saved into the patient's record.  Antibiotic Prophylaxis:   Anti-infectives (From admission, onward)    None      Indication(s): None identified  Post-operative Assessment:  Post-procedure Vital Signs:  Pulse/HCG Rate: (!) 105  Temp: 98.1 F (36.7 C) Resp: 17 BP: 125/88 SpO2: 97 %  EBL: None  Complications: No immediate post-treatment complications observed by team, or reported by patient.  Note: The patient tolerated the entire procedure well. A repeat set of vitals were taken after the procedure and the patient was kept under observation following institutional policy, for this type of procedure. Post-procedural neurological assessment was performed, showing return to baseline, prior to discharge. The patient was provided with post-procedure discharge instructions, including a section on how to identify potential problems. Should any problems arise concerning this procedure, the patient was given instructions to immediately contact us , at any time, without hesitation. In any case, we plan to contact the patient by telephone for a follow-up status report regarding this interventional procedure.  Comments:  No additional relevant information.  Plan of Care (POC)  Orders:  Orders Placed This Encounter  Procedures   DG PAIN CLINIC C-ARM 1-60 MIN NO REPORT    Intraoperative interpretation by procedural physician  at [facility].    Standing Status:   Standing    Number of Occurrences:   1    Reason for exam::   Assistance in needle guidance and placement for procedures requiring needle placement in or near specific anatomical locations  not easily accessible without such assistance.     Medications ordered for procedure: Meds ordered this encounter  Medications   iohexol (OMNIPAQUE) 180 MG/ML injection 10 mL    Must be Myelogram-compatible. If not available, you may substitute with a water-soluble, non-ionic, hypoallergenic, myelogram-compatible radiological contrast medium.   lidocaine  (XYLOCAINE ) 2 % (with pres) injection 400 mg   diazepam  (VALIUM ) tablet 5 mg    Make sure Flumazenil is available in the pyxis when using this medication. If oversedation occurs, administer 0.2 mg IV over 15 sec. If after 45 sec no response, administer 0.2 mg again over 1 min; may repeat at 1 min intervals; not to exceed 4 doses (1 mg)   ropivacaine  (PF) 2 mg/mL (0.2%) (NAROPIN ) injection 2 mL   sodium chloride  flush (NS) 0.9 % injection 2 mL   dexamethasone  (DECADRON ) injection 10 mg   Medications administered: We administered iohexol, lidocaine , diazepam , ropivacaine  (PF) 2 mg/mL (0.2%), sodium chloride  flush, and dexamethasone .  See the medical record for exact dosing, route, and time of administration.    Left L4 TF ESI 9/29- not helpful, left L4/5 IL [facility] 02/03/24    Follow-up plan:   No follow-ups on file.     Recent Visits Date Type Provider Dept  01/21/24 Office Visit Marcelino Nurse, MD [facility]  12/30/23 Procedure visit Marcelino Nurse, MD [facility]  12/24/23 Office Visit Marcelino Nurse, MD [facility]  Showing recent visits within past 90 days and meeting all other requirements Today's Visits Date Type Provider Dept  02/03/24 Procedure visit Marcelino Nurse, MD [facility]  Showing today's visits and meeting all other requirements Future  Appointments Date Type Provider Dept  02/24/24 Appointment Patel, Seema K, NP [facility]  Showing future appointments within next 90 days and meeting all other requirements   Disposition: Discharge home  Discharge (Date  Time): 02/03/2024; 1505 hrs.   Primary Care Physician: Mercer Clotilda SAUNDERS, MD Location: [facility] Note by: Nurse Marcelino, MD (TTS technology used. I apologize for any typographical errors that were not detected and corrected.) Date: 02/03/2024; Time: 3:32 PM  Disclaimer:  Medicine is not an visual merchandiser. The only guarantee in medicine is that nothing is guaranteed. It is important to note that the decision to proceed with this intervention was based on the information collected from the patient. The Data and conclusions were drawn from the patient's questionnaire, the interview, and the physical examination. Because the information was provided in large part by the patient, it cannot be guaranteed that it has not been purposely or unconsciously manipulated. Every effort has been made to obtain as much relevant data as possible for this evaluation. It is important to note that the conclusions that lead to this procedure are derived in large part from the available data. Always take into account that the treatment will also be dependent on availability of resources and existing treatment guidelines, considered by other Pain Management Practitioners as being common knowledge and practice, at the time of the intervention. For Medico-Legal purposes, it is also important to point out that variation in procedural techniques and pharmacological choices are the acceptable norm. The indications, contraindications, technique, and results of the above procedure should only be interpreted and judged by a Board-Certified Interventional Pain Specialist with extensive familiarity and expertise in the same exact procedure and technique.

## 2024-02-03 NOTE — Progress Notes (Signed)
 Safety precautions to be maintained throughout the outpatient stay will include: orient to surroundings, keep bed in low position, maintain call bell within reach at all times, provide assistance with transfer out of bed and ambulation.   Pt advised to report today's BP readings with PCP.  Signed note from Dr. Marcelino excusing pt from work until 03/02/2024 given to pt.

## 2024-02-04 ENCOUNTER — Ambulatory Visit: Payer: Self-pay

## 2024-02-04 ENCOUNTER — Telehealth: Payer: Self-pay | Admitting: *Deleted

## 2024-02-04 NOTE — Telephone Encounter (Signed)
 FYI Only or Action Required?: Action required by provider: clinical question for provider and update on patient condition.  Patient was last seen in primary care on 01/13/2024 by Mercer Clotilda SAUNDERS, MD.  Called Nurse Triage reporting Hypertension.  Symptoms began yesterday.  Interventions attempted: Prescription medications: as prescribed.  Symptoms are: rapidly improving.  Triage Disposition: See PCP Within 2 Weeks  Patient/caregiver understands and will follow disposition?: No, wishes to speak with PCP   Copied from CRM [id number]. Topic: Clinical - Medical Advice >> Feb 04, 2024  9:52 AM Robinson H wrote: Reason for CRM: Patients husband calling stating that patient went to her pain management appointment yesterday and blood pressure was fluctuating 152/100, 153/100 and can't remember what it was after injection. Curtistine states that pain management doctor wants Dr. Mercer to look at chart from yesterday at patients blood pressures and advise on next steps if any.  Curtistine [phone] Reason for Disposition  [1] Systolic BP >= 130 OR Diastolic >= 80 AND [2] taking BP medications  Answer Assessment - Initial Assessment Questions Additional info: On 02/03/24 she had pain management appointment when she was noted to have elevated blood pressures 150/100's pre-procedure. Blood pressure normalized post procedure 125/88, tachy 105. She was advised by pain management to alert pcp to elevated blood pressure for review and recommendation. Please follow up with spouse Curtistine [phone]   1. BLOOD PRESSURE: What is your blood pressure? Did you take at least two measurements 5 minutes apart?     Yesterday 152/100 before procedure, after procedure 125/88, 105. Has not measured today  2. ONSET: When did you take your blood pressure?     02/03/24 3. HOW: How did you take your blood pressure? (e.g., automatic home BP monitor, visiting nurse)     Pain management appointment  4. HISTORY: Do  you have a history of high blood pressure?     yes 5. MEDICINES: Are you taking any medicines for blood pressure? Have you missed any doses recently?     As prescribed  6. OTHER SYMPTOMS: Do you have any symptoms? (e.g., blurred vision, chest pain, difficulty breathing, headache, weakness)     Denies all symptoms. She feels her normal self.  Protocols used: Blood Pressure - High-A-AH

## 2024-02-04 NOTE — Telephone Encounter (Signed)
 Called for post procedure follow-up. Message left.

## 2024-02-04 NOTE — Telephone Encounter (Signed)
 Patient is sch for 11/7 and will bring bp measurements from home

## 2024-02-07 ENCOUNTER — Ambulatory Visit (INDEPENDENT_AMBULATORY_CARE_PROVIDER_SITE_OTHER): Admitting: Family Medicine

## 2024-02-07 VITALS — BP 124/71 | HR 98 | Temp 97.9°F | Ht 65.0 in | Wt 239.8 lb

## 2024-02-07 DIAGNOSIS — I10 Essential (primary) hypertension: Secondary | ICD-10-CM | POA: Diagnosis not present

## 2024-02-07 DIAGNOSIS — E66812 Obesity, class 2: Secondary | ICD-10-CM

## 2024-02-07 DIAGNOSIS — M5416 Radiculopathy, lumbar region: Secondary | ICD-10-CM | POA: Diagnosis not present

## 2024-02-07 DIAGNOSIS — F419 Anxiety disorder, unspecified: Secondary | ICD-10-CM

## 2024-02-07 DIAGNOSIS — Z6839 Body mass index (BMI) 39.0-39.9, adult: Secondary | ICD-10-CM

## 2024-02-07 NOTE — Patient Instructions (Addendum)
 Take hydrochlorothiazide  12.5 mg in the morning and spironolactone  25 mg in the afternoon.  Monitor your blood pressure and avoid salty foods.  For BP readings consistently greater than 140/90 (either number) notify clinic.  If this continues we can increase the dose of the medications.  If you feel like gabapentin  is not helping but making you drowsy you can stop the medication.  You can find Alli, an over the counter wt loss medication at your local drug store.  Behavioral Health Services: -to make an appointment contact the office/provider you are interested in seeing.  No referral is needed.  The below is not an all inclusive list, but will help you get started.  [email or web address] -counseling located off of [address]  [email or web address] -website helps you find providers in your area  [facility] -Located off of [address]. across from [place]  Dr. Sable is a Therapist, Sports with [facility] [phone]  [facility]  [facility]  -[address]  [phone] -a place in town that has counseling and Psychiatry services.    [facility] [address] [place], [place] [phone]  [facility] [address] [place], [place] [phone]  [facility] [address], [place] [address] [phone]  Dr. Rodgers Macintosh [facility] [address] [place], [place] [address] [phone]  Dr. Elna Lo [facility] [address] [place] [place], [place] [address] [phone]
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
## 2024-02-20 ENCOUNTER — Telehealth: Payer: Self-pay | Admitting: Orthopedic Surgery

## 2024-02-20 NOTE — Progress Notes (Signed)
 PROVIDER NOTE: Interpretation of information contained herein should be left to medically-trained personnel. Specific patient instructions are provided elsewhere under Patient Instructions section of medical record. This document was created in part using AI and STT-dictation technology, any transcriptional errors that may result from this process are unintentional.  Patient: Erika Larson  Service: E/M   PCP: Erika Clotilda SAUNDERS, MD  DOB: 16-Sep-1970  DOS: 02/24/2024  Provider: Emmy MARLA Blanch, NP  MRN: [id number]  Delivery: Face-to-face  Specialty: Interventional Pain Management  Type: Established Patient  Setting: Ambulatory outpatient facility  Specialty designation: 09  Referring Prov.: Erika Clotilda SAUNDERS, MD  Location: Outpatient office facility       History of present illness (HPI) [name], a 53 y.o. year old female, is here today because of her Chronic radicular lumbar pain [M54.16, G89.29]. [name]'s primary complain today is Back Pain (lower)  Pertinent problems: [name] has Arthritis; Cigarette nicotine dependence without complication; Status post hysterectomy; Obesity with body mass index 30 or greater; Lumbar radiculopathy; and Foraminal stenosis of lumbar region on their pertinent problem list.  Pain Assessment: Severity of Chronic pain is reported as a 10-Worst pain ever/10. Location: Back Lower/left leg to the knee, right leg to the shin. Onset: More than a month ago. Quality: Burning, Tightness. Timing: Constant. Modifying factor(s): nothing. Vitals:  height is 5' 5 (1.651 m) and weight is 239 lb (108.4 kg). Her temporal temperature is 97.8 F (36.6 C). Her blood pressure is 130/88 and her pulse is 90. Her respiration is 18 and oxygen saturation is 100%.  BMI: Estimated body mass index is 39.77 kg/m as calculated from the following:   Height as of this encounter: 5' 5 (1.651 m).   Weight as of this encounter: 239 lb (108.4 kg).  Last encounter:  Visit date not found. Last procedure: Visit date not found.  Reason for encounter: post-procedure evaluation and assessment.   Ms. Barreras received a lumbar epidural steroid injection (LESI) on February 03, 2024.  She reports initially 100% pain relief and functional improvement during local anesthetic phase, followed by sustain 50% pain relief and functional improvement since the procedure. She continues experiencing left side lower back pain, which is consistent with facet loading and degeneration at L3-L4, L4-L5, L5-S1.  Discussed to prescribe prednisone  taper and Celebrex  to reduce pain level and and inflammation.   Discussed the use of AI scribe software for clinical note transcription with the patient, who gave verbal consent to proceed.  History of Present Illness   Erika Larson is a 53 year old female who presents with persistent lumbar pain despite prior epidural injections.  She experiences persistent lumbar pain radiating to her legs, primarily affecting the left side. The pain extends from the lower back to the ankle on the left side and to the knee on the right side. Last week, the pain was severe, necessitating adjustments in her sleeping position by elevating her bed and using additional pillows. The pain has been present for the past week but subsided last night. Despite no pain today, she mentions difficulty standing for long periods, needing to sit while doing activities like washing dishes, and requiring assistance to walk down steps last week.  She received a lumbar epidural injection on November 3rd, which initially provided relief. However, the pain returned three days post-injection. Despite this, she reports no pain today. She experiences limitations in mobility, needing to stop multiple times when walking short distances and using a walker for support.  There is a significant decrease in her ability to walk long distances compared to before her back pain began.  She  has a history of high blood pressure and was previously on gabapentin , which was discontinued due to elevated blood pressure. Her current blood pressure is 130/88. She is also on blood pressure medications and was previously prescribed Celebrex , which she found helpful but has not been refilled recently.  She experiences numbness and weakness in her legs, particularly on the left side, and has difficulty standing for extended periods.     Procedure Type: Lumbar epidural steroid injection (LESI) (interlaminar) #1    Laterality: Left   Level:  L4-5 Level.  Imaging: Fluoroscopic guidance         Anesthesia: Local anesthesia (1-2% Lidocaine ) Sedation: Minimal Sedation                       DOS: 02/03/2024  Performed by: Wallie Sherry, MD   Purpose: Diagnostic/Therapeutic Indications: Lumbar radicular pain of intraspinal etiology of more than 4 weeks that has failed to respond to conservative therapy and is severe enough to impact quality of life or function. 1. Chronic radicular lumbar pain   2. Lumbar radiculopathy   3. Foraminal stenosis of lumbar region (left L4)     NAS-11 Pain score:        Pre-procedure: 6 /10        Post-procedure: 6 /10    Post-Procedure Evaluation    Effectiveness:  Initial hour after procedure: 100 % . Subsequent 4-6 hours post-procedure: 100 % . Analgesia past initial 6 hours: 50 % . Ongoing improvement:  Analgesic:  [name] received a lumbar epidural steroid injection (LESI) on February 03, 2024.  She reports initially 100% pain relief and functional improvement during local anesthetic phase, followed by sustain 50% pain relief and functional improvement since the procedure. She continues experiencing left side lower back pain, which is consistent with facet loading and degeneration at L3-L4, L4-L5, L5-S1.  Function: [name] reports improvement in function ROM: [name] reports improvement in ROM   Pharmacotherapy Assessment   Prednisone   taper Celebrex  20 mg 2 times daily as needed for pain. Monitoring: Mobile PMP: PDMP not reviewed this encounter.       Pharmacotherapy: No side-effects or adverse reactions reported. Compliance: No problems identified. Effectiveness: Clinically acceptable.  No notes on file  UDS:  No results found for: SUMMARY  No results found for: CBDTHCR No results found for: D8THCCBX No results found for: D9THCCBX  ROS  Constitutional: Denies any fever or chills Gastrointestinal: No reported hemesis, hematochezia, vomiting, or acute GI distress Musculoskeletal: low back pain Neurological: No reported episodes of acute onset apraxia, aphasia, dysarthria, agnosia, amnesia, paralysis, loss of coordination, or loss of consciousness  Medication Review  Iron (Ferrous Sulfate ), Vitamin D , aspirin EC, celecoxib , gabapentin , hydrochlorothiazide , predniSONE , spironolactone , and tiZANidine   History Review  Allergy: [name] is allergic to shellfish allergy and lisinopril . Drug: [name]  reports no history of drug use. Alcohol:  reports current alcohol use. Tobacco:  reports that she has been smoking cigarettes. She has never used smokeless tobacco. Social: [name]  reports that she has been smoking cigarettes. She has never used smokeless tobacco. She reports current alcohol use. She reports that she does not use drugs. Medical:  has a past medical history of Allergy, Anemia, Anxiety, Arthritis, Depression, Gallstones (1998), GERD (gastroesophageal reflux disease), Hyperlipidemia, Hypertension, and Seizures (HCC). Surgical: [name]  has a  past surgical history that includes Tubal ligation; Wisdom tooth extraction; Cholecystectomy; Robotic assisted laparoscopic hysterectomy and salpingectomy (Bilateral, 02/25/2019); and Cystoscopy (Bilateral, 02/25/2019). Family: family history includes Arthritis in her mother; Hearing loss in her mother; Hypertension in her brother, brother, brother,  brother, mother, sister, sister, sister, sister, and sister; Other in her daughter; Stomach cancer in her mother and sister.  Laboratory Chemistry Profile   Renal Lab Results  Component Value Date   BUN 19 04/25/2023   CREATININE 0.83 04/25/2023   BCR NOT APPLICABLE 12/16/2019   GFR 81.67 04/25/2023   GFRAA >60 12/16/2019   GFRNONAA >60 12/16/2019    Hepatic Lab Results  Component Value Date   AST 17 04/25/2023   ALT 16 04/25/2023   ALBUMIN 4.4 04/25/2023   ALKPHOS 76 04/25/2023   LIPASE 23 09/27/2016    Electrolytes Lab Results  Component Value Date   NA 140 04/25/2023   K 3.7 04/25/2023   CL 102 04/25/2023   CALCIUM 9.4 04/25/2023    Bone Lab Results  Component Value Date   VD25OH 43.69 04/23/2022    Inflammation (CRP: Acute Phase) (ESR: Chronic Phase) Lab Results  Component Value Date   CRP 2.6 06/09/2020         Note: Above Lab results reviewed.  Recent Imaging Review  DG PAIN CLINIC C-ARM 1-60 MIN NO REPORT Fluoro was used, but no Radiologist interpretation will be provided.  Please refer to NOTES tab for provider progress note. Note: Reviewed        Narrative & Impression  EXAM: 4 VIEW(S) XRAY OF THE LUMBAR SPINE 11/18/2023 03:42:45 PM   COMPARISON: None available.   CLINICAL HISTORY: Back pain. Arthritis. Patient states no injury, pain left lower back since April.   FINDINGS:   BONES: No acute fracture. No aggressive appearing osseous lesion. Alignment is normal. No abnormal motion on flexion or extension.   DISCS AND DEGENERATIVE CHANGES: Moderate and progressive disc space narrowing at L3-L4, L4-L5, and L5-S1 since 2021. Lower lumbar facet hypertrophy.   SOFT TISSUES: No acute abnormality.   IMPRESSION: 1.  Progressive degenerative disc disease at L3-L4, L4-L5, L5-S1. 2.  Lower lumbar facet hypertrophy.   Electronically signed by: Andrea Gasman MD 11/29/2023 10:21 PM EDT RP Workstation: [id number]    Physical Exam   Vitals: BP 130/88 (Cuff Size: Normal)   Pulse 90   Temp 97.8 F (36.6 C) (Temporal)   Resp 18   Ht 5' 5 (1.651 m)   Wt 239 lb (108.4 kg)   LMP 02/23/2019   SpO2 100%   BMI 39.77 kg/m  BMI: Estimated body mass index is 39.77 kg/m as calculated from the following:   Height as of this encounter: 5' 5 (1.651 m).   Weight as of this encounter: 239 lb (108.4 kg). Ideal: Ideal body weight: 57 kg (125 lb 10.6 oz) Adjusted ideal body weight: 77.6 kg (171 lb) General appearance: Well nourished, well developed, and well hydrated. In no apparent acute distress Mental status: Alert, oriented x 3 (person, place, & time)       Respiratory: No evidence of acute respiratory distress Eyes: PERLA  Musculoskeletal: +LBP (Facet loading) Lumbar Exam  Skin & Axial Inspection: No masses, redness, or swelling Alignment: Symmetrical Functional ROM: Pain restricted ROM       Stability: No instability detected Muscle Tone/Strength: Functionally intact. No obvious neuro-muscular anomalies detected. Sensory (Neurological): Musculoskeletal pain pattern Palpation: No palpable anomalies       Provocative Tests: Hyperextension/rotation test: (+) bilaterally  for facet joint pain. Lumbar quadrant test (Kemp's test): (+) bilaterally for facet joint pain. Lateral bending test: (+) due to pain.  Assessment   Diagnosis Status  1. Chronic radicular lumbar pain   2. Lumbar radiculopathy   3. Foraminal stenosis of lumbar region (left L4)   4. Arthritis   5. Chronic pain syndrome    Improving Improving Controlled   Updated Problems: No problems updated.  Plan of Care  Problem-specific:  Assessment and Plan    Left lumbar radiculopathy and spinal stenosis with facet arthropathy Chronic left lumbar radiculopathy and spinal stenosis with facet arthropathy. Recent lumbar epidural steroid injection at L4-L5 on the left side provided significant relief. X-ray shows lower lumbar facet hypertrophy and  degeneration at L3-L4, L4-L5, and L5-S1. Discussed potential for surgical intervention if epidural injections do not provide adequate relief or lumbar facet block (MBNB).  - Prescribed prednisone  20 mg taper for 9 days for inflammation and pain management. - Prescribed Celebrex  for pain management. - Advised to wait three months before considering repeat injections. - Discussed potential for lumbar facet block if facet joint pain persists. - Advised to consult with [facility] for surgical evaluation if symptoms do not improve.       [name] has a current medication list which includes the following long-term medication(s): hydrochlorothiazide , spironolactone , and gabapentin .  Pharmacotherapy (Medications Ordered): Meds ordered this encounter  Medications   predniSONE  (DELTASONE ) 20 MG tablet    Sig: Take 3 tablets (60 mg total) by mouth daily with breakfast for 3 days, THEN 2 tablets (40 mg total) daily with breakfast for 3 days, THEN 1 tablet (20 mg total) daily with breakfast for 3 days.    Dispense:  18 tablet    Refill:  0   celecoxib  (CELEBREX ) 100 MG capsule    Sig: Take 1 capsule (100 mg total) by mouth 2 (two) times daily.    Dispense:  60 capsule    Refill:  0   Orders:  No orders of the defined types were placed in this encounter.       Return for (PRN), eval by MD, (F2F).    Recent Visits Date Type Provider Dept  02/03/24 Procedure visit Marcelino Nurse, MD [facility]  01/21/24 Office Visit Marcelino Nurse, MD [facility]  12/30/23 Procedure visit Marcelino Nurse, MD [facility]  12/24/23 Office Visit Marcelino Nurse, MD [facility]  Showing recent visits within past 90 days and meeting all other requirements Today's Visits Date Type Provider Dept  02/24/24 Office Visit [place] Foerster K, NP [facility]  Showing today's visits and meeting all other requirements Future Appointments Date Type Provider  Dept  03/31/24 Appointment Marcelino Nurse, MD [facility]  Showing future appointments within next 90 days and meeting all other requirements  I discussed the assessment and treatment plan with the patient. The patient was provided an opportunity to ask questions and all were answered. The patient agreed with the plan and demonstrated an understanding of the instructions.  Patient advised to call back or seek an in-person evaluation if the symptoms or condition worsens.  I personally spent a total of 30 minutes in the care of the patient today including preparing to see the patient, getting/reviewing separately obtained history, performing a medically appropriate exam/evaluation, counseling and educating, placing orders, referring and communicating with other health care professionals, documenting clinical information in the EHR, independently interpreting results, communicating results, and coordinating care.  Note by: Betty Daidone K Keymari Sato, NP (TTS and AI technology used. I apologize for any typographical errors that were not detected and corrected.) Date: 02/24/2024; Time: 1:07 PM

## 2024-02-20 NOTE — Telephone Encounter (Addendum)
 The patient's husband would like to know if they can request an extension for the handicap placard. (

## 2024-02-20 NOTE — Telephone Encounter (Signed)
 Placed in stacy by accident. Pt is being seen by Lyle.

## 2024-02-20 NOTE — Telephone Encounter (Signed)
 Handicap form filled out and ready for pick up. Mr Erika Larson advised.

## 2024-02-23 ENCOUNTER — Other Ambulatory Visit: Payer: Self-pay | Admitting: Physician Assistant

## 2024-02-24 ENCOUNTER — Encounter: Payer: Self-pay | Admitting: Nurse Practitioner

## 2024-02-24 ENCOUNTER — Ambulatory Visit: Attending: Nurse Practitioner | Admitting: Nurse Practitioner

## 2024-02-24 VITALS — BP 130/88 | HR 90 | Temp 97.8°F | Resp 18 | Ht 65.0 in | Wt 239.0 lb

## 2024-02-24 DIAGNOSIS — M48061 Spinal stenosis, lumbar region without neurogenic claudication: Secondary | ICD-10-CM | POA: Diagnosis not present

## 2024-02-24 DIAGNOSIS — M5416 Radiculopathy, lumbar region: Secondary | ICD-10-CM | POA: Insufficient documentation

## 2024-02-24 DIAGNOSIS — M1990 Unspecified osteoarthritis, unspecified site: Secondary | ICD-10-CM | POA: Insufficient documentation

## 2024-02-24 DIAGNOSIS — G894 Chronic pain syndrome: Secondary | ICD-10-CM | POA: Insufficient documentation

## 2024-02-24 DIAGNOSIS — G8929 Other chronic pain: Secondary | ICD-10-CM | POA: Insufficient documentation

## 2024-02-24 MED ORDER — PREDNISONE 20 MG PO TABS: ORAL_TABLET | ORAL | 0 refills | Status: AC

## 2024-02-24 MED ORDER — CELECOXIB 100 MG PO CAPS
100.0000 mg | ORAL_CAPSULE | Freq: Two times a day (BID) | ORAL | 0 refills | Status: AC
Start: 2024-02-24 — End: 2024-03-25

## 2024-03-03 ENCOUNTER — Ambulatory Visit: Admitting: Neurology

## 2024-03-03 ENCOUNTER — Ambulatory Visit: Payer: Self-pay | Admitting: Neurosurgery

## 2024-03-03 DIAGNOSIS — R202 Paresthesia of skin: Secondary | ICD-10-CM | POA: Diagnosis not present

## 2024-03-03 NOTE — Procedures (Signed)
 [facility]  [address] [place], [address]  [place], [place] [address] Tel: [phone] Fax: [phone] Test Date:  03/03/2024  Patient: Erika Larson DOB: February 07, 1971 Physician: Venetia Potters, MD  Sex: Female Height: 5' 5 Ref Phys: Penne Sharps, MD  ID#: [id number]   Technician:    History: This is a 53 year old female with low back pain radiating into her legs.  NCV & EMG Findings: Extensive electrodiagnostic evaluation of bilateral lower limbs shows: Bilateral sural and superficial peroneal/fibular sensory responses are within normal limits. Bilateral peroneal/fibular (EDB) and tibial (AH) motor responses are within normal limits. Bilateral H reflex latency is within normal limits. There is no evidence of active or chronic motor axon loss changes affecting any of the tested muscles. Motor unit configuration and recruitment pattern is within normal limits.  Impression: This is a normal study of bilateral lower limbs. In particular, there is no electrodiagnostic evidence of right or left lumbosacral (L3-S1) radiculopathy, large fiber sensorimotor neuropathy, or myopathy.    ___________________________ Venetia Potters, MD    Nerve Conduction Studies Motor Nerve Results    Latency Amplitude F-Lat Segment Distance CV Comment  Site (ms) Norm (mV) Norm (ms)  (cm) (m/s) Norm   Left Fibular (EDB) Motor  Ankle 3.4  < 6.0 11.9  > 2.5        Bel fib head 9.1 - 11.7 -  Bel fib head-Ankle 30 53  > 40   Pop fossa 10.6 - 11.4 -  Pop fossa-Bel fib head 9 60 -   Right Fibular (EDB) Motor  Ankle 3.6  < 6.0 11.7  > 2.5        Bel fib head 8.6 - 10.9 -  Bel fib head-Ankle 29 58  > 40   Pop fossa 10.1 - 10.3 -  Pop fossa-Bel fib head 9 60 -   Left Tibial (AH) Motor  Ankle 3.8  < 6.0 15.8  > 4.0        Knee 11.3 - 12.4 -  Knee-Ankle 37 49  > 40   Right Tibial (AH) Motor  Ankle 3.7  < 6.0 12.1  > 4.0        Knee 11.5 - 8.9 -  Knee-Ankle 41 53  > 40    Sensory Sites    Neg  Peak Lat Amplitude (O-P) Segment Distance Velocity Comment  Site (ms) Norm (V) Norm  (cm) (ms)   Left Superficial Fibular Sensory  14 cm-Ankle 2.8  < 4.6 16  > 4 14 cm-Ankle 14    Right Superficial Fibular Sensory  14 cm-Ankle 2.5  < 4.6 13  > 4 14 cm-Ankle 14    Left Sural Sensory  Calf-Lat mall 3.3  < 4.6 7  > 4 Calf-Lat mall 14    Right Sural Sensory  Calf-Lat mall 3.1  < 4.6 6  > 4 Calf-Lat mall 14     H-Reflex Results    M-Lat H Lat H Neg Amp H-M Lat  Site (ms) (ms) Norm (mV) (ms)  Left Tibial H-Reflex  Pop fossa 4.8 31.8  < 35.0 3.7 27.0  Right Tibial H-Reflex  Pop fossa 3.6 33.1  < 35.0 2.8 29.5   Electromyography   Side Muscle Ins.Act Fibs Fasc Recrt Amp Dur Poly Activation Comment  Left Tib ant Nml Nml Nml Nml Nml Nml Nml Nml N/A  Left Gastroc MH Nml Nml Nml Nml Nml Nml Nml Nml N/A  Left Vastus lat Nml Nml Nml Nml Nml  Nml Nml Nml N/A  Left Biceps fem SH Nml Nml Nml Nml Nml Nml Nml Nml N/A  Left Gluteus med Nml Nml Nml Nml Nml Nml Nml Nml N/A  Right Tib ant Nml Nml Nml Nml Nml Nml Nml Nml N/A  Right Gastroc MH Nml Nml Nml Nml Nml Nml Nml Nml N/A  Right Vastus lat Nml Nml Nml Nml Nml Nml Nml Nml N/A  Right Biceps fem SH Nml Nml Nml Nml Nml Nml Nml Nml N/A  Right Gluteus med Nml Nml Nml Nml Nml Nml Nml Nml N/A      Waveforms:  Motor           Sensory           H-Reflex

## 2024-03-04 ENCOUNTER — Other Ambulatory Visit: Payer: Self-pay | Admitting: Nurse Practitioner

## 2024-03-04 DIAGNOSIS — M48061 Spinal stenosis, lumbar region without neurogenic claudication: Secondary | ICD-10-CM

## 2024-03-04 DIAGNOSIS — G8929 Other chronic pain: Secondary | ICD-10-CM

## 2024-03-04 DIAGNOSIS — M5416 Radiculopathy, lumbar region: Secondary | ICD-10-CM

## 2024-03-12 ENCOUNTER — Other Ambulatory Visit: Payer: Self-pay | Admitting: Nurse Practitioner

## 2024-03-12 DIAGNOSIS — G8929 Other chronic pain: Secondary | ICD-10-CM

## 2024-03-12 DIAGNOSIS — M5416 Radiculopathy, lumbar region: Secondary | ICD-10-CM

## 2024-03-12 DIAGNOSIS — M48061 Spinal stenosis, lumbar region without neurogenic claudication: Secondary | ICD-10-CM

## 2024-03-13 ENCOUNTER — Ambulatory Visit (INDEPENDENT_AMBULATORY_CARE_PROVIDER_SITE_OTHER): Admitting: Family Medicine

## 2024-03-13 ENCOUNTER — Encounter: Payer: Self-pay | Admitting: Family Medicine

## 2024-03-13 VITALS — BP 124/88 | HR 105 | Temp 98.8°F | Ht 65.0 in | Wt 235.6 lb

## 2024-03-13 DIAGNOSIS — E66812 Obesity, class 2: Secondary | ICD-10-CM

## 2024-03-13 DIAGNOSIS — Z6839 Body mass index (BMI) 39.0-39.9, adult: Secondary | ICD-10-CM

## 2024-03-13 DIAGNOSIS — I10 Essential (primary) hypertension: Secondary | ICD-10-CM

## 2024-03-13 DIAGNOSIS — F419 Anxiety disorder, unspecified: Secondary | ICD-10-CM

## 2024-03-13 NOTE — Patient Instructions (Signed)
 Behavioral Health Services: -to make an appointment contact the office/provider you are interested in seeing.  No referral is needed.  The below is not an all inclusive list, but will help you get started.  [email or web address] -counseling located off of [address]  [email or web address] -website helps you find providers in your area  [facility] -Located off of [address]. across from [place]  Dr. Jannifer Franklin is a Therapist, sports with [facility]. [phone]  [facility]  [facility]  -[address]  [phone] -a place in town that has counseling and Psychiatry services.    [facility] [address] [place], [place] [phone]  [facility] [address], [address] [place], [place] [phone]  [facility] [address], [place] [address] [phone]  Dr. Alanson Aly [facility] [address] [place], [place] [address] [phone]  Dr. Archer Asa [facility] [address] [place] [place], [place] [address] [phone]

## 2024-03-13 NOTE — Progress Notes (Signed)
 "  Established Patient Office Visit   Subjective  Patient ID: Erika Larson, female    DOB: Jun 06, 1970  Age: 53 y.o. MRN: [id number]  Chief Complaint  Patient presents with   Medical Management of Chronic Issues    Patient came in for a 5 month follow-up for blood pressure and Anxiety     Pt is a 53 yo female seen for f/u on anxiety and HTN.  BP improving.  Taking HCTZ 12.5 mg in a.m. and spironolactone  25 mg in p.m.  Patient finds herself pacing.  Trying to keep busy in order to feel less anxious.  Watching her 52-year-old grandson.  Currently without back pain on prednisone .  Endorses right ankle edema.  Pt had EMG/NCS, states study was negative.  PT previously stopped as it made symptoms worse.  Has follow-up with neurosurgery scheduled.    Patient Active Problem List   Diagnosis Date Noted   Lumbar radiculopathy 12/24/2023   Foraminal stenosis of lumbar region 12/24/2023   Status post hysterectomy 02/25/2019   Essential hypertension 04/27/2018   Arthritis 04/27/2018   Cigarette nicotine dependence without complication 04/27/2018   Obesity with body mass index 30 or greater 08/07/2016   Menorrhagia 08/03/2016   Smoker 08/03/2016   Past Medical History:  Diagnosis Date   Allergy    SEASONAL   Anemia    Anxiety    Arthritis    Depression    Gallstones 1998   GERD (gastroesophageal reflux disease)    Hyperlipidemia    Hypertension    Seizures (HCC)    AS A CHILD,BUT NOT AS A ADULT   Past Surgical History:  Procedure Laterality Date   CHOLECYSTECTOMY     CYSTOSCOPY Bilateral 02/25/2019   Procedure: CYSTOSCOPY;  Surgeon: Taam-Akelman, Rosalea K, MD;  Location: [facility];  Service: Gynecology;  Laterality: Bilateral;   ROBOTIC ASSISTED LAPAROSCOPIC HYSTERECTOMY AND SALPINGECTOMY Bilateral 02/25/2019   Procedure: XI ROBOTIC ASSISTED LAPAROSCOPIC HYSTERECTOMY AND SALPINGECTOMY;  Surgeon: Bonnielee Rayleen POUR, MD;  Location: [facility];  Service: Gynecology;  Laterality: Bilateral;   TUBAL LIGATION     WISDOM TOOTH EXTRACTION     Social History[1] Family History  Problem Relation Age of Onset   Stomach cancer Mother    Arthritis Mother    Hearing loss Mother    Hypertension Mother    Stomach cancer Sister    Hypertension Sister    Hypertension Sister    Hypertension Sister    Hypertension Sister    Hypertension Sister    Hypertension Brother    Hypertension Brother    Hypertension Brother    Hypertension Brother    Other Daughter        died in MVA   Colon cancer Neg Hx    Colon polyps Neg Hx    Esophageal cancer Neg Hx    Crohn's disease Neg Hx    Rectal cancer Neg Hx    Ulcerative colitis Neg Hx    Allergies[2]  ROS Negative unless stated above    Objective:     BP 124/88 (BP Location: Left Arm, Patient Position: Sitting, Cuff Size: Large)   Pulse (!) 105   Temp 98.8 F (37.1 C) (Oral)   Ht 5' 5 (1.651 m)   Wt 235 lb 9.6 oz (106.9 kg)   LMP 02/23/2019   SpO2 98%   BMI 39.21 kg/m  BP Readings from Last 3 Encounters:  03/13/24 124/88  02/24/24 130/88  02/07/24 124/71  Wt Readings from Last 3 Encounters:  03/13/24 235 lb 9.6 oz (106.9 kg)  02/24/24 239 lb (108.4 kg)  02/07/24 239 lb 12.8 oz (108.8 kg)      Physical Exam Constitutional:      General: She is not in acute distress.    Appearance: Normal appearance.  HENT:     Head: Normocephalic and atraumatic.     Nose: Nose normal.     Mouth/Throat:     Mouth: Mucous membranes are moist.  Cardiovascular:     Rate and Rhythm: Normal rate and regular rhythm.     Heart sounds: Normal heart sounds. No murmur heard.    No gallop.  Pulmonary:     Effort: Pulmonary effort is normal. No respiratory distress.     Breath sounds: Normal breath sounds. No wheezing, rhonchi or rales.  Musculoskeletal:     Cervical back: Normal.     Thoracic back: Normal.     Lumbar back: Normal.  Skin:    General: Skin is warm and dry.   Neurological:     Mental Status: She is alert and oriented to person, place, and time.     Coordination: Coordination is intact.        02/24/2024   11:12 AM 02/07/2024    9:57 AM 02/03/2024    2:37 PM  Depression screen PHQ 2/9  Decreased Interest 1 1 0  Down, Depressed, Hopeless 0 0 0  PHQ - 2 Score 1 1 0  Altered sleeping  2   Tired, decreased energy  3   Change in appetite  0   Feeling bad or failure about yourself   0   Trouble concentrating  0   Moving slowly or fidgety/restless  0   Suicidal thoughts  0   PHQ-9 Score  6       02/07/2024    9:57 AM 07/24/2023    3:43 PM 04/25/2023    1:14 PM 06/09/2020    3:33 PM  GAD 7 : Generalized Anxiety Score  Nervous, Anxious, on Edge 0 0 1 1  Control/stop worrying 1 0 1 1  Worry too much - different things 0 0 1 1  Trouble relaxing 2 0 1 1  Restless 1 0 0 1  Easily annoyed or irritable 1 0 1 1  Afraid - awful might happen 0 0 1 1  Total GAD 7 Score 5 0 6 7  Anxiety Difficulty Not difficult at all  Not difficult at all      No results found for any visits on 03/13/24.    Assessment & Plan:   Essential hypertension  Anxiety  Class 2 severe obesity with serious comorbidity and body mass index (BMI) of 39.0 to 39.9 in adult, unspecified obesity type   BP improving.  Continue HCTZ 12.5 mg in a.m. and spironolactone  25 mg in p.m. discussed importance of lifestyle modifications.  Continue to monitor.  Anxiety symptoms stable.  Advised medication and counseling go better together.  Given list of area [facility] providers.  Consider medication options for continued/worsening symptoms.  BMI 39.0 kg/m.  Patient encouraged to increase physical activity to help with HTN, back pain, and overall health.  Discussed modified exercises to prevent further strain on back.  Consider returning to PT.  Return if symptoms worsen or fail to improve.   Clotilda JONELLE Single, MD      [1]  Social History Tobacco Use   Smoking status: Every Day     Current packs/day:  0.25    Types: Cigarettes   Smokeless tobacco: Never  Vaping Use   Vaping status: Never Used  Substance Use Topics   Alcohol use: Yes    Comment: occ   Drug use: No  [2]  Allergies Allergen Reactions   Shellfish Allergy Anaphylaxis and Swelling   Lisinopril      Dry cough   "

## 2024-03-19 ENCOUNTER — Other Ambulatory Visit: Payer: Self-pay | Admitting: Family Medicine

## 2024-03-19 DIAGNOSIS — I10 Essential (primary) hypertension: Secondary | ICD-10-CM

## 2024-03-28 ENCOUNTER — Other Ambulatory Visit: Payer: Self-pay | Admitting: Physician Assistant

## 2024-03-30 NOTE — Telephone Encounter (Signed)
Called patient, no answer; unable to leave voice mail.

## 2024-03-31 ENCOUNTER — Encounter: Payer: Self-pay | Admitting: Student in an Organized Health Care Education/Training Program

## 2024-03-31 ENCOUNTER — Ambulatory Visit
Attending: Student in an Organized Health Care Education/Training Program | Admitting: Student in an Organized Health Care Education/Training Program

## 2024-03-31 VITALS — BP 122/85 | HR 96 | Temp 97.3°F | Resp 18 | Ht 65.0 in | Wt 235.0 lb

## 2024-03-31 DIAGNOSIS — M48061 Spinal stenosis, lumbar region without neurogenic claudication: Secondary | ICD-10-CM | POA: Insufficient documentation

## 2024-03-31 DIAGNOSIS — M5416 Radiculopathy, lumbar region: Secondary | ICD-10-CM | POA: Diagnosis not present

## 2024-03-31 DIAGNOSIS — G8929 Other chronic pain: Secondary | ICD-10-CM | POA: Insufficient documentation

## 2024-03-31 MED ORDER — PREGABALIN 25 MG PO CAPS: ORAL_CAPSULE | ORAL | 0 refills | Status: AC

## 2024-03-31 NOTE — Progress Notes (Signed)
 Safety precautions to be maintained throughout the outpatient stay will include: orient to surroundings, keep bed in low position, maintain call bell within reach at all times, provide assistance with transfer out of bed and ambulation.

## 2024-03-31 NOTE — Patient Instructions (Addendum)
 Please write work restrictions no significant bending, lifting, or twisting for the next 4 weeks.  Reach out to family regarding Rheumatoid arthritis

## 2024-03-31 NOTE — Progress Notes (Signed)
 PROVIDER NOTE: Interpretation of information contained herein should be left to medically-trained personnel. Specific patient instructions are provided elsewhere under Patient Instructions section of medical record. This document was created in part using AI and STT-dictation technology, any transcriptional errors that may result from this process are unintentional.  Patient: Erika Larson  Service: E/M   PCP: Mercer Clotilda SAUNDERS, MD  DOB: Nov 29, 1970  DOS: 03/31/2024  Provider: Wallie Sherry, MD  MRN: [id number]  Delivery: Face-to-face  Specialty: Interventional Pain Management  Type: Established Patient  Setting: Ambulatory outpatient facility  Specialty designation: 09  Referring Prov.: Mercer Clotilda SAUNDERS, MD  Location: Outpatient office facility       History of present illness (HPI) Erika Larson, a 53 y.o. year old female, is here today because of her Chronic radicular lumbar pain [M54.16, G89.29]. Erika Larson's primary complain today is Back Pain  Pertinent problems: Ms. Getty does not have any pertinent problems on file.  Pain Assessment: Severity of Chronic pain is reported as a 7 /10. Location: Back Lower/Radiates up mid back. Onset: More than a month ago. Quality: Tightness. Timing: Constant. Modifying factor(s): Rest. Vitals:  height is 5' 5 (1.651 m) and weight is 235 lb (106.6 kg). Her temporal temperature is 97.3 F (36.3 C) (abnormal). Her blood pressure is 122/85 and her pulse is 96. Her respiration is 18 and oxygen saturation is 97%.  BMI: Estimated body mass index is 39.11 kg/m as calculated from the following:   Height as of this encounter: 5' 5 (1.651 m).   Weight as of this encounter: 235 lb (106.6 kg).  Last encounter: 01/21/2024. Last procedure: 02/03/2024.  Reason for encounter:  Discussed the use of AI scribe software for clinical note transcription with the patient, who gave verbal consent to proceed.  History of Present Illness   Erika  Erika Larson is a 53 year old female with chronic low back pain who presents with difficulty standing and walking.  She experiences significant difficulty standing and walking, necessitating frequent sitting breaks. Her back pain is described as a tightening sensation, primarily on the left side, which exacerbates with prolonged standing or walking. She leaned against the elevator wall for support due to the pain when walking from the parking lot.  She has previously received injections for her back pain, which provided relief for four to five days, with the second injection being more effective than the first.  She was previously on gabapentin  but discontinued it due to elevated blood pressure. She has not tried Lyrica (pregabalin) before.  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  She has a family history of arthritis, with her mother and sisters experiencing similar issues at a young age.  Occasional trouble sleeping is noted, and she has not engaged in physical therapy recently due to persistent back pain.        ROS  Constitutional: Denies any fever or chills Gastrointestinal: No reported hemesis, hematochezia, vomiting, or acute GI distress Musculoskeletal: low back pain Neurological: No reported episodes of acute onset apraxia, aphasia, dysarthria, agnosia, amnesia, paralysis, loss of coordination, or loss of consciousness  Medication Review  Iron (Ferrous Sulfate ), Vitamin D , aspirin EC, gabapentin , hydrochlorothiazide , pregabalin, spironolactone , and tiZANidine   History Review  Allergy: Erika Larson is allergic to shellfish allergy and lisinopril . Drug: Erika Larson  reports no history of drug use. Alcohol:  reports current alcohol use. Tobacco:  reports that she has been  smoking cigarettes. She has never used smokeless tobacco. Social: Erika Larson  reports that she has been smoking cigarettes. She has  never used smokeless tobacco. She reports current alcohol use. She reports that she does not use drugs. Medical:  has a past medical history of Allergy, Anemia, Anxiety, Arthritis, Depression, Gallstones (1998), GERD (gastroesophageal reflux disease), Hyperlipidemia, Hypertension, and Seizures (HCC). Surgical: Erika Larson  has a past surgical history that includes Tubal ligation; Wisdom tooth extraction; Cholecystectomy; Robotic assisted laparoscopic hysterectomy and salpingectomy (Bilateral, 02/25/2019); and Cystoscopy (Bilateral, 02/25/2019). Family: family history includes Arthritis in her mother; Hearing loss in her mother; Hypertension in her brother, brother, brother, brother, mother, sister, sister, sister, sister, and sister; Other in her daughter; Stomach cancer in her mother and sister.  Laboratory Chemistry Profile   Renal Lab Results  Component Value Date   BUN 19 04/25/2023   CREATININE 0.83 04/25/2023   BCR NOT APPLICABLE 12/16/2019   GFR 81.67 04/25/2023   GFRAA >60 12/16/2019   GFRNONAA >60 12/16/2019    Hepatic Lab Results  Component Value Date   AST 17 04/25/2023   ALT 16 04/25/2023   ALBUMIN 4.4 04/25/2023   ALKPHOS 76 04/25/2023   LIPASE 23 09/27/2016    Electrolytes Lab Results  Component Value Date   NA 140 04/25/2023   K 3.7 04/25/2023   CL 102 04/25/2023   CALCIUM 9.4 04/25/2023    Bone Lab Results  Component Value Date   VD25OH 43.69 04/23/2022    Inflammation (CRP: Acute Phase) (ESR: Chronic Phase) Lab Results  Component Value Date   CRP 2.6 06/09/2020         Note: Above Lab results reviewed.  Recent Imaging Review  NCV with EMG(electromyography) Leigh Venetia CROME, MD     03/03/2024  9:28 AM  [facility]  [address] [place], [address]  [place], [place] [address] Tel: [phone] Fax: [phone] Test Date:  03/03/2024  Patient: Erika Larson DOB: 09-30-1970 Physician: Venetia Leigh,  MD  Sex: Female Height: 5' 5 Ref Phys:  Penne Sharps, MD  ID#: [id number]   Technician:    History: This is a 53 year old female with low back pain radiating into  her legs.  NCV & EMG Findings: Extensive electrodiagnostic evaluation of bilateral lower limbs  shows: Bilateral sural and superficial peroneal/fibular sensory  responses are within normal limits. Bilateral peroneal/fibular (EDB) and tibial (AH) motor responses  are within normal limits. Bilateral H reflex latency is within normal limits. There is no evidence of active or chronic motor axon loss changes  affecting any of the tested muscles. Motor unit configuration and  recruitment pattern is within normal limits.  Impression: This is a normal study of bilateral lower limbs. In particular,  there is no electrodiagnostic evidence of right or left  lumbosacral (L3-S1) radiculopathy, large fiber sensorimotor  neuropathy, or myopathy.  ___________________________ Venetia Leigh, MD  Nerve Conduction Studies Motor Nerve Results    Latency Amplitude F-Lat Segment Distance CV Comment  Site (ms) Norm (mV) Norm (ms)  (cm) (m/s) Norm   Left Fibular (EDB) Motor  Ankle 3.4  < 6.0 11.9  > 2.5        Bel fib head 9.1 - 11.7 -  Bel fib head-Ankle 30 53  > 40   Pop fossa 10.6 - 11.4 -  Pop fossa-Bel fib head 9 60 -   Right Fibular (EDB) Motor  Ankle 3.6  < 6.0 11.7  > 2.5  Bel fib head 8.6 - 10.9 -  Bel fib head-Ankle 29 58  > 40   Pop fossa 10.1 - 10.3 -  Pop fossa-Bel fib head 9 60 -   Left Tibial (AH) Motor  Ankle 3.8  < 6.0 15.8  > 4.0        Knee 11.3 - 12.4 -  Knee-Ankle 37 49  > 40   Right Tibial (AH) Motor  Ankle 3.7  < 6.0 12.1  > 4.0        Knee 11.5 - 8.9 -  Knee-Ankle 41 53  > 40    Sensory Sites    Neg Peak Lat Amplitude (O-P) Segment Distance Velocity Comment  Site (ms) Norm (V) Norm  (cm) (ms)   Left Superficial Fibular Sensory  14 cm-Ankle 2.8  < 4.6 16  > 4 14 cm-Ankle 14    Right Superficial Fibular Sensory  14 cm-Ankle 2.5  < 4.6 13   > 4 14 cm-Ankle 14    Left Sural Sensory  Calf-Lat mall 3.3  < 4.6 7  > 4 Calf-Lat mall 14    Right Sural Sensory  Calf-Lat mall 3.1  < 4.6 6  > 4 Calf-Lat mall 14     H-Reflex Results    M-Lat H Lat H Neg Amp H-M Lat  Site (ms) (ms) Norm (mV) (ms)  Left Tibial H-Reflex  Pop fossa 4.8 31.8  < 35.0 3.7 27.0  Right Tibial H-Reflex  Pop fossa 3.6 33.1  < 35.0 2.8 29.5   Electromyography   Side Muscle Ins.Act Fibs Fasc Recrt Amp Dur Poly Activation  Comment  Left Tib ant Nml Nml Nml Nml Nml Nml Nml Nml N/A  Left Gastroc MH Nml Nml Nml Nml Nml Nml Nml Nml N/A  Left Vastus lat Nml Nml Nml Nml Nml Nml Nml Nml N/A  Left Biceps fem SH Nml Nml Nml Nml Nml Nml Nml Nml N/A  Left Gluteus med Nml Nml Nml Nml Nml Nml Nml Nml N/A  Right Tib ant Nml Nml Nml Nml Nml Nml Nml Nml N/A  Right Gastroc MH Nml Nml Nml Nml Nml Nml Nml Nml N/A  Right Vastus lat Nml Nml Nml Nml Nml Nml Nml Nml N/A  Right Biceps fem SH Nml Nml Nml Nml Nml Nml Nml Nml N/A  Right Gluteus med Nml Nml Nml Nml Nml Nml Nml Nml N/A   Waveforms:  Motor         Sensory         H-Reflex     Note: Reviewed        Physical Exam  Vitals: BP 122/85 (BP Location: Left Arm, Patient Position: Sitting, Cuff Size: Normal)   Pulse 96   Temp (!) 97.3 F (36.3 C) (Temporal)   Resp 18   Ht 5' 5 (1.651 m)   Wt 235 lb (106.6 kg)   LMP 02/23/2019   SpO2 97%   BMI 39.11 kg/m  BMI: Estimated body mass index is 39.11 kg/m as calculated from the following:   Height as of this encounter: 5' 5 (1.651 m).   Weight as of this encounter: 235 lb (106.6 kg). Ideal: Ideal body weight: 57 kg (125 lb 10.6 oz) Adjusted ideal body weight: 76.8 kg (169 lb 6.4 oz) General appearance: Well nourished, well developed, and well hydrated. In no apparent acute distress Mental status: Alert, oriented x 3 (person, place, & time)       Respiratory: No evidence of acute respiratory distress  Eyes: PERLA   Assessment   Diagnosis  1. Chronic  radicular lumbar pain   2. Lumbar radiculopathy   3. Foraminal stenosis of lumbar region (left L4)      Updated Problems: No problems updated.  Plan of Care  Assessment and Plan    Lumbar radiculopathy   Chronic lumbar radiculopathy presents with left-sided pain, worsened by prolonged standing and walking. Previous EMG results showed no nerve entrapment or lesion. Gabapentin  was discontinued due to elevated blood pressure. Surgery is not recommended due to unfavorable risk-benefit ratio. Weight loss is advised to reduce spinal load. Neuromodulation may be considered if conservative measures fail. Initiate Lyrica (pregabalin) 25 mg at night for two weeks, then increase to 50 mg at night or 25 mg twice a day. Monitor blood pressure regularly. Avoid significant bending, lifting, or twisting for four weeks. Consider epidural injection if Lyrica is ineffective after four weeks. A full disability assessment has been facilitated through insurance.  Obesity   Obesity contributes to increased spinal load, exacerbating lumbar radiculopathy. Weight loss is recommended to alleviate symptoms. Encourage weight loss with a target of reducing weight by 3-4 pounds. Discussed potential benefits of weight loss on spinal health.        Erika Larson has a current medication list which includes the following long-term medication(s): gabapentin , hydrochlorothiazide , pregabalin, and spironolactone .  Pharmacotherapy (Medications Ordered): Meds ordered this encounter  Medications   pregabalin (LYRICA) 25 MG capsule    Sig: Take 1 capsule (25 mg total) by mouth at bedtime for 15 days, THEN 2 capsules (50 mg total) at bedtime.    Dispense:  105 capsule    Refill:  0   Orders:  No orders of the defined types were placed in this encounter.    Left L4 TF ESI 9/29- not helpful, left L4/5 IL [facility] 02/03/24   Return for PRN.    Recent Visits Date Type Provider Dept  02/24/24 Office Visit Patel,  Seema K, NP [facility]  02/03/24 Procedure visit Marcelino Nurse, MD [facility]  01/21/24 Office Visit Marcelino Nurse, MD [facility]  Showing recent visits within past 90 days and meeting all other requirements Today's Visits Date Type Provider Dept  03/31/24 Office Visit Marcelino Nurse, MD [facility]  Showing today's visits and meeting all other requirements Future Appointments No visits were found meeting these conditions. Showing future appointments within next 90 days and meeting all other requirements  I discussed the assessment and treatment plan with the patient. The patient was provided an opportunity to ask questions and all were answered. The patient agreed with the plan and demonstrated an understanding of the instructions.  Patient advised to call back or seek an in-person evaluation if the symptoms or condition worsens. I personally spent a total of 30 minutes in the care of the patient today including preparing to see the patient, getting/reviewing separately obtained history, performing a medically appropriate exam/evaluation, counseling and educating, placing orders, and documenting clinical information in the EHR.   Note by: Nurse Marcelino, MD (TTS and AI technology used. I apologize for any typographical errors that were not detected and corrected.) Date: 03/31/2024; Time: 10:23 AM

## 2024-04-25 ENCOUNTER — Other Ambulatory Visit: Payer: Self-pay | Admitting: Family Medicine

## 2024-04-25 DIAGNOSIS — I10 Essential (primary) hypertension: Secondary | ICD-10-CM
